# Patient Record
Sex: Female | Born: 1955 | Race: White | Hispanic: No | Marital: Married | Smoking: Former smoker
Health system: Southern US, Community
[De-identification: ages and names within clinical notes are randomized; demographics above are authoritative.]

## PROBLEM LIST (undated history)

## (undated) DIAGNOSIS — I10 Essential (primary) hypertension: Secondary | ICD-10-CM

## (undated) DIAGNOSIS — R011 Cardiac murmur, unspecified: Secondary | ICD-10-CM

## (undated) DIAGNOSIS — F419 Anxiety disorder, unspecified: Secondary | ICD-10-CM

## (undated) DIAGNOSIS — M1990 Unspecified osteoarthritis, unspecified site: Secondary | ICD-10-CM

## (undated) DIAGNOSIS — T7840XA Allergy, unspecified, initial encounter: Secondary | ICD-10-CM

## (undated) DIAGNOSIS — H269 Unspecified cataract: Secondary | ICD-10-CM

## (undated) DIAGNOSIS — I639 Cerebral infarction, unspecified: Secondary | ICD-10-CM

## (undated) DIAGNOSIS — E785 Hyperlipidemia, unspecified: Secondary | ICD-10-CM

## (undated) DIAGNOSIS — G250 Essential tremor: Secondary | ICD-10-CM

## (undated) DIAGNOSIS — K5904 Chronic idiopathic constipation: Secondary | ICD-10-CM

## (undated) HISTORY — DX: Essential tremor: G25.0

## (undated) HISTORY — DX: Anxiety disorder, unspecified: F41.9

## (undated) HISTORY — DX: Unspecified osteoarthritis, unspecified site: M19.90

## (undated) HISTORY — DX: Chronic idiopathic constipation: K59.04

## (undated) HISTORY — DX: Hyperlipidemia, unspecified: E78.5

## (undated) HISTORY — DX: Cardiac murmur, unspecified: R01.1

## (undated) HISTORY — DX: Allergy, unspecified, initial encounter: T78.40XA

## (undated) HISTORY — DX: Cerebral infarction, unspecified: I63.9

## (undated) HISTORY — PX: OTHER SURGICAL HISTORY: SHX169

## (undated) HISTORY — PX: APPENDECTOMY: SHX54

## (undated) HISTORY — DX: Essential (primary) hypertension: I10

## (undated) HISTORY — DX: Unspecified cataract: H26.9

## (undated) HISTORY — PX: UPPER GASTROINTESTINAL ENDOSCOPY: SHX188

---

## 1998-05-28 ENCOUNTER — Other Ambulatory Visit: Admission: RE | Admit: 1998-05-28 | Discharge: 1998-05-28 | Payer: Self-pay | Admitting: Obstetrics and Gynecology

## 1999-08-12 ENCOUNTER — Other Ambulatory Visit: Admission: RE | Admit: 1999-08-12 | Discharge: 1999-08-12 | Payer: Self-pay | Admitting: Obstetrics and Gynecology

## 2009-10-08 ENCOUNTER — Ambulatory Visit (HOSPITAL_COMMUNITY): Admission: EM | Admit: 2009-10-08 | Discharge: 2009-10-09 | Payer: Self-pay | Admitting: Emergency Medicine

## 2009-10-08 ENCOUNTER — Encounter (INDEPENDENT_AMBULATORY_CARE_PROVIDER_SITE_OTHER): Payer: Self-pay

## 2009-12-21 ENCOUNTER — Ambulatory Visit: Payer: Self-pay | Admitting: Internal Medicine

## 2010-03-04 ENCOUNTER — Ambulatory Visit: Payer: Self-pay | Admitting: Internal Medicine

## 2010-03-04 ENCOUNTER — Other Ambulatory Visit: Admission: RE | Admit: 2010-03-04 | Discharge: 2010-03-04 | Payer: Self-pay | Admitting: Internal Medicine

## 2010-03-04 LAB — HM PAP SMEAR

## 2010-03-09 ENCOUNTER — Encounter: Admission: RE | Admit: 2010-03-09 | Discharge: 2010-03-09 | Payer: Self-pay | Admitting: Internal Medicine

## 2010-03-09 LAB — HM MAMMOGRAPHY

## 2010-09-06 LAB — POCT I-STAT, CHEM 8
BUN: 14 mg/dL (ref 6–23)
Calcium, Ion: 1 mmol/L — ABNORMAL LOW (ref 1.12–1.32)
Creatinine, Ser: 0.6 mg/dL (ref 0.4–1.2)
Hemoglobin: 15 g/dL (ref 12.0–15.0)

## 2010-09-06 LAB — DIFFERENTIAL
Basophils Relative: 0 % (ref 0–1)
Lymphocytes Relative: 8 % — ABNORMAL LOW (ref 12–46)
Lymphs Abs: 1.5 10*3/uL (ref 0.7–4.0)
Monocytes Absolute: 0.2 10*3/uL (ref 0.1–1.0)
Monocytes Relative: 1 % — ABNORMAL LOW (ref 3–12)
Neutro Abs: 16 10*3/uL — ABNORMAL HIGH (ref 1.7–7.7)
Neutrophils Relative %: 90 % — ABNORMAL HIGH (ref 43–77)

## 2010-09-06 LAB — BASIC METABOLIC PANEL
CO2: 26 mEq/L (ref 19–32)
Chloride: 107 mEq/L (ref 96–112)
GFR calc Af Amer: >60 mL/min (ref >60)
Potassium: 3.5 mEq/L (ref 3.5–5.1)
Sodium: 141 mEq/L (ref 135–145)

## 2010-09-06 LAB — URINE MICROSCOPIC-ADD ON

## 2010-09-06 LAB — URINALYSIS, ROUTINE W REFLEX MICROSCOPIC
Hgb urine dipstick: NEGATIVE
Ketones, ur: 15 mg/dL — AB
Urobilinogen, UA: 0.2 mg/dL (ref 0.0–1.0)

## 2010-09-06 LAB — CBC
RBC: 4.55 MIL/uL (ref 3.87–5.11)
RDW: 12.9 % (ref 11.5–15.5)

## 2010-09-06 LAB — LACTIC ACID, PLASMA: Lactic Acid, Venous: 1.8 mmol/L (ref 0.5–2.2)

## 2010-09-06 LAB — LIPASE, BLOOD: Lipase: 25 U/L (ref 11–59)

## 2011-08-15 ENCOUNTER — Encounter: Payer: Self-pay | Admitting: Internal Medicine

## 2011-08-15 ENCOUNTER — Ambulatory Visit (INDEPENDENT_AMBULATORY_CARE_PROVIDER_SITE_OTHER): Payer: PRIVATE HEALTH INSURANCE | Admitting: Internal Medicine

## 2011-08-15 VITALS — BP 136/86 | Temp 98.3°F | Wt 144.0 lb

## 2011-08-15 DIAGNOSIS — K5904 Chronic idiopathic constipation: Secondary | ICD-10-CM | POA: Insufficient documentation

## 2011-08-15 DIAGNOSIS — G250 Essential tremor: Secondary | ICD-10-CM | POA: Insufficient documentation

## 2011-08-15 DIAGNOSIS — Z8669 Personal history of other diseases of the nervous system and sense organs: Secondary | ICD-10-CM | POA: Insufficient documentation

## 2011-08-15 DIAGNOSIS — N3090 Cystitis, unspecified without hematuria: Secondary | ICD-10-CM

## 2011-08-15 DIAGNOSIS — R300 Dysuria: Secondary | ICD-10-CM

## 2011-08-15 LAB — POCT URINALYSIS DIPSTICK
Blood, UA: NEGATIVE
Leukocytes, UA: NEGATIVE
Spec Grav, UA: <=1.005
pH, UA: 5

## 2011-08-15 NOTE — Patient Instructions (Signed)
Take Macrobid 100 mg twice daily for 10 days or urinary tract infection.

## 2011-08-15 NOTE — Progress Notes (Signed)
  Subjective:    Patient ID: Tracie Roach, female    DOB: December 22, 1955, 56 y.o.   MRN: [id number]  HPI Onset early last evening of UTI symptoms. Patient had dysuria and pink team Aurea Graff: Paper when she wiped. No fever or chills. No groin or flank pain. Very mild back pain and slight suprapubic pressure. Patient took 2 Macrobid tablets left over from GYN physician last evening. Feels better today. Urinalysis this morning is completely clear. Has not had UTI in several years.    Review of Systems     Objective:   Physical Exam no CVA tenderness. Urine culture not done because patient had had 2 doses of Macrobid.        Assessment & Plan:  Cystitis  Plan: Macrobid 100 mg twice daily for 10 days.

## 2012-06-19 DIAGNOSIS — I639 Cerebral infarction, unspecified: Secondary | ICD-10-CM

## 2012-06-19 HISTORY — DX: Cerebral infarction, unspecified: I63.9

## 2012-06-28 ENCOUNTER — Encounter (HOSPITAL_COMMUNITY): Payer: Self-pay | Admitting: Emergency Medicine

## 2012-06-28 ENCOUNTER — Inpatient Hospital Stay (HOSPITAL_COMMUNITY)
Admission: EM | Admit: 2012-06-28 | Discharge: 2012-06-30 | DRG: 066 | Disposition: A | Discharge status: Home / Self Care | Payer: No Typology Code available for payment source | Attending: Family Medicine | Admitting: Family Medicine

## 2012-06-28 ENCOUNTER — Encounter: Payer: Self-pay | Admitting: Internal Medicine

## 2012-06-28 ENCOUNTER — Ambulatory Visit (INDEPENDENT_AMBULATORY_CARE_PROVIDER_SITE_OTHER): Payer: PRIVATE HEALTH INSURANCE | Admitting: Internal Medicine

## 2012-06-28 ENCOUNTER — Emergency Department (HOSPITAL_COMMUNITY): Payer: No Typology Code available for payment source

## 2012-06-28 VITALS — BP 152/86 | HR 80 | Temp 98.3°F | Wt 154.0 lb

## 2012-06-28 DIAGNOSIS — I6381 Other cerebral infarction due to occlusion or stenosis of small artery: Secondary | ICD-10-CM

## 2012-06-28 DIAGNOSIS — Z79899 Other long term (current) drug therapy: Secondary | ICD-10-CM

## 2012-06-28 DIAGNOSIS — Z87891 Personal history of nicotine dependence: Secondary | ICD-10-CM

## 2012-06-28 DIAGNOSIS — E876 Hypokalemia: Secondary | ICD-10-CM | POA: Diagnosis present

## 2012-06-28 DIAGNOSIS — G8194 Hemiplegia, unspecified affecting left nondominant side: Secondary | ICD-10-CM

## 2012-06-28 DIAGNOSIS — I6350 Cerebral infarction due to unspecified occlusion or stenosis of unspecified cerebral artery: Principal | ICD-10-CM

## 2012-06-28 DIAGNOSIS — Z7982 Long term (current) use of aspirin: Secondary | ICD-10-CM

## 2012-06-28 DIAGNOSIS — Z8669 Personal history of other diseases of the nervous system and sense organs: Secondary | ICD-10-CM

## 2012-06-28 DIAGNOSIS — Z9089 Acquired absence of other organs: Secondary | ICD-10-CM

## 2012-06-28 DIAGNOSIS — G43909 Migraine, unspecified, not intractable, without status migrainosus: Secondary | ICD-10-CM | POA: Diagnosis present

## 2012-06-28 DIAGNOSIS — G8190 Hemiplegia, unspecified affecting unspecified side: Secondary | ICD-10-CM

## 2012-06-28 DIAGNOSIS — E785 Hyperlipidemia, unspecified: Secondary | ICD-10-CM

## 2012-06-28 DIAGNOSIS — I639 Cerebral infarction, unspecified: Secondary | ICD-10-CM

## 2012-06-28 LAB — PROTIME-INR: Prothrombin Time: 12.9 seconds (ref 11.6–15.2)

## 2012-06-28 LAB — GLUCOSE, CAPILLARY: Glucose-Capillary: 112 mg/dL — ABNORMAL HIGH (ref 70–99)

## 2012-06-28 LAB — CBC WITH DIFFERENTIAL/PLATELET
Basophils Relative: 0 % (ref 0–1)
HCT: 39.4 % (ref 36.0–46.0)
Hemoglobin: 12.6 g/dL (ref 12.0–15.0)
Lymphs Abs: 3 10*3/uL (ref 0.7–4.0)
Monocytes Absolute: 0.4 10*3/uL (ref 0.1–1.0)
Neutro Abs: 4.8 10*3/uL (ref 1.7–7.7)
Neutrophils Relative %: 56 % (ref 43–77)
WBC: 8.5 10*3/uL (ref 4.0–10.5)

## 2012-06-28 LAB — BASIC METABOLIC PANEL
Calcium: 9.6 mg/dL (ref 8.4–10.5)
Creatinine, Ser: 0.71 mg/dL (ref 0.50–1.10)
GFR calc Af Amer: >90 mL/min (ref >90)

## 2012-06-28 NOTE — ED Notes (Signed)
Family at bedside. 

## 2012-06-28 NOTE — ED Notes (Signed)
Pt states she has numbness and tingling on her whole left side.

## 2012-06-28 NOTE — ED Notes (Signed)
Patient transported to MRI 

## 2012-06-28 NOTE — ED Provider Notes (Signed)
History     CSN: [id number]  Arrival date & time 06/28/12  1659   First MD Initiated Contact with Patient 06/28/12 2004      Chief Complaint  Patient presents with  . Numbness    (Consider location/radiation/quality/duration/timing/severity/associated sxs/prior treatment) HPI  Pt presents to the ED from Dr. Beryle Quant office for evaluation for a possible CVA. The patient describes a  sensation of numbness and tightness in the left axilla and shoulder. She says that this started 3-4 days  ago. Since then over the past day she feels as though she has been having left lower tremor the  numbness and tingling of well with some mild weakness. The patient tells me that she is able to work and  is ambulatory. She says "I'm able to get down everything that I need to get done" but that the numbness  and tingling with weakness has her very concerned. She has a history of smoking for which she quit  recently. Patient also has a past medical history of migraines but denies currently having any pain  anywhere or headache. She's afebrile with no temperature. Her vital signs are stable and she is in no  acute distress.  Past Medical History  Diagnosis Date  . Headache   . Functional constipation   . Hot flashes   . Postmenopausal   . Essential tremor     Past Surgical History  Procedure Date  . Appendectomy     Family History  Problem Relation Age of Onset  . Heart disease Mother     History  Substance Use Topics  . Smoking status: Former Smoker -- 0.3 packs/day    Quit date: 08/27/2011  . Smokeless tobacco: Never Used  . Alcohol Use: No    OB History    Grav Para Term Preterm Abortions TAB SAB Ect Mult Living                  Review of Systems  Review of Systems  Gen: no weight loss, fevers, chills, night sweats  Eyes: no discharge or drainage, no occular pain or visual changes  Nose: no epistaxis or rhinorrhea  Mouth: no dental pain, no sore throat  Neck: no neck  pain  Lungs:No wheezing, coughing or hemoptysis CV: no chest pain, palpitations, dependent edema or orthopnea  Abd: no abdominal pain, nausea, vomiting  GU: no dysuria or gross hematuria  MSK:  No abnormalities  Neuro: + headaches, + weakness and numbness on left side of body Skin: no abnormalities Psyche: negative.   Allergies  Review of patient's allergies indicates no known allergies.  Home Medications   Current Outpatient Rx  Name  Route  Sig  Dispense  Refill  . ASPIRIN-ACETAMINOPHEN-CAFFEINE 250-250-65 MG PO TABS   Oral   Take 1 tablet by mouth every 6 (six) hours as needed. For pain         . BUTALBITAL-APAP-CAFFEINE 50-325-40 MG PO TABS   Oral   Take 1 tablet by mouth 2 (two) times daily as needed. For migraines         . MULTI-VITAMIN/MINERALS PO TABS   Oral   Take 1 tablet by mouth daily.           BP 145/80  Pulse 78  Temp 98.4 F (36.9 C) (Oral)  Resp 14  SpO2 100%  Physical Exam  Nursing note and vitals reviewed. Constitutional: She is oriented to person, place, and time. She appears well-developed and well-nourished. No distress.  HENT:  Head: Normocephalic and atraumatic.  Eyes: Pupils are equal, round, and reactive to light.  Neck: Normal range of motion. Neck supple.  Cardiovascular: Normal rate and regular rhythm.   Pulmonary/Chest: Effort normal.  Abdominal: Soft.  Neurological: She is alert and oriented to person, place, and time. No cranial nerve deficit or sensory deficit. Gait normal.       Strength is symmetrical except for very mild weakness noted in left leg.  Skin: Skin is warm and dry.    ED Course  Procedures (including critical care time)  Labs Reviewed  GLUCOSE, CAPILLARY - Abnormal; Notable for the following:    Glucose-Capillary 112 (*)     All other components within normal limits  BASIC METABOLIC PANEL - Abnormal; Notable for the following:    Potassium 3.4 (*)     All other components within normal limits  CBC  WITH DIFFERENTIAL  PROTIME-INR   Mr Shirlee Latch Wo Contrast  06/28/2012  *RADIOLOGY REPORT*  Clinical Data:  57 year old female with numbness, left side hemipareses.  Left face numbness progressing to left hand numbness times 3 days.  Comparison: None.  MRI HEAD WITHOUT CONTRAST  Technique: Multiplanar, multiecho pulse sequences of the brain and surrounding structures were obtained according to standard protocol without intravenous contrast.  Findings: Linear 6 mm focus of restricted diffusion at the lateral aspect of the right thalamus near the posterior limb right internal capsule.  Associated T2 and FLAIR hyperintensity.  No mass effect or hemorrhage.  Major intracranial vascular flow voids are preserved, MRA findings are below.  [name] Normal cerebral volume.  No ventriculomegaly.  Negative pituitary, cervicomedullary junction and visualized cervical spine. Visualized bone marrow signal is within normal limits.  Mild paranasal sinus mucosal thickening. Visualized orbit soft tissues are within normal limits.  Mastoids are clear.  Negative scalp soft tissues.  IMPRESSION: 1.  Acute lacunar infarct lateral right thalamus near the posterior limb right internal capsule.  No mass effect or hemorrhage. 2. Otherwise negative noncontrast brain MRI. 3.  MRA findings are below.  MRA HEAD WITHOUT CONTRAST  Technique: Angiographic images of the Circle of Willis were obtained using MRA technique without  intravenous contrast.  Findings: Antegrade flow in the posterior circulation codominant distal vertebral arteries.  No distal vertebral artery stenosis. Normal bilateral PICA vessels.  Patent vertebrobasilar junction. Normal basilar artery.  SCA and PCA origins are normal.  Posterior communicating arteries are diminutive or absent.  Right PCA P1 and P2 segments are within normal limits.  Bilateral PCA branches are within normal limits.  Antegrade flow in both ICA  siphons.  Mild irregularity of the distal cervical right ICA.  No ICA siphon stenosis.  Normal ophthalmic artery origins.  Normal carotid termini, MCA and ACA origins.  Anterior communicating artery diminutive or absent.  Visualized ACA branches are within normal limits.  Visualized bilateral MCA branches are within normal limits.  IMPRESSION: 1. Negative intracranial MRA. 2.  Irregularity of the visible distal cervical right ICA which may represent atherosclerosis.   Original Report Authenticated By: Erskine Speed, M.D.    Mr Brain Wo Contrast  06/28/2012  *RADIOLOGY REPORT*  Clinical Data:  57 year old female with numbness, left side hemipareses.  Left face numbness progressing to left hand numbness times 3 days.  Comparison: None.  MRI HEAD WITHOUT CONTRAST  Technique: Multiplanar, multiecho pulse sequences of the brain and surrounding structures were obtained according to standard protocol without intravenous contrast.  Findings:  Linear 6 mm focus of restricted diffusion at the lateral aspect of the right thalamus near the posterior limb right internal capsule.  Associated T2 and FLAIR hyperintensity.  No mass effect or hemorrhage.  Major intracranial vascular flow voids are preserved, MRA findings are below.  [name] Normal cerebral volume.  No ventriculomegaly.  Negative pituitary, cervicomedullary junction and visualized cervical spine. Visualized bone marrow signal is within normal limits.  Mild paranasal sinus mucosal thickening. Visualized orbit soft tissues are within normal limits.  Mastoids are clear.  Negative scalp soft tissues.  IMPRESSION: 1.  Acute lacunar infarct lateral right thalamus near the posterior limb right internal capsule.  No mass effect or hemorrhage. 2. Otherwise negative noncontrast brain MRI. 3.  MRA findings are below.  MRA HEAD WITHOUT CONTRAST  Technique: Angiographic images of the Circle of Willis were obtained using MRA  technique without  intravenous contrast.  Findings: Antegrade flow in the posterior circulation codominant distal vertebral arteries.  No distal vertebral artery stenosis. Normal bilateral PICA vessels.  Patent vertebrobasilar junction. Normal basilar artery.  SCA and PCA origins are normal.  Posterior communicating arteries are diminutive or absent.  Right PCA P1 and P2 segments are within normal limits.  Bilateral PCA branches are within normal limits.  Antegrade flow in both ICA siphons.  Mild irregularity of the distal cervical right ICA.  No ICA siphon stenosis.  Normal ophthalmic artery origins.  Normal carotid termini, MCA and ACA origins.  Anterior communicating artery diminutive or absent.  Visualized ACA branches are within normal limits.  Visualized bilateral MCA branches are within normal limits.  IMPRESSION: 1. Negative intracranial MRA. 2.  Irregularity of the visible distal cervical right ICA which may represent atherosclerosis.   Original Report Authenticated By: Erskine Speed, M.D.      1. Lacunar infarct, acute       MDM  After discussing case with Dr. Oletta Lamas, MR brain wo contrast ordered to r/o cva or other emergent intracranial pathology. Symptoms have persisted to 3-4 days.  MRI shows a small lacunar infarct of the brain. Pt admitted to [facility] and neurology to consult. I have told the family the results and that she is to be admitted.      Dorthula Matas, PA 06/28/12 2322

## 2012-06-28 NOTE — Progress Notes (Addendum)
Subjective:    Patient ID: Tracie Roach, female    DOB: 1955-09-20, 57 y.o.   MRN: [id number]  HPI Pt had onset of URI symptoms on Christmas Eve. Had cough and congestion. Pt. Took Sudafed and slowly improved. Pt has history of migraine headaches. Is seen here infrequently with no recent exam or lab work. Around [place] had bad headache and persisted for 2-3 days for which she took Excedrin Migraine. History of present illness is a bit vague but maybe on 1/6 pt noted numbness of LUE intermittent with hand numbness and arm numbness. Face began to tingle 2 days ago. Left sided facial numbness. Pt c/o numbness and tightness in left axilla and shoulder. Also has LLE numbness that started 06/25/12. Mother is in [facility]  with dehydration, bronchitis and Protime abnormality on Coumadin.  No documented fever or chills. Has hot flashes from menopause. No visual disturbance. No sore throat. Took flu vaccine at [facility] end of October.   No known drug allergies. Takes occasional Fioricet and Excedrin migraine. History of constipation. History of essential tremor.  Had appendectomy April 2011. Last mammogram on file October 2011. Had Pap smear which was normal September 2011. History of moderate hyperlipidemia with LDL cholesterol 140 02/23/2010 with total cholesterol 213 HDL cholesterol 47 triglycerides of 93. TSH was normal at that time.  History of occasional urinary tract infections.  Family history: Father died in 11-10-2007 with pulmonary fibrosis. Mother living with history of CABG and valve replacement. One sister in good health. No brothers.  Social history: Married   with 2 adult children  Does not smoke or consume alcohol. Quit smoking March 2013. Used to smoke 6 or 7 cigarettes daily.  Has been a patient in this practice since March 2000 that is seen infrequently. No history of serious illnesses or accidents.    Review of Systems  Constitutional:       Calm alert and oriented    HENT: Positive for congestion.   Eyes: Negative.   Gastrointestinal:       Constipation  Genitourinary: Negative.   Musculoskeletal: Positive for arthralgias.  Neurological: Positive for weakness, numbness and headaches.  Psychiatric/Behavioral: Negative.        Objective:   Physical Exam  Vitals reviewed. Constitutional: She is oriented to person, place, and time. She appears well-developed and well-nourished. No distress.  HENT:  Head: Normocephalic and atraumatic.  Right Ear: External ear normal.  Left Ear: External ear normal.  Mouth/Throat: Oropharynx is clear and moist. No oropharyngeal exudate.       Bilateral lenticular densities. Fundi not well seen. EOM's full PERLA  Eyes: Conjunctivae normal and EOM are normal. Pupils are equal, round, and reactive to light. Right eye exhibits no discharge. Left eye exhibits no discharge. No scleral icterus.  Neck: Neck supple. No JVD present. No thyromegaly present.  Cardiovascular: Normal rate, regular rhythm, normal heart sounds and intact distal pulses.   No murmur heard. Pulmonary/Chest: No respiratory distress. She has no wheezes. She has no rales. She exhibits no tenderness.       Breasts normal female.  Abdominal: Soft. Bowel sounds are normal. She exhibits no distension and no mass. There is no tenderness. There is no rebound and no guarding.  Musculoskeletal: Normal range of motion. She exhibits no edema.  Lymphadenopathy:    She has no cervical adenopathy.  Neurological: She is alert and oriented to person, place, and time. She has normal reflexes. No cranial nerve deficit.  Skin: Skin is warm and dry. She is not diaphoretic.  Psychiatric: She has a normal mood and affect. Her behavior is normal. Judgment and thought content normal.          Assessment & Plan:  Left sided weakness including LLE, left trunk, LUE and left face- possible CVA Symtoms started several days ago and have progressed to involve LLE History of  migraine headaches  Plan: To ED for further evaluation by Neurohospitalist and imaging studies. No labs drawn here today.  Patient diagnosed in hospital with right thalamic stroke. Was admitted for evaluation. Had LDL of 132. Normal hemoglobin A1c. Patient advised to followup with me in 2 weeks. She also has an appointment to followup with Dr. Pearlean Brownie in 2 months.

## 2012-06-28 NOTE — Patient Instructions (Addendum)
Proceed to emergency department for immediate evaluation. 

## 2012-06-28 NOTE — ED Notes (Signed)
Pt sent to ED from Dr. Beryle Quant office with c/o weakness and numbness in left side of face, trunk and left lower leg.  Pt has hx of migraines and st's headache comes and goes.  Pt alert and oriented x's 3. Speech clear.

## 2012-06-28 NOTE — ED Provider Notes (Signed)
Medical screening examination/treatment/procedure(s) were conducted as a shared visit with non-physician practitioner(s) and myself.  I personally evaluated the patient during the encounter   Pt with 3-4 days of HA.  MRI shows lacunar infarct.  Not a TPA candidate due to mild symptoms, several days prior to onset.  Will need admission for stroke eval and neurology consultation.  Airway intact.    Gavin Pound. Oletta Lamas, MD 06/28/12 2324

## 2012-06-29 ENCOUNTER — Encounter (HOSPITAL_COMMUNITY): Payer: Self-pay | Admitting: *Deleted

## 2012-06-29 DIAGNOSIS — I6789 Other cerebrovascular disease: Secondary | ICD-10-CM

## 2012-06-29 DIAGNOSIS — E785 Hyperlipidemia, unspecified: Secondary | ICD-10-CM

## 2012-06-29 LAB — LIPID PANEL
HDL: 43 mg/dL (ref >39)
LDL Cholesterol: 136 mg/dL — ABNORMAL HIGH (ref 0–99)
Total CHOL/HDL Ratio: 4.7 RATIO
Triglycerides: 112 mg/dL (ref <150)

## 2012-06-29 LAB — CBC
HCT: 39.4 % (ref 36.0–46.0)
MCHC: 33.5 g/dL (ref 30.0–36.0)
Platelets: 354 10*3/uL (ref 150–400)
RDW: 12.7 % (ref 11.5–15.5)
WBC: 9.6 10*3/uL (ref 4.0–10.5)

## 2012-06-29 LAB — CREATININE, SERUM: GFR calc non Af Amer: >90 mL/min (ref >90)

## 2012-06-29 LAB — HEMOGLOBIN A1C: Hgb A1c MFr Bld: 5.6 % (ref <5.7)

## 2012-06-29 MED ORDER — ACETAMINOPHEN 325 MG PO TABS
650.0000 mg | ORAL_TABLET | Freq: Four times a day (QID) | ORAL | Status: DC | PRN
  Administered 2012-06-29: 650 mg via ORAL
  Filled 2012-06-29: qty 2

## 2012-06-29 MED ORDER — FAMOTIDINE 20 MG PO TABS
20.0000 mg | ORAL_TABLET | Freq: Every day | ORAL | Status: DC
  Administered 2012-06-29: 20 mg via ORAL
  Filled 2012-06-29 (×2): qty 1

## 2012-06-29 MED ORDER — POTASSIUM CHLORIDE CRYS ER 20 MEQ PO TBCR
20.0000 meq | EXTENDED_RELEASE_TABLET | Freq: Two times a day (BID) | ORAL | Status: DC
  Administered 2012-06-29 – 2012-06-30 (×3): 20 meq via ORAL
  Filled 2012-06-29 (×5): qty 1

## 2012-06-29 MED ORDER — ASPIRIN EC 325 MG PO TBEC
325.0000 mg | DELAYED_RELEASE_TABLET | Freq: Every day | ORAL | Status: DC
  Administered 2012-06-29 – 2012-06-30 (×2): 325 mg via ORAL
  Filled 2012-06-29 (×2): qty 1

## 2012-06-29 MED ORDER — HEPARIN SODIUM (PORCINE) 5000 UNIT/ML IJ SOLN
5000.0000 [IU] | Freq: Three times a day (TID) | INTRAMUSCULAR | Status: DC
  Administered 2012-06-29 – 2012-06-30 (×3): 5000 [IU] via SUBCUTANEOUS
  Filled 2012-06-29 (×7): qty 1

## 2012-06-29 MED ORDER — SIMVASTATIN 20 MG PO TABS
20.0000 mg | ORAL_TABLET | Freq: Every day | ORAL | Status: DC
  Administered 2012-06-29: 20 mg via ORAL
  Filled 2012-06-29 (×2): qty 1

## 2012-06-29 MED ORDER — PANTOPRAZOLE SODIUM 40 MG PO TBEC
40.0000 mg | DELAYED_RELEASE_TABLET | Freq: Every day | ORAL | Status: DC
  Administered 2012-06-29 – 2012-06-30 (×2): 40 mg via ORAL
  Filled 2012-06-29 (×2): qty 1

## 2012-06-29 MED ORDER — MULTI-VITAMIN/MINERALS PO TABS: 1.0000 | ORAL_TABLET | Freq: Every day | ORAL | Status: DC

## 2012-06-29 MED ORDER — ADULT MULTIVITAMIN W/MINERALS CH
1.0000 | ORAL_TABLET | Freq: Every day | ORAL | Status: DC
  Administered 2012-06-29 – 2012-06-30 (×2): 1 via ORAL
  Filled 2012-06-29 (×2): qty 1

## 2012-06-29 MED ORDER — HYDROCODONE-ACETAMINOPHEN 5-325 MG PO TABS
1.0000 | ORAL_TABLET | Freq: Once | ORAL | Status: AC
  Administered 2012-06-29: 1 via ORAL
  Filled 2012-06-29: qty 1

## 2012-06-29 MED ORDER — POTASSIUM CHLORIDE CRYS ER 20 MEQ PO TBCR
20.0000 meq | EXTENDED_RELEASE_TABLET | Freq: Two times a day (BID) | ORAL | Status: DC
  Filled 2012-06-29 (×3): qty 1

## 2012-06-29 MED ORDER — POLYETHYLENE GLYCOL 3350 17 G PO PACK
17.0000 g | PACK | Freq: Every day | ORAL | Status: DC
  Administered 2012-06-29 – 2012-06-30 (×2): 17 g via ORAL
  Filled 2012-06-29 (×2): qty 1

## 2012-06-29 MED ORDER — DOCUSATE SODIUM 100 MG PO CAPS
100.0000 mg | ORAL_CAPSULE | Freq: Two times a day (BID) | ORAL | Status: DC
  Administered 2012-06-29 – 2012-06-30 (×2): 100 mg via ORAL

## 2012-06-29 NOTE — H&P (Signed)
[facility] History and Physical  Tracie Roach WJX:[id number] DOB: [birth year] DOA: 06/28/2012  Referring physician: ED PCP: Margaree Mackintosh, MD  Specialists: None  Chief Complaint: Numbness  HPI: Tracie Roach is a 57 y.o. female who presents to the ED with L sided numbness going on now for the past 3-4 days.  Initially waxing and waning but has been fixed and persistent over the past 2 days.  Because of its persistence she presented to the ED.  No weakness, vision change, or any other symptoms with this.  In the ED MRI was performed which demonstrated a small thalamic infarct, hospitalist has been asked to admit for stroke.  Review of Systems: 12 systems reviewed and negative  Past Medical History  Diagnosis Date  . Headache   . Functional constipation   . Hot flashes   . Postmenopausal   . Essential tremor    Past Surgical History  Procedure Date  . Appendectomy    Social History:  reports that she quit smoking about 10 months ago. She has never used smokeless tobacco. She reports that she does not drink alcohol or use illicit drugs. In total has only about a 6-7 pack year history of smoking.  No Known Allergies  Family History  Problem Relation Age of Onset  . Heart disease Mother      Prior to Admission medications   Medication Sig Start Date End Date Taking? Authorizing Provider  aspirin-acetaminophen-caffeine (EXCEDRIN MIGRAINE) [phone] MG per tablet Take 1 tablet by mouth every 6 (six) hours as needed. For pain   Yes Historical Provider, MD  butalbital-acetaminophen-caffeine (FIORICET, ESGIC) 50-325-40 MG per tablet Take 1 tablet by mouth 2 (two) times daily as needed. For migraines   Yes Historical Provider, MD  Multiple Vitamins-Minerals (MULTIVITAMIN WITH MINERALS) tablet Take 1 tablet by mouth daily.   Yes Historical Provider, MD   Physical Exam: Filed Vitals:   06/29/12 0115 06/29/12 0132 06/29/12 0133 06/29/12 0133  BP: 135/65  130/70   Pulse: 75  75 68   Temp:    97.5 F (36.4 C)  TempSrc:      Resp: 16     SpO2: 100% 100% 100%     General:  NAD, resting comfortably in bed Eyes: PEERLA EOMI ENT: mucous membranes moist Neck: supple w/o JVD Cardiovascular: RRR w/o MRG Respiratory: CTA B Abdomen: soft, nt, nd, bs+ Skin: no rash nor lesion Musculoskeletal: MAE, full ROM all 4 extremities Psychiatric: normal tone and affect Neurologic: AAOx3, sensation diminished in the L side (face, arm, and leg) compared to the R, otherwise exam is non-focal  Labs on Admission:  Basic Metabolic Panel:  Lab 06/28/12 8469  NA 142  K 3.4*  CL 103  CO2 27  GLUCOSE 85  BUN 9  CREATININE 0.71  CALCIUM 9.6  MG --  PHOS --   Liver Function Tests: No results found for this basename: AST:5,ALT:5,ALKPHOS:5,BILITOT:5,PROT:5,ALBUMIN:5 in the last 168 hours No results found for this basename: LIPASE:5,AMYLASE:5 in the last 168 hours No results found for this basename: AMMONIA:5 in the last 168 hours CBC:  Lab 06/28/12 2054  WBC 8.5  NEUTROABS 4.8  HGB 12.6  HCT 39.4  MCV 90.0  PLT 346   Cardiac Enzymes: No results found for this basename: CKTOTAL:5,CKMB:5,CKMBINDEX:5,TROPONINI:5 in the last 168 hours  BNP (last 3 results) No results found for this basename: PROBNP:3 in the last 8760 hours CBG:  Lab 06/28/12 1853  GLUCAP 112*    Radiological Exams on Admission:  Mr Shirlee Latch Wo Contrast  06/28/2012  *RADIOLOGY REPORT*  Clinical Data:  57 year old female with numbness, left side hemipareses.  Left face numbness progressing to left hand numbness times 3 days.  Comparison: None.  MRI HEAD WITHOUT CONTRAST  Technique: Multiplanar, multiecho pulse sequences of the brain and surrounding structures were obtained according to standard protocol without intravenous contrast.  Findings: Linear 6 mm focus of restricted diffusion at the lateral aspect of the right thalamus near the posterior limb right internal capsule.  Associated T2 and FLAIR  hyperintensity.  No mass effect or hemorrhage.  Major intracranial vascular flow voids are preserved, MRA findings are below.  [name] Normal cerebral volume.  No ventriculomegaly.  Negative pituitary, cervicomedullary junction and visualized cervical spine. Visualized bone marrow signal is within normal limits.  Mild paranasal sinus mucosal thickening. Visualized orbit soft tissues are within normal limits.  Mastoids are clear.  Negative scalp soft tissues.  IMPRESSION: 1.  Acute lacunar infarct lateral right thalamus near the posterior limb right internal capsule.  No mass effect or hemorrhage. 2. Otherwise negative noncontrast brain MRI. 3.  MRA findings are below.  MRA HEAD WITHOUT CONTRAST  Technique: Angiographic images of the Circle of Willis were obtained using MRA technique without  intravenous contrast.  Findings: Antegrade flow in the posterior circulation codominant distal vertebral arteries.  No distal vertebral artery stenosis. Normal bilateral PICA vessels.  Patent vertebrobasilar junction. Normal basilar artery.  SCA and PCA origins are normal.  Posterior communicating arteries are diminutive or absent.  Right PCA P1 and P2 segments are within normal limits.  Bilateral PCA branches are within normal limits.  Antegrade flow in both ICA siphons.  Mild irregularity of the distal cervical right ICA.  No ICA siphon stenosis.  Normal ophthalmic artery origins.  Normal carotid termini, MCA and ACA origins.  Anterior communicating artery diminutive or absent.  Visualized ACA branches are within normal limits.  Visualized bilateral MCA branches are within normal limits.  IMPRESSION: 1. Negative intracranial MRA. 2.  Irregularity of the visible distal cervical right ICA which may represent atherosclerosis.   Original Report Authenticated By: Erskine Speed, M.D.    Mr Brain Wo Contrast  06/28/2012  *RADIOLOGY REPORT*  Clinical Data:  57 year old female  with numbness, left side hemipareses.  Left face numbness progressing to left hand numbness times 3 days.  Comparison: None.  MRI HEAD WITHOUT CONTRAST  Technique: Multiplanar, multiecho pulse sequences of the brain and surrounding structures were obtained according to standard protocol without intravenous contrast.  Findings: Linear 6 mm focus of restricted diffusion at the lateral aspect of the right thalamus near the posterior limb right internal capsule.  Associated T2 and FLAIR hyperintensity.  No mass effect or hemorrhage.  Major intracranial vascular flow voids are preserved, MRA findings are below.  [name] Normal cerebral volume.  No ventriculomegaly.  Negative pituitary, cervicomedullary junction and visualized cervical spine. Visualized bone marrow signal is within normal limits.  Mild paranasal sinus mucosal thickening. Visualized orbit soft tissues are within normal limits.  Mastoids are clear.  Negative scalp soft tissues.  IMPRESSION: 1.  Acute lacunar infarct lateral right thalamus near the posterior limb right internal capsule.  No mass effect or hemorrhage. 2. Otherwise negative noncontrast brain MRI. 3.  MRA findings are below.  MRA HEAD WITHOUT CONTRAST  Technique: Angiographic images of the Circle of Willis were obtained using MRA  technique without  intravenous contrast.  Findings: Antegrade flow in the posterior circulation codominant distal vertebral arteries.  No distal vertebral artery stenosis. Normal bilateral PICA vessels.  Patent vertebrobasilar junction. Normal basilar artery.  SCA and PCA origins are normal.  Posterior communicating arteries are diminutive or absent.  Right PCA P1 and P2 segments are within normal limits.  Bilateral PCA branches are within normal limits.  Antegrade flow in both ICA siphons.  Mild irregularity of the distal cervical right ICA.  No ICA siphon stenosis.  Normal ophthalmic artery origins.  Normal  carotid termini, MCA and ACA origins.  Anterior communicating artery diminutive or absent.  Visualized ACA branches are within normal limits.  Visualized bilateral MCA branches are within normal limits.  IMPRESSION: 1. Negative intracranial MRA. 2.  Irregularity of the visible distal cervical right ICA which may represent atherosclerosis.   Original Report Authenticated By: Erskine Speed, M.D.     EKG: Independently reviewed.  Assessment/Plan Principal Problem:  *Stroke   1. Stroke - very small thalamic lacunar infarct causing L sided numbness and no other symptoms, MRI/MRA performed, ordered carotid dopplers, 2d echo, neurology has seen patient, she is now on ASA, on tele monitor during stay.  PT/OT/SLP ordered as per routine but it is unlikely that the patient will require any sort of long term rehab or therapy given the lack of severity of symptoms with this stroke.  Lipid profile pending for AM, start statin if high. 2. Hypokalemia - K 3.4 will go ahead and give 20 meq PO KCl  Dr. Amada Jupiter has seen the patient in consultation for neurology, his recs are in chart.  Code Status: Full code (must indicate code status--if unknown or must be presumed, indicate so) Family Communication: Spoke with husband at bedside (indicate person spoken with, if applicable, with phone number if by telephone) Disposition Plan: Admit to inpatient (indicate anticipated LOS)  Time spent: 70 min  GARDNER, JARED M. [facility] Pager [phone]  If 7PM-7AM, please contact night-coverage [email or web address] Password [facility] 06/29/2012, 2:39 AM

## 2012-06-29 NOTE — Progress Notes (Signed)
VASCULAR LAB PRELIMINARY  PRELIMINARY  PRELIMINARY  PRELIMINARY  Carotid Dopplers completed.    Preliminary report:  There is no ICA stenosis.  Vertebral artery flow is antegrade.  Tracie Roach, RVT 06/29/2012, 11:57 AM

## 2012-06-29 NOTE — Progress Notes (Signed)
  Echocardiogram 2D Echocardiogram has been performed.  Tracie Roach FRANCES 06/29/2012, 12:01 PM

## 2012-06-29 NOTE — Consult Note (Signed)
Reason for Consult:Numbness Referring Physician: Laban Emperor  CC: Numbness  History is obtained from:PAtient, husband  HPI: Tracie Roach is a 57 y.o. female with a hsitory of numbness going on for 3-4 days. She states taht it initially was waxing and waning, but over the past two days, it has been fixed. She denies weakness, vision change, or other symptoms. She got an MRI showing small thalamic infarct.   LKW: several days ago tpa given: no, out of window.    ROS: A 14 point ROS was performed and is negative except as noted in the HPI.  Past Medical History  Diagnosis Date  . Headache   . Functional constipation   . Hot flashes   . Postmenopausal   . Essential tremor     Family History: No history of CVA  Social History: Tob: quit last march  Exam: Current vital signs: BP 130/70  Pulse 68  Temp 97.5 F (36.4 C) (Oral)  Resp 16  SpO2 100% Vital signs in last 24 hours: Temp:  [97.5 F (36.4 C)-98.4 F (36.9 C)] 97.5 F (36.4 C) (01/11 0133) Pulse Rate:  [68-85] 68  (01/11 0133) Resp:  [10-18] 16  (01/11 0115) BP: (123-152)/(59-86) 130/70 mmHg (01/11 0133) SpO2:  [96 %-100 %] 100 % (01/11 0133) Weight:  [69.854 kg (154 lb)] 69.854 kg (154 lb) (01/10 1537)  General: in bed, NAD CV: RRR Mental Status: Patient is awake, alert, oriented to person, place, month, year, and situation. Immediate and remote memory are intact. Patient is able to give a clear and coherent history. Able to spell world backwards without difficulty Able to give # of quarters in $2.75 Cranial Nerves: II: Visual Fields are full. Pupils are equal, round, and reactive to light.  Discs are difficult to visualize. III,IV, VI: EOMI without ptosis or diploplia.  V: Facial sensation is symmetric to temperature VII: Facial movement is symmetric.  VIII: hearing is intact to voice X: Uvula elevates symmetrically XI: Shoulder shrug is symmetric. XII: tongue is midline without atrophy or  fasciculations.  Motor: Tone is normal. Bulk is normal. 5/5 strength was present in all four extremities.  Sensory: Sensation is diminished in the left face arma dn leg compared to right.  Deep Tendon Reflexes: 2+ and symmetric in the biceps and patellae.  Cerebellar: FNF  intact bilaterally Gait: Did not test due to multiple monitors in ER setting.   I have reviewed labs in epic and the results pertinent to this consultation are: BMP - mild hypo K CBC - unremarkable  I have reviewed the images obtained:MRI brain - small thalamic infarct  Impression: 57 yo F with small thalamic infarct, likely small vessle etiology.   Recommendations: 1. HgbA1c, fasting lipid panel 2. PT consult, OT consult 3. Echocardiogram 4. Carotid dopplers 5. Prophylactic therapy-Antiplatelet med: Aspirin - dose 325mg  6. Risk factor modification 7. Telemetry monitoring 8. Frequent neuro checks   Ritta Slot, MD [facility] [phone]  If 7pm- 7am, please page neurology on call at [phone].

## 2012-06-29 NOTE — Progress Notes (Signed)
[facility] PROGRESS NOTE  Tracie Roach ZOX:[id number] DOB: Sep 05, 1955 DOA: 06/28/2012 PCP: Margaree Mackintosh, MD  Assessment/Plan: 1. Small thalamic infarct: Likely small vessel etiology. Aspirin 325 mg daily per neurology. Statin. EKG sinus rhythm. 2. Hyperlipidemia: Statin.  Code Status: Full code Family Communication: Discussed with family at bedside Disposition Plan: Home when worker complete, likely 1/12.  Brendia Sacks, MD  [facility] Team 4  Pager [phone] If 7PM-7AM, please contact night-coverage at [email or web address], password [facility] 06/29/2012, 11:42 AM  LOS: 1 day   Brief narrative: 57 y.o. female with a hsitory of numbness going on for 3-4 days. She states that it initially was waxing and waning, but over the past two days, it has been fixed. She got an MRI showing small thalamic infarct.  LKW: several days ago  tpa given: no, out of window.   Consultants:  Neurology  Procedures:  Bilateral carotid Dopplers: No ICA stenosis. Vertebral artery flow antegrade.  2-D echocardiogram:  HPI/Subjective: Afebrile, vital signs stable. Numbness persists left face, left arm, left leg.  Objective: Filed Vitals:   06/29/12 0133 06/29/12 0133 06/29/12 0601 06/29/12 0952  BP: 130/70  129/57 131/58  Pulse: 68  72 61  Temp:  97.5 F (36.4 C) 97.4 F (36.3 C) 98.2 F (36.8 C)  TempSrc:   Oral Oral  Resp:   18 17  SpO2: 100%  98% 100%   No intake or output data in the 24 hours ending 06/29/12 1142 There were no vitals filed for this visit.  Exam:  General:  Appears calm and comfortable Eyes: PERRL, normal lids, irises ENT: grossly normal hearing, lips  Cardiovascular: RRR, no m/r/g. No LE edema. Telemetry: SR, no arrhythmias  Respiratory: CTA bilaterally, no w/r/r. Normal respiratory effort. Musculoskeletal: grossly normal tone BUE/BLE Psychiatric: grossly normal mood and affect, speech fluent and appropriate Neurologic: As above  Data Reviewed: Basic  Metabolic Panel:  Lab 06/29/12 8295 06/28/12 2054  NA -- 142  K -- 3.4*  CL -- 103  CO2 -- 27  GLUCOSE -- 85  BUN -- 9  CREATININE 0.69 0.71  CALCIUM -- 9.6  MG -- --  PHOS -- --   CBC:  Lab 06/29/12 0242 06/28/12 2054  WBC 9.6 8.5  NEUTROABS -- 4.8  HGB 13.2 12.6  HCT 39.4 39.4  MCV 90.0 90.0  PLT 354 346   CBG:  Lab 06/28/12 1853  GLUCAP 112*     Studies: Mr Lucretia Kern Contrast  06/28/2012  *RADIOLOGY REPORT*  Clinical Data:  57 year old female with numbness, left side hemipareses.  Left face numbness progressing to left hand numbness times 3 days.  Comparison: None.  MRI HEAD WITHOUT CONTRAST  Technique: Multiplanar, multiecho pulse sequences of the brain and surrounding structures were obtained according to standard protocol without intravenous contrast.  Findings: Linear 6 mm focus of restricted diffusion at the lateral aspect of the right thalamus near the posterior limb right internal capsule.  Associated T2 and FLAIR hyperintensity.  No mass effect or hemorrhage.  Major intracranial vascular flow voids are preserved, MRA findings are below.  [name] Normal cerebral volume.  No ventriculomegaly.  Negative pituitary, cervicomedullary junction and visualized cervical spine. Visualized bone marrow signal is within normal limits.  Mild paranasal sinus mucosal thickening. Visualized orbit soft tissues are within normal limits.  Mastoids are clear.  Negative scalp soft tissues.  IMPRESSION: 1.  Acute lacunar infarct lateral right thalamus near the posterior limb  right internal capsule.  No mass effect or hemorrhage. 2. Otherwise negative noncontrast brain MRI. 3.  MRA findings are below.  MRA HEAD WITHOUT CONTRAST  Technique: Angiographic images of the Circle of Willis were obtained using MRA technique without  intravenous contrast.  Findings: Antegrade flow in the posterior circulation codominant distal vertebral arteries.   No distal vertebral artery stenosis. Normal bilateral PICA vessels.  Patent vertebrobasilar junction. Normal basilar artery.  SCA and PCA origins are normal.  Posterior communicating arteries are diminutive or absent.  Right PCA P1 and P2 segments are within normal limits.  Bilateral PCA branches are within normal limits.  Antegrade flow in both ICA siphons.  Mild irregularity of the distal cervical right ICA.  No ICA siphon stenosis.  Normal ophthalmic artery origins.  Normal carotid termini, MCA and ACA origins.  Anterior communicating artery diminutive or absent.  Visualized ACA branches are within normal limits.  Visualized bilateral MCA branches are within normal limits.  IMPRESSION: 1. Negative intracranial MRA. 2.  Irregularity of the visible distal cervical right ICA which may represent atherosclerosis.   Original Report Authenticated By: Erskine Speed, M.D.    Mr Brain Wo Contrast  06/28/2012  *RADIOLOGY REPORT*  Clinical Data:  57 year old female with numbness, left side hemipareses.  Left face numbness progressing to left hand numbness times 3 days.  Comparison: None.  MRI HEAD WITHOUT CONTRAST  Technique: Multiplanar, multiecho pulse sequences of the brain and surrounding structures were obtained according to standard protocol without intravenous contrast.  Findings: Linear 6 mm focus of restricted diffusion at the lateral aspect of the right thalamus near the posterior limb right internal capsule.  Associated T2 and FLAIR hyperintensity.  No mass effect or hemorrhage.  Major intracranial vascular flow voids are preserved, MRA findings are below.  [name] Normal cerebral volume.  No ventriculomegaly.  Negative pituitary, cervicomedullary junction and visualized cervical spine. Visualized bone marrow signal is within normal limits.  Mild paranasal sinus mucosal thickening. Visualized orbit soft tissues are within normal limits.  Mastoids are clear.   Negative scalp soft tissues.  IMPRESSION: 1.  Acute lacunar infarct lateral right thalamus near the posterior limb right internal capsule.  No mass effect or hemorrhage. 2. Otherwise negative noncontrast brain MRI. 3.  MRA findings are below.  MRA HEAD WITHOUT CONTRAST  Technique: Angiographic images of the Circle of Willis were obtained using MRA technique without  intravenous contrast.  Findings: Antegrade flow in the posterior circulation codominant distal vertebral arteries.  No distal vertebral artery stenosis. Normal bilateral PICA vessels.  Patent vertebrobasilar junction. Normal basilar artery.  SCA and PCA origins are normal.  Posterior communicating arteries are diminutive or absent.  Right PCA P1 and P2 segments are within normal limits.  Bilateral PCA branches are within normal limits.  Antegrade flow in both ICA siphons.  Mild irregularity of the distal cervical right ICA.  No ICA siphon stenosis.  Normal ophthalmic artery origins.  Normal carotid termini, MCA and ACA origins.  Anterior communicating artery diminutive or absent.  Visualized ACA branches are within normal limits.  Visualized bilateral MCA branches are within normal limits.  IMPRESSION: 1. Negative intracranial MRA. 2.  Irregularity of the visible distal cervical right ICA which may represent atherosclerosis.   Original Report Authenticated By: Erskine Speed, M.D.     Scheduled Meds:   . aspirin EC  325 mg Oral Daily  . famotidine  20 mg Oral QHS  .  heparin  5,000 Units Subcutaneous Q8H  . multivitamin with minerals  1 tablet Oral Daily  . pantoprazole  40 mg Oral Daily  . potassium chloride  20 mEq Oral BID  . simvastatin  20 mg Oral q1800   Continuous Infusions:   Principal Problem:  *Stroke     Brendia Sacks, MD  [facility] Team 4  Pager [phone] If 7PM-7AM, please contact night-coverage at [email or web address], password [facility] 06/29/2012, 11:42 AM  LOS: 1 day   Time spent: 20 minutes

## 2012-06-29 NOTE — ED Notes (Signed)
Pt resting on bed with husband at bedside.  No wants or needs at this time

## 2012-06-29 NOTE — Progress Notes (Addendum)
Stroke Team Progress Note  HISTORY  Tracie Roach is a 57 y.o. female with a hsitory of numbness going on for 3-4 days PTA. She stated that it initially was waxing and waning, but over the previous two days, it had been fixed. She denied weakness, vision change, or other symptoms. She got an MRI showing a small thalamic infarct.  LKW: several days ago  tpa given: no, out of window.    SUBJECTIVE Her husband is at the bedside.  Overall she feels her condition is about the same. Getting 2 D echo now.  OBJECTIVE Most recent Vital Signs: Filed Vitals:   06/29/12 0132 06/29/12 0133 06/29/12 0133 06/29/12 0601  BP:  130/70  129/57  Pulse: 75 68  72  Temp:   97.5 F (36.4 C) 97.4 F (36.3 C)  TempSrc:    Oral  Resp:    18  SpO2: 100% 100%  98%   CBG (last 3)   Basename 06/28/12 1853  GLUCAP 112*    IV Fluid Intake:     MEDICATIONS    . aspirin EC  325 mg Oral Daily  . famotidine  20 mg Oral QHS  . heparin  5,000 Units Subcutaneous Q8H  . multivitamin with minerals  1 tablet Oral Daily  . pantoprazole  40 mg Oral Daily  . potassium chloride  20 mEq Oral BID   PRN:    Diet:  Cardiac thin liquids Activity:  As tolerated DVT Prophylaxis:  [place] Heparin  CLINICALLY SIGNIFICANT STUDIES Basic Metabolic Panel:  Lab 06/29/12 1610 06/28/12 2054  NA -- 142  K -- 3.4*  CL -- 103  CO2 -- 27  GLUCOSE -- 85  BUN -- 9  CREATININE 0.69 0.71  CALCIUM -- 9.6  MG -- --  PHOS -- --   Liver Function Tests: No results found for this basename: AST:2,ALT:2,ALKPHOS:2,BILITOT:2,PROT:2,ALBUMIN:2 in the last 168 hours CBC:  Lab 06/29/12 0242 06/28/12 2054  WBC 9.6 8.5  NEUTROABS -- 4.8  HGB 13.2 12.6  HCT 39.4 39.4  MCV 90.0 90.0  PLT 354 346   Coagulation:  Lab 06/28/12 2054  LABPROT 12.9  INR 0.98   Cardiac Enzymes: No results found for this basename: CKTOTAL:3,CKMB:3,CKMBINDEX:3,TROPONINI:3 in the last 168 hours Urinalysis: No results found for this basename:  COLORURINE:2,APPERANCEUR:2,LABSPEC:2,PHURINE:2,GLUCOSEU:2,HGBUR:2,BILIRUBINUR:2,KETONESUR:2,PROTEINUR:2,UROBILINOGEN:2,NITRITE:2,LEUKOCYTESUR:2 in the last 168 hours Lipid Panel    Component Value Date/Time   CHOL 201* 06/29/2012 0535   TRIG 112 06/29/2012 0535   HDL 43 06/29/2012 0535   CHOLHDL 4.7 06/29/2012 0535   VLDL 22 06/29/2012 0535   LDLCALC 136* 06/29/2012 0535   HgbA1C  No results found for this basename: HGBA1C    Urine Drug Screen:   No results found for this basename: labopia, cocainscrnur, labbenz, amphetmu, thcu, labbarb    Alcohol Level: No results found for this basename: ETH:2 in the last 168 hours  Mr [facility] Wo Contrast  06/28/12  IMPRESSION: 1.  Acute lacunar infarct lateral right thalamus near the posterior limb right internal capsule.  No mass effect or hemorrhage. 2. Otherwise negative noncontrast brain MRI.  MRA HEAD WITHOUT CONTRAST   IMPRESSION: 1. Negative intracranial MRA. 2.  Irregularity of the visible distal cervical right ICA which may represent atherosclerosis.     2D Echocardiogram  - Pending   Carotid Doppler  - Pending   EKG SR rate 81 BPM  Therapy Recommendations pending  Physical Exam   Mental Status:  Patient is awake, alert, oriented to person, place, month,  year, and situation.  I Cranial Nerves:  II: Visual Fields are full. Pupils are equal, round, and reactive to light. Discs are difficult to visualize.  III,IV, VI: EOMI without ptosis or diploplia.  V: Facial sensation is symmetric to temperature  VII: Facial movement is symmetric.  VIII: hearing is intact to voice  X: Uvula elevates symmetrically  XI: Shoulder shrug is symmetric.  XII: tongue is midline without atrophy or fasciculations.  Motor:  Tone is normal. Bulk is normal. 5/5 strength was present in all four extremities.  Sensory:  Sensation is diminished in the left face arma dn leg compared to right.  Deep Tendon Reflexes:  2+ and symmetric in the biceps and  patellae.  Cerebellar:  FNF intact bilaterally  Gait:  Did not test due to multiple monitors in ER setting.    Exam: General: in bed, NAD  CV: RRR  Mental Status:  Patient is awake, alert, oriented to person, place, month, year, and situation.  Immediate and remote memory are intact.  Patient is able to give a clear and coherent history.  Able to spell world backwards without difficulty   Cranial Nerves:  II: Visual Fields are full. Pupils are equal, round, and reactive to light. Discs are difficult to visualize.  III,IV, VI: EOMI without ptosis or diploplia.  V: Facial sensation is symmetric to temperature  VII: Facial movement is symmetric.  VIII: hearing is intact to voice  X: Uvula elevates symmetrically  XI: Shoulder shrug is symmetric.  XII: tongue is midline without atrophy or fasciculations.  Motor:  Tone is normal. Bulk is normal. 5/5 strength was present in all four extremities.  Sensory:  Sensation is diminished in the left face arma dn leg compared to right.  Deep Tendon Reflexes:  2+ and symmetric in the biceps and patellae.  Cerebellar:  FNF intact bilaterally  ASSESSMENT [name] is a 57 y.o. female presenting with left sided numbness. No TPA - Late presentation. Imaging confirms an acute lacunar infarct lateral right thalamus near the posterior limb right internal capsule. 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 Work up underway. On Excedrin prn prior to admission. Now on aspirin 325 mg orally every day for secondary stroke prevention. Patient with resultant diminished sensation on the left side.  Hyperlipidemia  Previous tobacco history.  Hospital day # 1  TREATMENT/PLAN  Continue aspirin 325 mg orally every day for secondary stroke prevention.  Hyperlipidemia - start statin.  Await therapists evaluation.  Risk factor modification.  Await HgBA1C  Hassel Neth [facility] Pager [phone] 06/29/2012,  10:02 AM  I have personally obtained a history, examined the patient, evaluated imaging results, and formulated the assessment and plan of care. I agree with the above.

## 2012-06-30 DIAGNOSIS — Z8669 Personal history of other diseases of the nervous system and sense organs: Secondary | ICD-10-CM

## 2012-06-30 MED ORDER — AMITRIPTYLINE HCL 10 MG PO TABS
10.0000 mg | ORAL_TABLET | Freq: Every day | ORAL | Status: DC
  Filled 2012-06-30: qty 1

## 2012-06-30 MED ORDER — B COMPLEX-C PO TABS: 1.0000 | ORAL_TABLET | Freq: Every day | ORAL | Status: AC

## 2012-06-30 MED ORDER — B COMPLEX-C PO TABS
1.0000 | ORAL_TABLET | Freq: Every day | ORAL | Status: DC
  Administered 2012-06-30: 1 via ORAL
  Filled 2012-06-30: qty 1

## 2012-06-30 MED ORDER — MAGNESIUM GLUCONATE 500 MG PO TABS
500.0000 mg | ORAL_TABLET | Freq: Every day | ORAL | Status: DC
  Administered 2012-06-30: 500 mg via ORAL
  Filled 2012-06-30: qty 1

## 2012-06-30 MED ORDER — MAGNESIUM GLUCONATE 500 MG PO TABS: 500.0000 mg | ORAL_TABLET | Freq: Every day | ORAL | Status: AC

## 2012-06-30 MED ORDER — SIMVASTATIN 20 MG PO TABS: 20.0000 mg | ORAL_TABLET | Freq: Every day | ORAL | Status: DC

## 2012-06-30 MED ORDER — ASPIRIN 325 MG PO TBEC: 325.0000 mg | DELAYED_RELEASE_TABLET | Freq: Every day | ORAL | Status: AC

## 2012-06-30 MED ORDER — AMITRIPTYLINE HCL 10 MG PO TABS: 10.0000 mg | ORAL_TABLET | Freq: Every day | ORAL | Status: DC

## 2012-06-30 NOTE — Progress Notes (Signed)
Stroke Team Progress Note  HISTORY  Tracie Roach is a 57 y.o. female with a hsitory of numbness going on for 3-4 days PTA. She stated that it initially was waxing and waning, but over the previous two days, it had been fixed. She denied weakness, vision change, or other symptoms. She got an MRI showing a small thalamic infarct.  LKW: several days ago  tpa given: no, out of window.    SUBJECTIVE Her husband is at the bedside.  Overall she feels her condition is about the same. She would like to go home soon. Wants to know how soon she can resume sexual relations with husband. Will discuss. Therapists have not been in yet. Will check.  OBJECTIVE Most recent Vital Signs: Filed Vitals:   06/29/12 1830 06/29/12 2100 06/29/12 2201 06/30/12 0631  BP: 128/62  114/57 140/71  Pulse: 81  66 76  Temp: 97.9 F (36.6 C)  97.8 F (36.6 C) 97.9 F (36.6 C)  TempSrc: Oral  Oral Oral  Resp: 18  16 18   Height:  5\' 5"  (1.651 m)    Weight:  69.854 kg (154 lb)    SpO2: 100%  96%    CBG (last 3)   Basename 06/28/12 1853  GLUCAP 112*    IV Fluid Intake:     MEDICATIONS     . aspirin EC  325 mg Oral Daily  . docusate sodium  100 mg Oral BID  . famotidine  20 mg Oral QHS  . heparin  5,000 Units Subcutaneous Q8H  . multivitamin with minerals  1 tablet Oral Daily  . pantoprazole  40 mg Oral Daily  . polyethylene glycol  17 g Oral Daily  . potassium chloride  20 mEq Oral BID  . simvastatin  20 mg Oral q1800   PRN:  acetaminophen  Diet:  Cardiac thin liquids Activity:  As tolerated DVT Prophylaxis:  [place] Heparin  CLINICALLY SIGNIFICANT STUDIES Basic Metabolic Panel:   Lab 06/29/12 0242 06/28/12 2054  NA -- 142  K -- 3.4*  CL -- 103  CO2 -- 27  GLUCOSE -- 85  BUN -- 9  CREATININE 0.69 0.71  CALCIUM -- 9.6  MG -- --  PHOS -- --   Liver Function Tests: No results found for this basename: AST:2,ALT:2,ALKPHOS:2,BILITOT:2,PROT:2,ALBUMIN:2 in the last 168 hours CBC:   Lab 06/29/12  0242 06/28/12 2054  WBC 9.6 8.5  NEUTROABS -- 4.8  HGB 13.2 12.6  HCT 39.4 39.4  MCV 90.0 90.0  PLT 354 346   Coagulation:   Lab 06/28/12 2054  LABPROT 12.9  INR 0.98   Cardiac Enzymes: No results found for this basename: CKTOTAL:3,CKMB:3,CKMBINDEX:3,TROPONINI:3 in the last 168 hours Urinalysis: No results found for this basename: COLORURINE:2,APPERANCEUR:2,LABSPEC:2,PHURINE:2,GLUCOSEU:2,HGBUR:2,BILIRUBINUR:2,KETONESUR:2,PROTEINUR:2,UROBILINOGEN:2,NITRITE:2,LEUKOCYTESUR:2 in the last 168 hours Lipid Panel    Component Value Date/Time   CHOL 201* 06/29/2012 0535   TRIG 112 06/29/2012 0535   HDL 43 06/29/2012 0535   CHOLHDL 4.7 06/29/2012 0535   VLDL 22 06/29/2012 0535   LDLCALC 136* 06/29/2012 0535   HgbA1C  Lab Results  Component Value Date   HGBA1C 5.6 06/29/2012    Urine Drug Screen:   No results found for this basename: labopia,  cocainscrnur,  labbenz,  amphetmu,  thcu,  labbarb    Alcohol Level: No results found for this basename: ETH:2 in the last 168 hours  Mr [facility] Wo Contrast  06/28/12  IMPRESSION: 1.  Acute lacunar infarct lateral right thalamus near the posterior limb right  internal capsule.  No mass effect or hemorrhage. 2. Otherwise negative noncontrast brain MRI.  MRA HEAD WITHOUT CONTRAST   IMPRESSION: 1. Negative intracranial MRA. 2.  Irregularity of the visible distal cervical right ICA which may represent atherosclerosis.     2D Echocardiogram  - EF 55-60%. No obvious source of emboli noted.    Carotid Doppler  - Preliminary report: There is no ICA stenosis. Vertebral artery flow is antegrade.  EKG SR rate 81 BPM  Therapy Recommendations pending  Physical Exam   General - Pleasant 57 yo female in NAD Heart - Regular rate and rhythm - no murmer Lungs - Clear to auscultation Extremities - Distal pulses intact - no edema Skin - Warm and dry  Mental Status:  Patient is awake, alert, oriented to person, place, month, year, and situation.    Cranial Nerves:  II: Visual Fields are full. Pupils are equal, round, and reactive to light. Discs are difficult to visualize.  III,IV, VI: EOMI without ptosis or diploplia.  V: Facial sensation is symmetric to temperature  VII: Facial movement is symmetric.  VIII: hearing is intact to voice  X: Uvula elevates symmetrically  XI: Shoulder shrug is symmetric.  XII: tongue is midline without atrophy or fasciculations.  Motor:  Tone is normal. Bulk is normal. 5/5 strength was present in all four extremities.  Sensory:  Sensation is diminished in the left face arm and leg compared to right.  Deep Tendon Reflexes:  2+ and symmetric in the biceps and patellae.  Cerebellar:  FNF intact bilaterally but somewhat slow on left.   ASSESSMENT Tracie Roach is a 57 y.o. female presenting with left sided numbness. No TPA - Late presentation. Imaging confirms an acute lacunar infarct lateral right thalamus near the posterior limb right internal capsule. 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 Work up underway. On Excedrin prn prior to admission. Now on aspirin 325 mg orally every day for secondary stroke prevention. Patient with resultant diminished sensation on the left side.  Hyperlipidemia - now on statin  Previous tobacco history   Hospital day # 2  TREATMENT/PLAN  Continue aspirin 325 mg orally every day for secondary stroke prevention.  Hyperlipidemia - start statin.  Await therapists evaluation.  Risk factor modification.  HgBA1C - 5.6  BP well controlled without meds.  Discuss pts questions regarding resuming sexual relations with husband.- OK to resume as tolerated per Dr Loretha Brasil.  Migraine headaches - per Dr Loretha Brasil - start low dose Elavil at HS. Willl need outpt F/U with headache specialist if problem persists.  Probable discharge later today if cleared by therapists.   Delton See PA-C [facility] Pager [phone] 06/30/2012,  8:45 AM  I have personally obtained a history, examined the patient, evaluated imaging results, and formulated the assessment and plan of care. I agree with the above.  Pt seen ambulating on her own today. Decreased sensation L hemibody still present.  Pt has hx of Migraines had discussion with her and her husband regarding migraine management. Migraines 3x/week relieved with excedrin/ friocet. Recommended her starting abortive therapy with amytryptaline 10mg  QHS. Daily 500mg  MG and B complex over the counter.  F/u with neurology and HA specialist.

## 2012-06-30 NOTE — Progress Notes (Signed)
Occupational Therapy Evaluation Patient Details Name: Tracie Roach MRN: [id number] DOB: 1955-09-26 Today's Date: 06/30/2012 Time: 7846-9629 OT Time Calculation (min): 42 min  OT Assessment / Plan / Recommendation Clinical Impression  57 yo s/p r thalamic CVA with resulting sensory deficits on L side. Completed all education regarding compensatory/safety regarding sensory loss. Pt also given HEP for coordination and increasing attention to L side due to sensory deficit. Pt/family given handouts on stroke s/s and HEP. Pt/family verbalized understanding.  Pt will have adequate support for home D/C. No further OT needed.     OT Assessment  Patient does not need any further OT services    Follow Up Recommendations  No OT follow up    Barriers to Discharge  none    Equipment Recommendations  None recommended by OT    Recommendations for Other Services  none  Frequency    eval only   Precautions / Restrictions Precautions Precautions: None Restrictions Weight Bearing Restrictions: No   Pertinent Vitals/Pain No pain. Vitals WNL    ADL  Eating/Feeding: Independent Where Assessed - Eating/Feeding: Edge of bed Grooming: Modified independent Where Assessed - Grooming: Unsupported standing Upper Body Bathing: Modified independent Where Assessed - Upper Body Bathing: Unsupported sitting Lower Body Bathing: Modified independent Where Assessed - Lower Body Bathing: Unsupported sit to stand Upper Body Dressing: Independent Where Assessed - Upper Body Dressing: Unsupported sitting Lower Body Dressing: Modified independent Where Assessed - Lower Body Dressing: Unsupported sit to stand Toilet Transfer: Modified independent Toilet Transfer Method: Sit to Barista: Comfort height toilet Toileting - Clothing Manipulation and Hygiene: Independent Where Assessed - Engineer, mining and Hygiene: Standing Tub/Shower Transfer:  Supervision/safety Transfers/Ambulation Related to ADLs: mod i ADL Comments: Rec to family to S when in shower and in kitchen due to sensory deficits    OT Diagnosis:    OT Problem List:   OT Treatment Interventions:     OT Goals Acute Rehab OT Goals OT Goal Formulation:  (eval only)  Visit Information  Last OT Received On: 06/30/12 Assistance Needed: +1    Subjective Data      Prior Functioning     Home Living Lives With: Spouse Available Help at Discharge: Family;Available PRN/intermittently Type of Home: House Home Access: Stairs to enter [employer] of Steps: 2 Entrance Stairs-Rails: None Home Layout: Two level;Able to live on main level with bedroom/bathroom Alternate Level Stairs-Number of Steps: 12 Alternate Level Stairs-Rails: Right Bathroom Shower/Tub: Walk-in shower;Door [employer] Toilet: Handicapped height Bathroom Accessibility: Yes How Accessible: Accessible via walker Home Adaptive Equipment: Built-in shower seat;Crutches Prior Function Level of Independence: Independent Able to Take Stairs?: Yes Driving: Yes Vocation: Works at home Communication Communication: No difficulties Dominant Hand: Right         Vision/Perception Vision - Assessment Eye Alignment: Within Chemical engineer Perception: Within Functional Limits Praxis Praxis: Intact   Cognition  Overall Cognitive Status: Appears within functional limits for tasks assessed/performed Arousal/Alertness: Awake/alert Orientation Level: Appears intact for tasks assessed Behavior During Session: [facility] for tasks performed    Extremity/Trunk Assessment Right Upper Extremity Assessment RUE ROM/Strength/Tone: WFL for tasks assessed RUE Sensation: WFL - Light Touch;WFL - Proprioception RUE Coordination: WFL - gross/fine motor Left Upper Extremity Assessment LUE ROM/Strength/Tone: Deficits LUE ROM/Strength/Tone Deficits: WFL LUE Sensation: WFL -  Proprioception;Deficits LUE Sensation Deficits: light touch. ? temp LUE Coordination: Deficits LUE Coordination Deficits: due to sensory deficits Right Lower Extremity Assessment RLE ROM/Strength/Tone: [facility] for tasks assessed RLE Sensation: [facility] -  Light Touch RLE Coordination: WFL - gross/fine motor Left Lower Extremity Assessment LLE ROM/Strength/Tone: Within functional levels LLE Sensation: WFL - Proprioception;Deficits LLE Sensation Deficits: decreased light touch LLE Coordination: Deficits LLE Coordination Deficits: due to sensory deficits Trunk Assessment Trunk Assessment: Other exceptions (sensory deificits)     Mobility Bed Mobility Bed Mobility: Supine to Sit;Sit to Supine Supine to Sit: 7: Independent Sit to Supine: 7: Independent Transfers Transfers: Sit to Stand;Stand to Sit Sit to Stand: 7: Independent Stand to Sit: 7: Independent Details for Transfer Assistance: mod I for quick turns, dynamic due to sensory deficits     Shoulder Instructions     Exercise  HEP - coordination. Stereognosis activities. Sensory awareness activities   Balance Balance Balance Assessed:  (WFl for ADL)   End of Session OT - End of Session Activity Tolerance: Patient tolerated treatment well Patient left: in bed;with call bell/phone within reach;with family/visitor present Nurse Communication: Mobility status;Other (comment) (ready for D/C)  GO     Tracie Roach,Tracie Roach 06/30/2012, 1:06 PM [facility], OTR/L  [phone] 06/30/2012

## 2012-06-30 NOTE — Evaluation (Signed)
Physical Therapy Evaluation Patient Details Name: Tracie Roach MRN: [id number] DOB: 1955-07-15 Today's Date: 06/30/2012 Time: 6213-0865 PT Time Calculation (min): 19 min  PT Assessment / Plan / Recommendation Clinical Impression  Pt admitted with left sided weakness, found to have thalamic infarct. Pt currently with symptoms of L sided numbness unaffecting functional mobility. No further PT needs, will not follow    PT Assessment  Patent does not need any further PT services    Follow Up Recommendations  No PT follow up    Does the patient have the potential to tolerate intense rehabilitation      Barriers to Discharge        Equipment Recommendations  None recommended by PT    Recommendations for Other Services     Frequency      Precautions / Restrictions Precautions Precautions: None Restrictions Weight Bearing Restrictions: No   Pertinent Vitals/Pain No complaints of pain      Mobility  Bed Mobility Bed Mobility: Supine to Sit;Sit to Supine Supine to Sit: 7: Independent Sit to Supine: 7: Independent Transfers Transfers: Sit to Stand;Stand to Sit Sit to Stand: 7: Independent Stand to Sit: 7: Independent Ambulation/Gait Ambulation/Gait Assistance: 7: Independent Ambulation Distance (Feet): 300 Feet Assistive device: None Gait Pattern: Within Functional Limits Gait velocity: normal gait speed Stairs: Yes Stairs Assistance: 7: Independent Stair Management Technique: One rail Left;Step to pattern Number of Stairs: 12  Modified Rankin (Stroke Patients Only) Pre-Morbid Rankin Score: No symptoms Modified Rankin: No significant disability    Shoulder Instructions     Exercises     PT Diagnosis:    PT Problem List:   PT Treatment Interventions:     PT Goals    Visit Information  Last PT Received On: 06/30/12 Assistance Needed: +1    Subjective Data  Patient Stated Goal: to go home   Prior Functioning  Home Living Lives With:  Spouse Available Help at Discharge: Family;Available PRN/intermittently Type of Home: House Home Access: Stairs to enter [employer] of Steps: 2 Entrance Stairs-Rails: None Home Layout: Two level;Able to live on main level with bedroom/bathroom Alternate Level Stairs-Number of Steps: 12 Alternate Level Stairs-Rails: Right Bathroom Shower/Tub: Walk-in shower;Door Bathroom Toilet: Handicapped height Bathroom Accessibility: Yes How Accessible: Accessible via walker Home Adaptive Equipment: Built-in shower seat;Crutches Prior Function Level of Independence: Independent Able to Take Stairs?: Yes Driving: Yes Vocation: Works at home Communication Communication: No difficulties Dominant Hand: Right    Cognition  Overall Cognitive Status: Appears within functional limits for tasks assessed/performed Arousal/Alertness: Awake/alert Orientation Level: Appears intact for tasks assessed Behavior During Session: [facility] for tasks performed    Extremity/Trunk Assessment Right Lower Extremity Assessment RLE ROM/Strength/Tone: Within functional levels RLE Sensation: WFL - Light Touch Left Lower Extremity Assessment LLE ROM/Strength/Tone: Within functional levels LLE Sensation: WFL - Light Touch   Balance    End of Session PT - End of Session Activity Tolerance: Patient tolerated treatment well Patient left: in bed;with call bell/phone within reach;with family/visitor present Nurse Communication: Mobility status  GP     Milana Kidney 06/30/2012, 10:56 AM  06/30/2012 Milana Kidney DPT PAGER: [phone] OFFICE: [phone]

## 2012-06-30 NOTE — Discharge Summary (Signed)
Physician Discharge Summary  Tracie Roach ZOX:[id number] DOB: 09-01-1955 DOA: 06/28/2012  PCP: Tracie Mackintosh, MD  Admit date: 06/28/2012 Discharge date: 06/30/2012  Recommendations for Outpatient Follow-up:  1. Risk factor reduction, secondary prevention of stroke  2. Migraine headaches as needed  3. Note echocardiogram results as below 4. No need for physical, occupational and speech therapy   Follow-up Information    Follow up with Tracie P, MD. In 1 month. (Call after discharge for appt in 1 to 2 months)    Contact information:   [address], [address] [facility] [place] [address] [phone]       Follow up with Tracie Mackintosh, MD. In 2 weeks.   Contact information:   [address] [place] [address] [phone]         Discharge Diagnoses:  1. Small thalamic infarct 2. Hyperlipidemia 3. Migraine headaches  Discharge Condition: Improved Disposition: Home  Diet recommendation: Heart healthy  Filed Weights   06/29/12 2100  Weight: 69.854 kg (154 lb)    History of present illness:  57 y.o. female with a history of numbness going on for 3-4 days. She states that it initially was waxing and waning, but over the past two days prior to admission, it has been fixed. She got an MRI showing small  tpa given: no, out of window.   Hospital Course:  [name] was admitted for further evaluation of stroke. She was seen in consultation with neurology. Full dose aspirin was recommended, statin was added. Neurologic condition remained unchanged and remainder of stroke workup was unremarkable. Elavil was recommended for migraine headache prophylaxis. Echocardiogram revealed borderline systolic function, asymptomatic. No further inpatient evaluation suggested at this point.  1. Small thalamic infarct: Likely small vessel etiology. Aspirin 325 mg daily per neurology. Blood pressure well controlled without medication. Statin. EKG sinus  rhythm.  2. Hyperlipidemia: Statin.  3. Migraine headaches: Low-dose Elavil at night. Consider outpatient followup.  Consultants:  Neurology   Physical therapy: No needs.   Occupational therapy: No needs   Speech therapy: Deferred, no formal need for evaluation.  Procedures:  Bilateral carotid Dopplers: No ICA stenosis. Vertebral artery flow antegrade.   2-D echocardiogram: Normal LV size with low normal to mildly decreased systolic function, EF 50-55%. Normal RV size and systolic function. No significant valvular abnormalities  Discharge Instructions  Discharge Orders    Future Orders Please Complete By Expires   Diet - low sodium heart healthy      Discharge instructions      Comments:   You were started on Zocor and aspirin to help reduce the risk of future stroke. Elavil has been recommended for migraine headache prevention. Call your physician or seek immediate medical attention for weakness, worsening of numbness or change in condition.   Activity as tolerated - No restrictions          Medication List     As of 06/30/2012 12:59 PM    STOP taking these medications         aspirin-acetaminophen-caffeine 250-250-65 MG per tablet   Commonly known as: EXCEDRIN MIGRAINE      TAKE these medications         amitriptyline 10 MG tablet   Commonly known as: ELAVIL   Take 1 tablet (10 mg total) by mouth at bedtime.      aspirin 325 MG EC tablet   Take 1 tablet (325 mg total) by mouth daily.      B-complex with  vitamin C tablet   Take 1 tablet by mouth daily.      butalbital-acetaminophen-caffeine 50-325-40 MG per tablet   Commonly known as: FIORICET, ESGIC   Take 1 tablet by mouth 2 (two) times daily as needed. For migraines      magnesium gluconate 500 MG tablet   Commonly known as: MAGONATE   Take 1 tablet (500 mg total) by mouth daily.      multivitamin with minerals tablet   Take 1 tablet by mouth daily.      simvastatin 20 MG tablet   Commonly known  as: ZOCOR   Take 1 tablet (20 mg total) by mouth daily at 6 PM.        The results of significant diagnostics from this hospitalization (including imaging, microbiology, ancillary and laboratory) are listed below for reference.    Significant Diagnostic Studies: Mr Shirlee Latch ZO Contrast  06/28/2012  *RADIOLOGY REPORT*  Clinical Data:  57 year old female with numbness, left side hemipareses.  Left face numbness progressing to left hand numbness times 3 days.  Comparison: None.  MRI HEAD WITHOUT CONTRAST  Technique: Multiplanar, multiecho pulse sequences of the brain and surrounding structures were obtained according to standard protocol without intravenous contrast.  Findings: Linear 6 mm focus of restricted diffusion at the lateral aspect of the right thalamus near the posterior limb right internal capsule.  Associated T2 and FLAIR hyperintensity.  No mass effect or hemorrhage.  Major intracranial vascular flow voids are preserved, MRA findings are below.  [name] Normal cerebral volume.  No ventriculomegaly.  Negative pituitary, cervicomedullary junction and visualized cervical spine. Visualized bone marrow signal is within normal limits.  Mild paranasal sinus mucosal thickening. Visualized orbit soft tissues are within normal limits.  Mastoids are clear.  Negative scalp soft tissues.  IMPRESSION: 1.  Acute lacunar infarct lateral right thalamus near the posterior limb right internal capsule.  No mass effect or hemorrhage. 2. Otherwise negative noncontrast brain MRI. 3.  MRA findings are below.  MRA HEAD WITHOUT CONTRAST  Technique: Angiographic images of the Circle of Willis were obtained using MRA technique without  intravenous contrast.  Findings: Antegrade flow in the posterior circulation codominant distal vertebral arteries.  No distal vertebral artery stenosis. Normal bilateral PICA vessels.  Patent vertebrobasilar junction. Normal basilar  artery.  SCA and PCA origins are normal.  Posterior communicating arteries are diminutive or absent.  Right PCA P1 and P2 segments are within normal limits.  Bilateral PCA branches are within normal limits.  Antegrade flow in both ICA siphons.  Mild irregularity of the distal cervical right ICA.  No ICA siphon stenosis.  Normal ophthalmic artery origins.  Normal carotid termini, MCA and ACA origins.  Anterior communicating artery diminutive or absent.  Visualized ACA branches are within normal limits.  Visualized bilateral MCA branches are within normal limits.  IMPRESSION: 1. Negative intracranial MRA. 2.  Irregularity of the visible distal cervical right ICA which may represent atherosclerosis.   Original Report Authenticated By: Erskine Speed, M.D.    Labs: Basic Metabolic Panel:  Lab 06/29/12 1096 06/28/12 2054  NA -- 142  K -- 3.4*  CL -- 103  CO2 -- 27  GLUCOSE -- 85  BUN -- 9  CREATININE 0.69 0.71  CALCIUM -- 9.6  MG -- --  PHOS -- --   CBC:  Lab 06/29/12 0242 06/28/12 2054  WBC 9.6 8.5  NEUTROABS -- 4.8  HGB 13.2 12.6  HCT 39.4 39.4  MCV 90.0 90.0  PLT 354 346   CBG:  Lab 06/28/12 1853  GLUCAP 112*    Principal Problem:  *Stroke Active Problems:  Hyperlipidemia   Time coordinating discharge: 25 minutes  Signed:  Brendia Sacks, MD [facility] 06/30/2012, 12:59 PM

## 2012-06-30 NOTE — Progress Notes (Signed)
[facility] PROGRESS NOTE  Tracie Roach ZOX:[id number] DOB: 05/14/1956 DOA: 06/28/2012 PCP: Margaree Mackintosh, MD  Assessment/Plan: 1. Small thalamic infarct: Likely small vessel etiology. Aspirin 325 mg daily per neurology. Blood pressure well controlled without medication. Statin. EKG sinus rhythm. 2. Hyperlipidemia: Statin. 3. Migraine headaches: Low-dose Elavil at night. Consider outpatient followup.  Code Status: Full code Family Communication: Discussed with husband and daughter at bedside Disposition Plan: Home   Brendia Sacks, MD  [facility] Team 4  Pager [phone] If 7PM-7AM, please contact night-coverage at [email or web address], password [facility] 06/30/2012, 12:37 PM  LOS: 2 days   Brief narrative: 57 y.o. female with a hsitory of numbness going on for 3-4 days. She states that it initially was waxing and waning, but over the past two days, it has been fixed. She got an MRI showing small thalamic infarct.  LKW: several days ago  tpa given: no, out of window.   Consultants:  Neurology  Physical therapy: No needs.  Occupational therapy: No needs  Speech therapy: Deferred, no formal need for evaluation.  Procedures:  Bilateral carotid Dopplers: No ICA stenosis. Vertebral artery flow antegrade.  2-D echocardiogram: Normal LV size with low normal to mildly decreased systolic function, EF 50-55%. Normal RV size and systolic function. No significant valvular abnormalities  HPI/Subjective: Overall doing well. Numbness persists. No new problems voiced.  Objective: Filed Vitals:   06/29/12 2100 06/29/12 2201 06/30/12 0631 06/30/12 1000  BP:  114/57 140/71 142/67  Pulse:  66 76 64  Temp:  97.8 F (36.6 C) 97.9 F (36.6 C) 97.8 F (36.6 C)  TempSrc:  Oral Oral Oral  Resp:  16 18 18   Height: 5\' 5"  (1.651 m)     Weight: 69.854 kg (154 lb)     SpO2:  96%  98%   No intake or output data in the 24 hours ending 06/30/12 1237 Filed Weights   06/29/12 2100    Weight: 69.854 kg (154 lb)    Exam:  General:  Appears calm and comfortable Cardiovascular: RRR, no m/r/g. No LE edema. Telemetry: SR, no arrhythmias  Respiratory: CTA bilaterally, no w/r/r. Normal respiratory effort. Psychiatric: grossly normal mood and affect, speech fluent and appropriate Neurologic: Appears unchanged.  Data Reviewed: Basic Metabolic Panel:  Lab 06/29/12 8295 06/28/12 2054  NA -- 142  K -- 3.4*  CL -- 103  CO2 -- 27  GLUCOSE -- 85  BUN -- 9  CREATININE 0.69 0.71  CALCIUM -- 9.6  MG -- --  PHOS -- --   CBC:  Lab 06/29/12 0242 06/28/12 2054  WBC 9.6 8.5  NEUTROABS -- 4.8  HGB 13.2 12.6  HCT 39.4 39.4  MCV 90.0 90.0  PLT 354 346   CBG:  Lab 06/28/12 1853  GLUCAP 112*     Studies: Mr Lucretia Kern Contrast  06/28/2012  *RADIOLOGY REPORT*  Clinical Data:  57 year old female with numbness, left side hemipareses.  Left face numbness progressing to left hand numbness times 3 days.  Comparison: None.  MRI HEAD WITHOUT CONTRAST  Technique: Multiplanar, multiecho pulse sequences of the brain and surrounding structures were obtained according to standard protocol without intravenous contrast.  Findings: Linear 6 mm focus of restricted diffusion at the lateral aspect of the right thalamus near the posterior limb right internal capsule.  Associated T2 and FLAIR hyperintensity.  No mass effect or hemorrhage.  Major intracranial vascular flow voids are preserved, MRA findings are below.  Wallace Cullens and white matter signal elsewhere is within  normal limits. Normal cerebral volume.  No ventriculomegaly.  Negative pituitary, cervicomedullary junction and visualized cervical spine. Visualized bone marrow signal is within normal limits.  Mild paranasal sinus mucosal thickening. Visualized orbit soft tissues are within normal limits.  Mastoids are clear.  Negative scalp soft tissues.  IMPRESSION: 1.  Acute lacunar infarct lateral right thalamus near the posterior limb right  internal capsule.  No mass effect or hemorrhage. 2. Otherwise negative noncontrast brain MRI. 3.  MRA findings are below.  MRA HEAD WITHOUT CONTRAST  Technique: Angiographic images of the Circle of Willis were obtained using MRA technique without  intravenous contrast.  Findings: Antegrade flow in the posterior circulation codominant distal vertebral arteries.  No distal vertebral artery stenosis. Normal bilateral PICA vessels.  Patent vertebrobasilar junction. Normal basilar artery.  SCA and PCA origins are normal.  Posterior communicating arteries are diminutive or absent.  Right PCA P1 and P2 segments are within normal limits.  Bilateral PCA branches are within normal limits.  Antegrade flow in both ICA siphons.  Mild irregularity of the distal cervical right ICA.  No ICA siphon stenosis.  Normal ophthalmic artery origins.  Normal carotid termini, MCA and ACA origins.  Anterior communicating artery diminutive or absent.  Visualized ACA branches are within normal limits.  Visualized bilateral MCA branches are within normal limits.  IMPRESSION: 1. Negative intracranial MRA. 2.  Irregularity of the visible distal cervical right ICA which may represent atherosclerosis.   Original Report Authenticated By: Erskine Speed, M.D.    Mr Brain Wo Contrast  06/28/2012  *RADIOLOGY REPORT*  Clinical Data:  57 year old female with numbness, left side hemipareses.  Left face numbness progressing to left hand numbness times 3 days.  Comparison: None.  MRI HEAD WITHOUT CONTRAST  Technique: Multiplanar, multiecho pulse sequences of the brain and surrounding structures were obtained according to standard protocol without intravenous contrast.  Findings: Linear 6 mm focus of restricted diffusion at the lateral aspect of the right thalamus near the posterior limb right internal capsule.  Associated T2 and FLAIR hyperintensity.  No mass effect or hemorrhage.  Major intracranial vascular flow voids are preserved, MRA findings are below.   [name] Normal cerebral volume.  No ventriculomegaly.  Negative pituitary, cervicomedullary junction and visualized cervical spine. Visualized bone marrow signal is within normal limits.  Mild paranasal sinus mucosal thickening. Visualized orbit soft tissues are within normal limits.  Mastoids are clear.  Negative scalp soft tissues.  IMPRESSION: 1.  Acute lacunar infarct lateral right thalamus near the posterior limb right internal capsule.  No mass effect or hemorrhage. 2. Otherwise negative noncontrast brain MRI. 3.  MRA findings are below.  MRA HEAD WITHOUT CONTRAST  Technique: Angiographic images of the Circle of Willis were obtained using MRA technique without  intravenous contrast.  Findings: Antegrade flow in the posterior circulation codominant distal vertebral arteries.  No distal vertebral artery stenosis. Normal bilateral PICA vessels.  Patent vertebrobasilar junction. Normal basilar artery.  SCA and PCA origins are normal.  Posterior communicating arteries are diminutive or absent.  Right PCA P1 and P2 segments are within normal limits.  Bilateral PCA branches are within normal limits.  Antegrade flow in both ICA siphons.  Mild irregularity of the distal cervical right ICA.  No ICA siphon stenosis.  Normal ophthalmic artery origins.  Normal carotid termini, MCA and ACA origins.  Anterior communicating artery diminutive or absent.  Visualized ACA branches are within normal limits.  Visualized  bilateral MCA branches are within normal limits.  IMPRESSION: 1. Negative intracranial MRA. 2.  Irregularity of the visible distal cervical right ICA which may represent atherosclerosis.   Original Report Authenticated By: Erskine Speed, M.D.     Scheduled Meds:    . amitriptyline  10 mg Oral QHS  . aspirin EC  325 mg Oral Daily  . B-complex with vitamin C  1 tablet Oral Daily  . docusate sodium  100 mg Oral BID  . famotidine  20 mg Oral QHS  .  heparin  5,000 Units Subcutaneous Q8H  . magnesium gluconate  500 mg Oral Daily  . multivitamin with minerals  1 tablet Oral Daily  . pantoprazole  40 mg Oral Daily  . polyethylene glycol  17 g Oral Daily  . potassium chloride  20 mEq Oral BID  . simvastatin  20 mg Oral q1800   Continuous Infusions:   Principal Problem:  *Stroke Active Problems:  Hyperlipidemia     Brendia Sacks, MD  [facility] Team 4  Pager [phone] If 7PM-7AM, please contact night-coverage at [email or web address], password [facility] 06/30/2012, 12:37 PM  LOS: 2 days

## 2012-07-01 NOTE — Care Management Note (Signed)
    Page 1 of 1   07/01/2012     2:51:31 PM   CARE MANAGEMENT NOTE 07/01/2012  Patient:  Tracie Roach, Tracie Roach   Account Number:  [id number]  Date Initiated:  07/01/2012  Documentation initiated by:  Jacquelynn Cree  Subjective/Objective Assessment:   Admitted with CVA     Action/Plan:   PT/OT evals-no follow up needs   Anticipated DC Date:     Anticipated DC Plan:        DC Planning Services  CM consult      Choice offered to / List presented to:             Status of service:  Completed, signed off Medicare Important Message given?   (If response is "NO", the following Medicare IM given date fields will be blank) Date Medicare IM given:   Date Additional Medicare IM given:    Discharge Disposition:  HOME/SELF CARE  Per UR Regulation:  Reviewed for med. necessity/level of care/duration of stay  If discussed at Long Length of Stay Meetings, dates discussed:    Comments:

## 2012-07-16 ENCOUNTER — Other Ambulatory Visit: Payer: PRIVATE HEALTH INSURANCE | Admitting: Internal Medicine

## 2012-07-16 DIAGNOSIS — E785 Hyperlipidemia, unspecified: Secondary | ICD-10-CM

## 2012-07-16 DIAGNOSIS — Z79899 Other long term (current) drug therapy: Secondary | ICD-10-CM

## 2012-07-16 LAB — LIPID PANEL
HDL: 42 mg/dL (ref >39)
LDL Cholesterol: 95 mg/dL (ref 0–99)
Total CHOL/HDL Ratio: 3.7 Ratio

## 2012-07-16 LAB — HEPATIC FUNCTION PANEL
Albumin: 4.4 g/dL (ref 3.5–5.2)
Alkaline Phosphatase: 118 U/L — ABNORMAL HIGH (ref 39–117)
Bilirubin, Direct: 0.1 mg/dL (ref 0.0–0.3)
Total Bilirubin: 0.4 mg/dL (ref 0.3–1.2)

## 2012-07-18 ENCOUNTER — Ambulatory Visit (INDEPENDENT_AMBULATORY_CARE_PROVIDER_SITE_OTHER): Payer: PRIVATE HEALTH INSURANCE | Admitting: Internal Medicine

## 2012-07-18 ENCOUNTER — Encounter: Payer: Self-pay | Admitting: Internal Medicine

## 2012-07-18 VITALS — BP 138/90 | HR 76 | Temp 97.7°F | Wt 152.0 lb

## 2012-07-18 DIAGNOSIS — I6381 Other cerebral infarction due to occlusion or stenosis of small artery: Secondary | ICD-10-CM

## 2012-07-18 DIAGNOSIS — F411 Generalized anxiety disorder: Secondary | ICD-10-CM

## 2012-07-18 DIAGNOSIS — E785 Hyperlipidemia, unspecified: Secondary | ICD-10-CM

## 2012-07-18 DIAGNOSIS — R030 Elevated blood-pressure reading, without diagnosis of hypertension: Secondary | ICD-10-CM

## 2012-07-18 DIAGNOSIS — I6350 Cerebral infarction due to unspecified occlusion or stenosis of unspecified cerebral artery: Secondary | ICD-10-CM

## 2012-07-18 DIAGNOSIS — Z8669 Personal history of other diseases of the nervous system and sense organs: Secondary | ICD-10-CM

## 2012-07-18 DIAGNOSIS — F419 Anxiety disorder, unspecified: Secondary | ICD-10-CM

## 2012-07-20 NOTE — Progress Notes (Signed)
  Subjective:    Patient ID: Tracie Roach, female    DOB: [birth year], 57 y.o.   MRN: [id number]  HPI Hospital followup for this 57 year old white female who presented to my office January 10th 2014 with left-sided numbness and slight muscle weakness of the left upper and left lower extremities. She had left-sided facial numbness as well. Was diagnosed with a right thalamic lacunar infarction on MRI. She was treated with aspirin 325 mg daily. Was started on statin medication. Is to followup with Dr. Pearlean Brownie 2 months post discharge. Says she thinks appointment is in April to see him. She is accompanied today by her daughter who is a Engineer, civil (consulting). Patient brings in multiple blood pressure readings from home which have been  good. High systolic pressure was 143 and lowest systolic pressure 122. Highest diastolic pressure 84 and lowest diastolic pressure 69. She is under a lot of stress with her mother who has been very ill and recently hospitalized. Mother upsets her at times. She has a history of migraine headaches. Neuro hospitalist placed her on amitriptyline 10 mg at bedtime. This seems to have helped frequency of headaches. She's also on magnesium as well as Zocor 20 mg daily. She has been taking vitamin B complex. She has Fioricet to take for persistent migraine headaches. Needs refill on this.  Still has persistent left facial numbness, left arm numbness, left leg numbness. Left trunk is numbness well. Feels some tightness in left upper shoulder and trunk. No shortness of breath. No chest pain.    Review of Systems     Objective:   Physical Exam skin is warm and dry. Chest clear to auscultation. Cardiac exam regular rate and rhythm normal S1 and S2. Extremities without edema. Neuro: Moves all 4 extremities without any problems. Decreased sensation to pinprick left face, left arm and left leg. Ambulates without assistance. Alert and oriented x3. Gait appears to be normal.        Assessment & Plan:   Right thalamic lacunar infarction  Hyperlipidemia-new prescription for Zocor 20 mg daily. Will need followup lipid panel and liver functions in a few weeks.  Migraine headaches-treated with Elavil with improvement. The prescription for Fioricet to have on hand as rescue medication.  Borderline hypertension-recent home blood pressure readings have been acceptable. Patient will continue to monitor blood pressure at home. Will recheck in 4 weeks. Long discussion with patient about handling of issues with her mother. She is anxious at times and daughter confirms that.  Anxiety-prescribe Xanax 0.25 mg one half to one by mouth twice daily as needed for anxiety associated with dealing with her mother  Return in 4 weeks. Time spent with patient and daughter 30 minutes.

## 2012-07-20 NOTE — Patient Instructions (Addendum)
Continue monitoring blood pressure at home. Return in 4 weeks. Keep appointment to see neurologist in a couple of months. Continue aspirin daily, Elavil daily, Zocor.

## 2012-08-20 ENCOUNTER — Encounter: Payer: Self-pay | Admitting: Internal Medicine

## 2012-08-20 ENCOUNTER — Ambulatory Visit (INDEPENDENT_AMBULATORY_CARE_PROVIDER_SITE_OTHER): Payer: PRIVATE HEALTH INSURANCE | Admitting: Internal Medicine

## 2012-08-20 VITALS — BP 126/80 | Temp 98.5°F | Wt 153.0 lb

## 2012-08-20 DIAGNOSIS — I6381 Other cerebral infarction due to occlusion or stenosis of small artery: Secondary | ICD-10-CM

## 2012-08-20 DIAGNOSIS — F411 Generalized anxiety disorder: Secondary | ICD-10-CM

## 2012-08-20 DIAGNOSIS — E785 Hyperlipidemia, unspecified: Secondary | ICD-10-CM

## 2012-08-20 DIAGNOSIS — N390 Urinary tract infection, site not specified: Secondary | ICD-10-CM

## 2012-08-20 DIAGNOSIS — Z8669 Personal history of other diseases of the nervous system and sense organs: Secondary | ICD-10-CM

## 2012-08-20 DIAGNOSIS — I6350 Cerebral infarction due to unspecified occlusion or stenosis of unspecified cerebral artery: Secondary | ICD-10-CM

## 2012-08-20 NOTE — Patient Instructions (Addendum)
Increase Elavil to 20 mg at bedtime. Return in 2 months.

## 2012-08-20 NOTE — Progress Notes (Signed)
  Subjective:    Patient ID: GEORGA STYS, female    DOB: Aug 06, 1955, 57 y.o.   MRN: [id number]  HPI 57 year old female with history of right lacunar thalamic infarction in today for followup. She also has long-standing history of migraine headaches and when hospitalized with infarction was placed on Elavil 10 mg at bedtime. Continues to have 2 or 3 headaches weekly. Has had several "bad" headaches since last office visit. Takes Fioricet when she has a bad headache. Trying to get control of stress in her life. Mother is still an issue. Sometimes takes Xanax for headache and that seems to help. Has followup appointment with neurologist in April. She's now on statin medication for hyperlipidemia. We'll plan to lipid panel liver functions in a couple of months. Blood pressure is excellent. Patient still experiencing numbness and tightness of left upper extremity particularly in left shoulder and upper arm and left hand. Also feeling numbness and tightness in toes and left knee. No weakness on left side. Feels his anxiety makes symptoms worse. Also recently had a urinary tract infection. She had some leftover Macrobid and took that. She had onset of sudden hematuria and dysuria.    Review of Systems     Objective:   Physical Exam Neck is supple without JVD, thyromegaly , or carotid bruits. Chest clear to auscultation. Cardiac exam regular rate and rhythm normal S1 and S2. Muscle strength in the left upper extremity is normal. Muscle strength left lower extremity is normal. Deep tendon reflexes on the left  are normal. Affect is slightly anxious.        Assessment & Plan:  Anxiety  History of migraine headaches  History of urinary tract infection  History of right thalamic lacunar infarction  Hyperlipidemia  Plan: Patient is to increase Elavil to 20 mg at bedtime. Continue to monitor frequency of headaches. Appointment with neurologist in April for followup of infarction. Return here in 2  months for lipid panel, liver functions and office visit. New prescription for Elavil 10 mg proceed #60) 2 by mouth each bedtime with refills.  Prescription for Macrobid 100 mg (#14) one by mouth twice a day for 7 days to have on hand for acute urinary tract infection

## 2012-09-25 ENCOUNTER — Encounter: Payer: Self-pay | Admitting: Neurology

## 2012-09-25 ENCOUNTER — Ambulatory Visit (INDEPENDENT_AMBULATORY_CARE_PROVIDER_SITE_OTHER): Payer: PRIVATE HEALTH INSURANCE | Admitting: Neurology

## 2012-09-25 ENCOUNTER — Other Ambulatory Visit: Payer: Self-pay | Admitting: *Deleted

## 2012-09-25 VITALS — BP 132/74 | HR 71 | Temp 98.0°F | Ht 67.0 in | Wt 152.0 lb

## 2012-09-25 DIAGNOSIS — I6350 Cerebral infarction due to unspecified occlusion or stenosis of unspecified cerebral artery: Secondary | ICD-10-CM

## 2012-09-25 DIAGNOSIS — R209 Unspecified disturbances of skin sensation: Secondary | ICD-10-CM

## 2012-09-25 NOTE — Patient Instructions (Signed)
i advised her to continue aspirin for stroke prevention and maintain strict control of lipids with LDL cholesterol goal below 100 mg percent. She was advised to increase amitriptyline to 20 mg for migraine prophylaxis and participate in stress relaxation activities and to limit her caffeine intake. Continue Fioricet for symptomatic relief of headaches. She was advised to return for followup in 3 months with nurse practitioner .

## 2012-09-25 NOTE — Progress Notes (Signed)
[facility] [address] [place]. [place] [address] [phone]       OFFICE FOLLOW-UP NOTE  Ms. LASHONDRA VAQUERANO Date of Birth:  Sep 11, 1955 Medical Record Number:  [id number]   HPI: Ms. 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 Vascular risk factor of hyperlipidemia only. She also has history of chronic migraine headaches.  She is seen  today for first office followup visit for hospital admission form stroke on 06/28/12. She presented with left hand and feet paresthesias. She was not a candidate for TPA due to the symptoms going on for 3-4 days and waxing and waning. CT scan of head was unremarkable but MRI scan showed a small right thalamic infarct. MRA of the brain and carotid Dopplers did not show large vessel stenosis. Transthoracic echo showed normal ejection fraction. She was started on aspirin for stroke prevention and a statin for her hyperlipidemia. She was restarted on amitriptyline for her chronic migraines. She states her migraines are somewhat improved but still occur about 4 times a month. Her primary physician and recently increased her amitriptyline to 20 mg at night. She continues to take Fioricet as needed which provides good relief. She does admit to significant stress due to the health of her mother who is now living with her and she is the caregiver. She does admit to drink a lot of caffeine but plans to cut back. She still has some paresthesias in the fingertips and feet this is annoying but not bothersome. She does not want specific medications for this at the present time. She is tolerating aspirin well but does have minor bruising. She denies significant myalgias on her statin. ROS:   14 system review of systems is positive for easy bruising, hearing loss on the left, constipation, joint pain, headache, numbness, weakness and anxiety. PMH:  Past Medical History  Diagnosis Date  . Headache   . Functional  constipation   . Hot flashes   . Postmenopausal   . Essential tremor   . High cholesterol     Social History:  History   Social History  . Marital Status: Married    Spouse Name: DANNIE WOOLEN    Number of Children: 2  . Years of Education: college   Occupational History  . Not on file.   Social History Main Topics  . Smoking status: Former Smoker -- 0.30 packs/day    Quit date: 08/27/2011  . Smokeless tobacco: Never Used  . Alcohol Use: No  . Drug Use: No  . Sexually Active: Not on file   Other Topics Concern  . Not on file   Social History Narrative  . No narrative on file    Medications:   Current Outpatient Prescriptions on File Prior to Visit  Medication Sig Dispense Refill  . ALPRAZolam (XANAX) 0.25 MG tablet Take 0.25 mg by mouth at bedtime as needed for sleep.      Marland Kitchen amitriptyline (ELAVIL) 10 MG tablet Take 1 tablet (10 mg total) by mouth at bedtime.  30 tablet  0  . aspirin EC 325 MG EC tablet Take 1 tablet (325 mg total) by mouth daily.      . B Complex-C (B-COMPLEX WITH VITAMIN C) tablet Take 1 tablet by mouth daily.      . butalbital-acetaminophen-caffeine (FIORICET, ESGIC) 50-325-40 MG per tablet Take 1 tablet by mouth 2 (two) times daily as needed. For migraines      . magnesium gluconate (  MAGONATE) 500 MG tablet Take 1 tablet (500 mg total) by mouth daily.      . Multiple Vitamins-Minerals (EYE VITAMINS) CAPS Take by mouth.      . Multiple Vitamins-Minerals (MULTIVITAMIN WITH MINERALS) tablet Take 1 tablet by mouth daily.      . simvastatin (ZOCOR) 20 MG tablet Take 1 tablet (20 mg total) by mouth daily at 6 PM.  30 tablet  0   No current facility-administered medications on file prior to visit.    Allergies:  No Known Allergies  Physical Exam General: well developed, well nourished young Caucasian lady, seated, in no evident distress Head: head normocephalic and atraumatic. Orohparynx benign Neck: supple with no carotid or supraclavicular  bruits Cardiovascular: regular rate and rhythm, no murmurs Musculoskeletal: no deformity Skin:  no rash/petichiae Vascular:  Normal pulses all extremities  Neurologic Exam Mental Status: Awake and fully alert. Oriented to place and time. Recent and remote memory intact. Attention span, concentration and fund of knowledge appropriate. Mood and affect appropriate.  Cranial Nerves: Fundoscopic exam reveals sharp disc margins. Pupils equal, briskly reactive to light. Extraocular movements full without nystagmus. Visual fields full to confrontation. Hearing intact. Facial sensation intact. Face, tongue, palate moves normally and symmetrically.  Motor: Normal bulk and tone. Normal strength in all tested extremity muscles. Sensory.: intact to tough and pinprick and vibratory. Subjective paresthesias in the left hand and toes but no objective sensory loss. Coordination: Rapid alternating movements normal in all extremities. Finger-to-nose and heel-to-shin performed accurately bilaterally. Gait and Station: Arises from chair without difficulty. Stance is normal. Gait demonstrates normal stride length and balance . Able to heel, toe and tandem walk without difficulty.  Reflexes: 1+ and symmetric. Toes downgoing.     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 She also has long-standing history of migraines.  PLAN: Continue has been physically stroke prevention with strict control of lipids his LDL cholesterol goal below 100 mg percent. Increase amitriptyline to 20 mg for migraine prophylaxis and use of Fioricet for symptomatic relief. I have advised patient to increase participation in activities for stress relaxation as well as cut down her intake of caffeine for migraine prevention. Return for followup in 3 months with nurse practitioner

## 2012-09-30 ENCOUNTER — Other Ambulatory Visit: Payer: Self-pay

## 2012-09-30 ENCOUNTER — Other Ambulatory Visit: Payer: Self-pay | Admitting: Internal Medicine

## 2012-09-30 MED ORDER — ALPRAZOLAM 0.25 MG PO TABS: 0.2500 mg | ORAL_TABLET | Freq: Every evening | ORAL | Status: DC | PRN

## 2012-09-30 NOTE — Telephone Encounter (Signed)
Please refill for 3 months  

## 2012-10-24 ENCOUNTER — Other Ambulatory Visit: Payer: PRIVATE HEALTH INSURANCE | Admitting: Internal Medicine

## 2012-10-24 DIAGNOSIS — E785 Hyperlipidemia, unspecified: Secondary | ICD-10-CM

## 2012-10-24 DIAGNOSIS — Z79899 Other long term (current) drug therapy: Secondary | ICD-10-CM

## 2012-10-24 LAB — HEPATIC FUNCTION PANEL
Albumin: 4.2 g/dL (ref 3.5–5.2)
Alkaline Phosphatase: 118 U/L — ABNORMAL HIGH (ref 39–117)
Indirect Bilirubin: 0.3 mg/dL (ref 0.0–0.9)
Total Bilirubin: 0.4 mg/dL (ref 0.3–1.2)

## 2012-10-24 LAB — LIPID PANEL
LDL Cholesterol: 93 mg/dL (ref 0–99)
VLDL: 14 mg/dL (ref 0–40)

## 2012-10-25 ENCOUNTER — Encounter: Payer: Self-pay | Admitting: Internal Medicine

## 2012-10-25 ENCOUNTER — Ambulatory Visit (INDEPENDENT_AMBULATORY_CARE_PROVIDER_SITE_OTHER): Payer: PRIVATE HEALTH INSURANCE | Admitting: Internal Medicine

## 2012-10-25 VITALS — BP 122/80 | HR 80 | Wt 152.0 lb

## 2012-10-25 DIAGNOSIS — E785 Hyperlipidemia, unspecified: Secondary | ICD-10-CM

## 2012-10-25 DIAGNOSIS — I639 Cerebral infarction, unspecified: Secondary | ICD-10-CM

## 2012-10-25 DIAGNOSIS — I6350 Cerebral infarction due to unspecified occlusion or stenosis of unspecified cerebral artery: Secondary | ICD-10-CM

## 2012-10-25 DIAGNOSIS — F411 Generalized anxiety disorder: Secondary | ICD-10-CM

## 2012-10-25 DIAGNOSIS — I6381 Other cerebral infarction due to occlusion or stenosis of small artery: Secondary | ICD-10-CM

## 2012-10-25 DIAGNOSIS — Z8669 Personal history of other diseases of the nervous system and sense organs: Secondary | ICD-10-CM

## 2012-10-25 NOTE — Progress Notes (Signed)
  Subjective:    Patient ID: Tracie Roach, female    DOB: 1955/11/17, 57 y.o.   MRN: [id number]  HPI  57 year old White female with migraine headaches,  Anxiety, s/p CVA (Thalamic Infarct).  Taking 20 mg Elavil generic hs for migraine prevention and doing much better. Only 2 migraines past month which is an improvement. Still c/o tightening in left upper extremity with exertion and anxiety. Basically she presented with left upper extremity numbness when thalamic infarct was discovered. It had been going on several days when she presented to the office. Frustrated with gardening. Left arm hurts with hoeing more than one row. BP today is excellent. Not on BP meds at present. Says BP is sometimes in the 140's but mostly 120's and 130's.  Sees Neurology Nurse practitioner August 18th. Saw Dr. Pearlean Brownie in April. See his note.He wants lipids tightly controlled. History of stress taking care of elderly mother.    Review of Systems     Objective:   Physical Exam Neck supple without thyromegaly. Chest clear to auscultation. Cardiac exam: regular rate and rhythm normal S1 and S2 .Neuro: Strength in left upper extremity is good. Lipid panel is within normal limits on lipid-lowering medication. Liver panel is normal. Skin is warm and dry. Affect is slightly anxious.        Assessment & Plan:  Hyperlipidemia-stable on statin medication  Migraine headaches-improved on Elavil  Status post thalamic infarct with residual left upper extremity paresthesias  Borderline hypertension  Anxiety-treated with Xanax  Plan: Continue same medications and return in 6 months.

## 2012-11-17 ENCOUNTER — Encounter: Payer: Self-pay | Admitting: Internal Medicine

## 2012-11-17 NOTE — Patient Instructions (Addendum)
Continue lipid-lowering medication. Continue Elavil to prevent migraine headaches. Continue anxiety medication. Return in 6 months.

## 2012-12-03 ENCOUNTER — Telehealth: Payer: Self-pay | Admitting: Internal Medicine

## 2012-12-03 NOTE — Telephone Encounter (Signed)
BP readings scanned into EMR under media.

## 2013-01-11 ENCOUNTER — Other Ambulatory Visit: Payer: Self-pay | Admitting: Internal Medicine

## 2013-01-25 ENCOUNTER — Other Ambulatory Visit: Payer: Self-pay | Admitting: Internal Medicine

## 2013-02-03 ENCOUNTER — Ambulatory Visit (INDEPENDENT_AMBULATORY_CARE_PROVIDER_SITE_OTHER): Payer: PRIVATE HEALTH INSURANCE | Admitting: Nurse Practitioner

## 2013-02-03 ENCOUNTER — Encounter: Payer: Self-pay | Admitting: Nurse Practitioner

## 2013-02-03 VITALS — BP 136/70 | HR 76 | Ht 66.5 in | Wt 157.0 lb

## 2013-02-03 DIAGNOSIS — R209 Unspecified disturbances of skin sensation: Secondary | ICD-10-CM

## 2013-02-03 DIAGNOSIS — I6350 Cerebral infarction due to unspecified occlusion or stenosis of unspecified cerebral artery: Secondary | ICD-10-CM

## 2013-02-03 NOTE — Patient Instructions (Addendum)
Continue aspirin for secondary stroke prevention Strict control of lipids with LDL below 100(reviewed recent labs LDL 93) Continue amitriptyline for migraine prophylaxis  Followup in 6 months

## 2013-02-03 NOTE — Progress Notes (Signed)
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 Vascular risk factor of hyperlipidemia only. She also has history of chronic migraine headaches.  She was last seen by Dr. Pearlean Brownie 09/25/12 for first office followup visit for hospital admission form stroke on 06/28/12. She presented with left hand and feet paresthesias. She was not a candidate for TPA due to the symptoms going on for 3-4 days and waxing and waning. CT scan of head was unremarkable but MRI scan showed a small right thalamic infarct. MRA of the brain and carotid Dopplers did not show large vessel stenosis. Transthoracic echo showed normal ejection fraction. She was started on aspirin for stroke prevention and a statin for her hyperlipidemia. She was restarted on amitriptyline for her chronic migraines. She states her migraines are somewhat improved but still occur about 4 times a month. Her primary physician had recently increased her amitriptyline to 20 mg at night. She continues to take Fioricet as needed which provides good relief. She does admit to significant stress due to the health of her mother who is now living with her and she is the caregiver. She has cut back on caffeine. She still has some paresthesias in the fingertips and left arm. This is annoying but not bothersome. She does not want specific medications for this at the present time. She is tolerating aspirin well but does have minor bruising. She denies significant myalgias on her statin.   ROS:  Negative except for hot flashes , occasional headache, and paresthesias in left arm and hand   Medications Current Outpatient Prescriptions on File Prior to Visit  Medication Sig Dispense Refill  . ALPRAZolam (XANAX) 0.25 MG tablet Take 1 tablet (0.25 mg total) by mouth at bedtime as needed for sleep.  30 tablet  2  . amitriptyline (ELAVIL) 10 MG tablet TAKE 2 TABLETS AT BEDTIME FOR  MIGRAINE PROPHYLAXIS  60 tablet  11  . aspirin EC 325 MG EC tablet Take 1 tablet (325 mg total) by mouth daily.      . B Complex-C (B-COMPLEX WITH VITAMIN C) tablet Take 1 tablet by mouth daily.      . butalbital-acetaminophen-caffeine (FIORICET, ESGIC) 50-325-40 MG per tablet Take 1 tablet by mouth 2 (two) times daily as needed. For migraines      . magnesium gluconate (MAGONATE) 500 MG tablet Take 1 tablet (500 mg total) by mouth daily.      . Multiple Vitamins-Minerals (EYE VITAMINS) CAPS Take by mouth.      . Multiple Vitamins-Minerals (MULTIVITAMIN WITH MINERALS) tablet Take 1 tablet by mouth daily.      . simvastatin (ZOCOR) 20 MG tablet TAKE 1 TABLET BY MOUTH EVERY DAY  30 tablet  5   No current facility-administered medications on file prior to visit.    Allergies No Known Allergies  Physical Exam General: well developed, well nourished, seated, in no evident distress Head: head normocephalic and atraumatic. Oropharynx benign Neck: supple with no carotid  bruits Cardiovascular: regular rate and rhythm, no murmurs  Neurologic Exam Mental Status: Awake and fully alert. Oriented to place and time. Follows all commands. Speech and language normal.   Cranial Nerves: Fundoscopic exam reveals sharp disc margins. Pupils equal, briskly reactive to light. Extraocular movements full without nystagmus. Visual fields full to confrontation. Hearing intact and symmetric to finger snap. Facial sensation intact. Face, tongue, palate move normally and symmetrically. Neck flexion  and extension normal.  Motor: Normal bulk and tone. Normal strength in all tested extremity muscles.No focal weakness Sensory.: intact to touch and pinprick and vibratory. Subjective paresthesias in the left hand and arm Coordination: Rapid alternating movements normal in all extremities. Finger-to-nose and heel-to-shin performed accurately bilaterally. Gait and Station: Arises from chair without difficulty. Stance is normal.  Gait demonstrates normal stride length and balance . Able to heel, toe and tandem walk without difficulty.  Reflexes: 1+ and symmetric. Toes downgoing.     ASSESSMENT: Small right thalamic infarct in January 2014 secondary to small vessel disease with vascular risk factors of hyperlipidemia.     PLAN: Continue aspirin for secondary stroke prevention Strict control of lipids with LDL below 100(reviewed recent labs LDL 93) Continue amitriptyline for migraine prophylaxis  Followup in 6 months Tracie Roach, GNP-BC APRN

## 2013-02-07 ENCOUNTER — Ambulatory Visit (INDEPENDENT_AMBULATORY_CARE_PROVIDER_SITE_OTHER): Payer: PRIVATE HEALTH INSURANCE | Admitting: Internal Medicine

## 2013-02-07 ENCOUNTER — Encounter: Payer: Self-pay | Admitting: Internal Medicine

## 2013-02-07 VITALS — BP 136/84 | Temp 98.8°F | Wt 153.5 lb

## 2013-02-07 DIAGNOSIS — R590 Localized enlarged lymph nodes: Secondary | ICD-10-CM

## 2013-02-07 DIAGNOSIS — M26629 Arthralgia of temporomandibular joint, unspecified side: Secondary | ICD-10-CM

## 2013-02-07 DIAGNOSIS — L219 Seborrheic dermatitis, unspecified: Secondary | ICD-10-CM

## 2013-02-07 DIAGNOSIS — R599 Enlarged lymph nodes, unspecified: Secondary | ICD-10-CM

## 2013-02-07 DIAGNOSIS — M2669 Other specified disorders of temporomandibular joint: Secondary | ICD-10-CM

## 2013-02-07 DIAGNOSIS — H109 Unspecified conjunctivitis: Secondary | ICD-10-CM

## 2013-02-07 MED ORDER — KETOCONAZOLE 2 % EX SHAM: MEDICATED_SHAMPOO | CUTANEOUS | Status: DC

## 2013-02-07 MED ORDER — OFLOXACIN 0.3 % OP SOLN: 1.0000 [drp] | Freq: Four times a day (QID) | OPHTHALMIC | Status: DC

## 2013-02-07 MED ORDER — AZITHROMYCIN 250 MG PO TABS: ORAL_TABLET | ORAL | Status: DC

## 2013-02-07 NOTE — Progress Notes (Signed)
  Subjective:    Patient ID: Tracie Roach, female    DOB: 23-Sep-1955, 57 y.o.   MRN: [id number]  HPI  Patient complaining of prominent lymph nodes left neck and behind left ear. Has had some discomfort anterior scalp. Thinks it came from being out in the sun mowing the yard. She has a history of thalamic stroke, migraine headaches, and hyperlipidemia. No sore throat. Feels a bit nasally congested. No fever or shaking chills. Some headache several days as week. Is on Elavil for headaches. History of anxiety for which she takes Xanax. Patient is worried about developing shingles.    Review of Systems     Objective:   Physical Exam she has an  approximate 1 cm left posterior cervical lymph node. This is slightly tender to touch. Has smaller lymph node behind left ear. Has erythema be an anterior scalp consistent with seborrhea. Has some erythema above left eyelid. No scale noted there. She is alert and oriented. TMs are clear. Neck is supple. Chest is clear. No lesions consistent with Herpes zoster at this point in time. Tender left TM joint. Left conjunctiva inflamed.        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 Use pain medication sparingly for headache (Fioricet). Continue Elavil for headache control. Nizoral shampoo to use twice weekly on scalp. Ofloxacin ophthalmic drops 2 drops left eye 4 times a day for 5 days.

## 2013-02-07 NOTE — Patient Instructions (Addendum)
Use ocuflox eye drops as directed. Take Zithromax Z pak as directed. Use Nizoral shampoo twice weekly. Return if not better in one week or sooner if worse. Apply ice to TM joint.

## 2013-02-13 ENCOUNTER — Ambulatory Visit (INDEPENDENT_AMBULATORY_CARE_PROVIDER_SITE_OTHER): Payer: PRIVATE HEALTH INSURANCE | Admitting: Internal Medicine

## 2013-02-13 ENCOUNTER — Encounter: Payer: Self-pay | Admitting: Internal Medicine

## 2013-02-13 VITALS — BP 116/76 | Temp 97.6°F | Wt 153.0 lb

## 2013-02-13 DIAGNOSIS — B029 Zoster without complications: Secondary | ICD-10-CM

## 2013-02-13 LAB — CBC WITH DIFFERENTIAL/PLATELET
Basophils Absolute: 0 10*3/uL (ref 0.0–0.1)
Lymphocytes Relative: 35 % (ref 12–46)
Lymphs Abs: 2.3 10*3/uL (ref 0.7–4.0)
MCV: 90.4 fL (ref 78.0–100.0)
Neutro Abs: 3.8 10*3/uL (ref 1.7–7.7)
Platelets: 290 10*3/uL (ref 150–400)
RBC: 4.46 MIL/uL (ref 3.87–5.11)
RDW: 13 % (ref 11.5–15.5)
WBC: 6.8 10*3/uL (ref 4.0–10.5)

## 2013-02-13 MED ORDER — VALACYCLOVIR HCL 1 G PO TABS: 1000.0000 mg | ORAL_TABLET | Freq: Three times a day (TID) | ORAL | Status: DC

## 2013-02-13 MED ORDER — HYDROCODONE-ACETAMINOPHEN 5-325 MG PO TABS: 1.0000 | ORAL_TABLET | Freq: Four times a day (QID) | ORAL | Status: DC | PRN

## 2013-02-13 NOTE — Progress Notes (Addendum)
  Subjective:    Patient ID: Tracie Roach, female    DOB: [birth year], 57 y.o.   MRN: [id number]  HPI Patient returns today complaining of persistent lymphadenopathy left cervical area. Also says she has swelling under her left eye. Has area of of left eye it looks like an abrasion which is healing. Last visit, I saw no vesicles to suggest herpes zoster. However this is now appearing like it might be herpes zoster. Says her left eye sometimes feels a bit irritated. There is no drainage from the left eye no significant redness. She also had some lesions extending into her left scalp last visit which I thought was seborrhea. They certainly were not vesicular. It would nail. She's had a bout of herpes zoster. Has some slight headache. This appears to be relieved with hydrocodone a Pap which she had left over from another prescription. At last visit was placed on antibiotics.    Review of Systems     Objective:   Physical Exam has healing small abraded area above left eye. Tender left posterior cervical area without significant lymphadenopathy. Extraocular movements are full. PERRLA. Conjunctivae are not injected. Also has some erythema near abraded area.        Assessment & Plan:  Probable herpes zoster  Anxiety  History of stroke  Plan: Drew CBC with differential and herpes zoster titer (DNA PCR) start on Valtrex 1 g 3 times daily for 7 days. Norco 5/325 #30 one by mouth each bedtime when necessary headache. Appointment with Dr. Harvie Bridge  in [place] for eye exam. Time spent with patient 25 minutes.  Add: Appt with Dr. Harvie Bridge at 1:40 pm today

## 2013-02-13 NOTE — Patient Instructions (Addendum)
Take Valtrex 1 g 3 times daily. Take Norco 5/320 5 at night for headache. See eye physician in [place] as soon as possible.

## 2013-02-15 LAB — VARICELLA-ZOSTER BY PCR: VZV DNA, QL PCR: NOT DETECTED

## 2013-02-24 ENCOUNTER — Other Ambulatory Visit: Payer: Self-pay | Admitting: Internal Medicine

## 2013-02-24 NOTE — Telephone Encounter (Signed)
Please refill for 3 months  

## 2013-04-24 ENCOUNTER — Other Ambulatory Visit: Payer: Self-pay | Admitting: Internal Medicine

## 2013-04-24 NOTE — Telephone Encounter (Signed)
Refill once 

## 2013-06-30 ENCOUNTER — Other Ambulatory Visit: Payer: PRIVATE HEALTH INSURANCE | Admitting: Internal Medicine

## 2013-06-30 DIAGNOSIS — E785 Hyperlipidemia, unspecified: Secondary | ICD-10-CM

## 2013-06-30 DIAGNOSIS — Z79899 Other long term (current) drug therapy: Secondary | ICD-10-CM

## 2013-06-30 DIAGNOSIS — Z13228 Encounter for screening for other metabolic disorders: Secondary | ICD-10-CM

## 2013-06-30 DIAGNOSIS — Z1329 Encounter for screening for other suspected endocrine disorder: Secondary | ICD-10-CM

## 2013-06-30 DIAGNOSIS — Z130 Encounter for screening for diseases of the blood and blood-forming organs and certain disorders involving the immune mechanism: Secondary | ICD-10-CM

## 2013-06-30 LAB — LIPID PANEL
CHOLESTEROL: 160 mg/dL (ref 0–200)
HDL: 46 mg/dL (ref >39)
LDL CALC: 93 mg/dL (ref 0–99)
TRIGLYCERIDES: 104 mg/dL (ref <150)
Total CHOL/HDL Ratio: 3.5 Ratio
VLDL: 21 mg/dL (ref 0–40)

## 2013-06-30 LAB — CBC WITH DIFFERENTIAL/PLATELET
BASOS PCT: 1 % (ref 0–1)
Basophils Absolute: 0 10*3/uL (ref 0.0–0.1)
EOS ABS: 0.3 10*3/uL (ref 0.0–0.7)
Eosinophils Relative: 4 % (ref 0–5)
HEMATOCRIT: 41.4 % (ref 36.0–46.0)
HEMOGLOBIN: 14 g/dL (ref 12.0–15.0)
Lymphocytes Relative: 35 % (ref 12–46)
Lymphs Abs: 2.4 10*3/uL (ref 0.7–4.0)
MCH: 30.4 pg (ref 26.0–34.0)
MCHC: 33.8 g/dL (ref 30.0–36.0)
MCV: 89.8 fL (ref 78.0–100.0)
MONO ABS: 0.5 10*3/uL (ref 0.1–1.0)
MONOS PCT: 8 % (ref 3–12)
Neutro Abs: 3.6 10*3/uL (ref 1.7–7.7)
Neutrophils Relative %: 52 % (ref 43–77)
Platelets: 350 10*3/uL (ref 150–400)
RBC: 4.61 MIL/uL (ref 3.87–5.11)
RDW: 13 % (ref 11.5–15.5)
WBC: 6.8 10*3/uL (ref 4.0–10.5)

## 2013-06-30 LAB — COMPREHENSIVE METABOLIC PANEL
ALBUMIN: 4.2 g/dL (ref 3.5–5.2)
ALK PHOS: 104 U/L (ref 39–117)
ALT: 14 U/L (ref 0–35)
AST: 18 U/L (ref 0–37)
BILIRUBIN TOTAL: 0.3 mg/dL (ref 0.3–1.2)
BUN: 10 mg/dL (ref 6–23)
CO2: 33 meq/L — AB (ref 19–32)
Calcium: 9.2 mg/dL (ref 8.4–10.5)
Chloride: 102 mEq/L (ref 96–112)
Creat: 0.77 mg/dL (ref 0.50–1.10)
GLUCOSE: 99 mg/dL (ref 70–99)
Potassium: 4.3 mEq/L (ref 3.5–5.3)
Sodium: 140 mEq/L (ref 135–145)
Total Protein: 6.6 g/dL (ref 6.0–8.3)

## 2013-06-30 LAB — TSH: TSH: 2.981 u[IU]/mL (ref 0.350–4.500)

## 2013-07-01 LAB — VITAMIN D 25 HYDROXY (VIT D DEFICIENCY, FRACTURES): Vit D, 25-Hydroxy: 49 ng/mL (ref 30–89)

## 2013-07-04 ENCOUNTER — Ambulatory Visit (INDEPENDENT_AMBULATORY_CARE_PROVIDER_SITE_OTHER): Payer: PRIVATE HEALTH INSURANCE | Admitting: Internal Medicine

## 2013-07-04 ENCOUNTER — Other Ambulatory Visit (HOSPITAL_COMMUNITY)
Admission: RE | Admit: 2013-07-04 | Discharge: 2013-07-04 | Disposition: A | Discharge status: Home / Self Care | Payer: No Typology Code available for payment source | Source: Ambulatory Visit | Attending: Internal Medicine | Admitting: Internal Medicine

## 2013-07-04 ENCOUNTER — Encounter: Payer: Self-pay | Admitting: Internal Medicine

## 2013-07-04 VITALS — BP 130/90 | HR 88 | Temp 99.4°F | Ht 65.0 in | Wt 160.0 lb

## 2013-07-04 DIAGNOSIS — Z0000 Encounter for general adult medical examination without abnormal findings: Secondary | ICD-10-CM

## 2013-07-04 DIAGNOSIS — Z01419 Encounter for gynecological examination (general) (routine) without abnormal findings: Secondary | ICD-10-CM | POA: Insufficient documentation

## 2013-07-04 DIAGNOSIS — R0989 Other specified symptoms and signs involving the circulatory and respiratory systems: Secondary | ICD-10-CM

## 2013-07-04 DIAGNOSIS — R030 Elevated blood-pressure reading, without diagnosis of hypertension: Secondary | ICD-10-CM

## 2013-07-04 DIAGNOSIS — Z8673 Personal history of transient ischemic attack (TIA), and cerebral infarction without residual deficits: Secondary | ICD-10-CM

## 2013-07-04 DIAGNOSIS — Z87440 Personal history of urinary (tract) infections: Secondary | ICD-10-CM

## 2013-07-04 LAB — POCT URINALYSIS DIPSTICK
BILIRUBIN UA: NEGATIVE
Glucose, UA: NEGATIVE
KETONES UA: NEGATIVE
LEUKOCYTES UA: NEGATIVE
Nitrite, UA: NEGATIVE
Protein, UA: NEGATIVE
RBC UA: NEGATIVE
Spec Grav, UA: <=1.005
Urobilinogen, UA: NEGATIVE
pH, UA: 7.5

## 2013-07-04 MED ORDER — NITROFURANTOIN MONOHYD MACRO 100 MG PO CAPS: 100.0000 mg | ORAL_CAPSULE | Freq: Two times a day (BID) | ORAL | Status: DC

## 2013-07-04 MED ORDER — BUTALBITAL-APAP-CAFFEINE 50-325-40 MG PO TABS: ORAL_TABLET | ORAL | Status: DC

## 2013-07-04 MED ORDER — HYDROCODONE-ACETAMINOPHEN 5-325 MG PO TABS: 1.0000 | ORAL_TABLET | Freq: Four times a day (QID) | ORAL | Status: DC | PRN

## 2013-07-04 MED ORDER — ALPRAZOLAM 0.25 MG PO TABS: ORAL_TABLET | ORAL | Status: DC

## 2013-07-04 NOTE — Patient Instructions (Addendum)
Keep up with blood pressure readings regularly. Call if persistently elevated. Otherwise return in 6 months. Continue other medications as previously prescribed. Continue to see neurologist.

## 2013-07-04 NOTE — Progress Notes (Signed)
   Subjective:    Patient ID: Tracie Roach, female    DOB: 30-Dec-1955, 58 y.o.   MRN: [id number]  HPI 58 year old Female for health maintenance and evaluation of medical issues. History of weight extra January 2014 with left upper extremity numbness and tightness which has persisted. She is followed by neurologist. She also has a history of frequent migraine headaches. History of essential tremor. History of constipation.  No known drug allergies.  Past medical history: Occasional urinary tract infections. Appendectomy April 2011. History of hyperlipidemia and is now on statin medication.  Family history: Father died in 2009 with pulmonary fibrosis. Mother living with history of CABG and valve replacement. One sister in good health. No brothers.  Social history: Married with 2 adult children. Does not smoke or consume alcohol. Quit smoking in March 2013. Used to smoke 6 or 7 cigarettes daily.    Review of Systems  Constitutional: Positive for fatigue.  Cardiovascular:       Hypertension  Endocrine:       Hyperlipidemia  Genitourinary:       Occasional urinary infections  Neurological:       Persistent numbness left upper extremity with tightness  History of migraine headaches  Psychiatric/Behavioral:       Anxiety       Objective:   Physical Exam  Vitals reviewed. Constitutional: She is oriented to person, place, and time. She appears well-developed and well-nourished. No distress.  HENT:  Head: Normocephalic and atraumatic.  Right Ear: External ear normal.  Left Ear: External ear normal.  Mouth/Throat: Oropharynx is clear and moist. No oropharyngeal exudate.  Eyes: Conjunctivae and EOM are normal. Pupils are equal, round, and reactive to light. Right eye exhibits no discharge. Left eye exhibits no discharge. No scleral icterus.  Bilateral lenticular densities consistent with cataracts  Neck: Neck supple. No JVD present. No thyromegaly present.  Cardiovascular: Normal  rate, normal heart sounds and intact distal pulses.   No murmur heard. Pulmonary/Chest: Effort normal and breath sounds normal. No respiratory distress. She has no wheezes. She has no rales.  Breasts normal female  Abdominal: Soft. Bowel sounds are normal. She exhibits no distension and no mass. There is no tenderness. There is no rebound and no guarding.  Genitourinary:  Pap taken. Bimanual normal.  Musculoskeletal: Normal range of motion. She exhibits no edema.  Lymphadenopathy:    She has no cervical adenopathy.  Neurological: She is alert and oriented to person, place, and time. She has normal reflexes. Coordination normal.  Skin: Skin is warm and dry. No rash noted. She is not diaphoretic.  Psychiatric: She has a normal mood and affect. Her behavior is normal. Judgment and thought content normal.          Assessment & Plan:  History of right thalamic infarct with persistent left upper extremity numbness and sensation of tightness  History of migraine headaches  Elevated blood pressure-concern she is developing hypertension  Hyperlipidemia  Anxiety  History of constipation  Plan: Continue statin medication. Is taking amitriptyline at night for migraine headaches. Takes Fioricet sparingly for migraine headaches. Takes Xanax for anxiety. Monitor blood pressure and return in 4-6 months or sooner if blood pressure remains labile.

## 2013-07-07 ENCOUNTER — Telehealth: Payer: Self-pay | Admitting: Internal Medicine

## 2013-07-07 NOTE — Telephone Encounter (Signed)
Faxed documentation to Dr. Shelba FlakeLandau's office @ [phone].  Spoke with Dr. Lenord FellersBaxley to advise that Dr. Stark JockSypher's office doesn't accept patient's commercial insurance - so patient seeing Landau instead of Sypher.

## 2013-07-21 ENCOUNTER — Other Ambulatory Visit: Payer: Self-pay | Admitting: Internal Medicine

## 2013-08-11 ENCOUNTER — Encounter: Payer: Self-pay | Admitting: Nurse Practitioner

## 2013-08-11 ENCOUNTER — Ambulatory Visit (INDEPENDENT_AMBULATORY_CARE_PROVIDER_SITE_OTHER): Payer: 59 | Admitting: Nurse Practitioner

## 2013-08-11 VITALS — BP 132/76 | HR 70 | Ht 66.0 in | Wt 157.0 lb

## 2013-08-11 DIAGNOSIS — R209 Unspecified disturbances of skin sensation: Secondary | ICD-10-CM

## 2013-08-11 DIAGNOSIS — I6350 Cerebral infarction due to unspecified occlusion or stenosis of unspecified cerebral artery: Secondary | ICD-10-CM

## 2013-08-11 NOTE — Progress Notes (Signed)
[facility]  PATIENT: Tracie Roach DOB: Aug 25, 1955   REASON FOR VISIT: Followup thalamic  Stroke and parathesias   HISTORY OF PRESENT ILLNESS: Tracie Roach, 58 year old female returns for followup. 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 She has not had further stroke or  TIA symptoms. She is currently on aspirin 325 daily. She continues to have paresthesias of the left hand and arm she has had these since her stroke. They're not disabling just annoying. They occur more frequently when she is fatigued. Her recent labs with good lipid profile. She had a bout of shingles since last seen. 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 Vascular risk factor of hyperlipidemia only. She also has history of chronic migraine headaches.  She was last seen by Dr. Pearlean BrownieSethi 09/25/12 for first office followup visit for hospital admission form stroke on 06/28/12. She presented with left hand and feet paresthesias. She was not a candidate for TPA due to the symptoms going on for 3-4 days and waxing and waning. CT scan of head was unremarkable but MRI scan showed a small right thalamic infarct. MRA of the brain and carotid Dopplers did not show large vessel stenosis. Transthoracic echo showed normal ejection fraction. She was started on aspirin for stroke prevention and a statin for her hyperlipidemia. She was restarted on amitriptyline for her chronic migraines. She states her migraines are somewhat improved but still occur about 4 times a month. Her primary physician had recently increased her amitriptyline to 20 mg at night. She continues to take Fioricet as needed which provides good relief. She does admit to significant stress due to the health of her mother who is now living with her and she is the caregiver. She has cut back on caffeine. She still has some paresthesias in the fingertips and left arm. This is annoying but not  bothersome. She does not want specific medications for this at the present time. She is tolerating aspirin well but does have minor bruising. She denies significant myalgias on her statin.  REVIEW OF SYSTEMS: Full 14 system review of systems performed and notable only for those listed, all others are neg:  Constitutional: N/A  Cardiovascular: N/A  Ear/Nose/Throat: N/A  Skin: N/A  Eyes: N/A  Respiratory: N/A  Gastroitestinal: N/A  Hematology/Lymphatic: N/A  Endocrine: N/A Musculoskeletal:joint pain Allergy/Immunology: N/A  Neurological: numbness Psychiatric: N/A   ALLERGIES: No Known Allergies  HOME MEDICATIONS: Outpatient Prescriptions Prior to Visit  Medication Sig Dispense Refill  . ALPRAZolam (XANAX) 0.25 MG tablet TAKE 1 TABLET AT BEDTIME AS NEEDED  30 tablet  5  . amitriptyline (ELAVIL) 10 MG tablet TAKE 2 TABLETS AT BEDTIME FOR MIGRAINE PROPHYLAXIS  60 tablet  11  . aspirin EC 325 MG EC tablet Take 1 tablet (325 mg total) by mouth daily.      . B Complex-C (B-COMPLEX WITH VITAMIN C) tablet Take 1 tablet by mouth daily.      . butalbital-acetaminophen-caffeine (FIORICET, ESGIC) 50-325-40 MG per tablet TAKE 1 TO 2 CAPSULES BY MOUTH EVERY 12 HOURS AS NEEDED FOR HEADACHES  60 tablet  2  . CALCIUM PO Take by mouth.      Marland Kitchen. HYDROcodone-acetaminophen (NORCO) 5-325 MG per tablet Take 1 tablet by mouth every 6 (six) hours as needed.  90 tablet  0  . ketoconazole (NIZORAL) 2 % shampoo Apply topically 2 (two) times a week.  120 mL  0  .  magnesium gluconate (MAGONATE) 500 MG tablet Take 1 tablet (500 mg total) by mouth daily.      . Multiple Vitamins-Minerals (EYE VITAMINS) CAPS Take by mouth.      . Multiple Vitamins-Minerals (MULTIVITAMIN WITH MINERALS) tablet Take 1 tablet by mouth daily.      . polyethylene glycol (MIRALAX / GLYCOLAX) packet Take 17 g by mouth every other day.      . simvastatin (ZOCOR) 20 MG tablet TAKE 1 TABLET BY MOUTH EVERY DAY  30 tablet  5  . nitrofurantoin,  macrocrystal-monohydrate, (MACROBID) 100 MG capsule Take 1 capsule (100 mg total) by mouth 2 (two) times daily.  14 capsule  0  . valACYclovir (VALTREX) 1000 MG tablet Take 1 tablet (1,000 mg total) by mouth 3 (three) times daily.  21 tablet  0  . simvastatin (ZOCOR) 20 MG tablet TAKE 1 TABLET BY MOUTH EVERY DAY  30 tablet  5   No facility-administered medications prior to visit.    PAST MEDICAL HISTORY: Past Medical History  Diagnosis Date  . Headache(784.0)   . Functional constipation   . Hot flashes   . Postmenopausal   . Essential tremor   . High cholesterol   . Stroke     PAST SURGICAL HISTORY: Past Surgical History  Procedure Laterality Date  . Appendectomy      FAMILY HISTORY: Family History  Problem Relation Age of Onset  . Heart disease Mother     SOCIAL HISTORY: History   Social History  . Marital Status: Married    Spouse Name: DRINDA BELGARD    Number of Children: 2  . Years of Education: college   Occupational History  . Not on file.   Social History Main Topics  . Smoking status: Former Smoker -- 0.30 packs/day    Quit date: 08/27/2011  . Smokeless tobacco: Never Used  . Alcohol Use: No  . Drug Use: No  . Sexual Activity: Not on file   Other Topics Concern  . Not on file   Social History Narrative   Patient lives with husband [name].    Patient has 2 children.    Patient is a homemaker.    Patient has 2 years of community college.      PHYSICAL EXAM  Filed Vitals:   08/11/13 1337  BP: 137/74  Pulse: 70  Height: 5\' 6"  (1.676 m)  Weight: 157 lb (71.215 kg)   Body mass index is 25.35 kg/(m^2).  Generalized: Well developed, in no acute distress  Head: normocephalic and atraumatic,. Oropharynx benign  Neck: Supple, no carotid bruits  Cardiac: Regular rate rhythm, no murmur  Musculoskeletal: No deformity   Neurological examination   Mentation: Alert oriented to time, place, history taking. Follows all commands speech and language  fluent  Cranial nerve II-XII: Pupils were equal round reactive to light extraocular movements were full, visual field were full on confrontational test. Facial sensation and strength were normal. hearing was intact to finger rubbing bilaterally. Uvula tongue midline. head turning and shoulder shrug were normal and symmetric.Tongue protrusion into cheek strength was normal. Motor: normal bulk and tone, full strength in the BUE, BLE, fine finger movements normal, no pronator drift. No focal weakness Sensory: normal and symmetric to light touch, pinprick, and  Vibration.  Subjective paresthesias in the left hand and arm .   Coordination: finger-nose-finger, heel-to-shin bilaterally, no dysmetria Reflexes: 1+ upper and lower symmetric  Gait and Station: Rising up from seated position without assistance, normal stance,  moderate  stride, good arm swing, smooth turning, able to perform tiptoe, and heel walking without difficulty. Tandem gait is steady  DIAGNOSTIC DATA (LABS, IMAGING, TESTING) - I reviewed patient records, labs, notes, testing and imaging myself where available.  Lab Results  Component Value Date   WBC 6.8 06/30/2013   HGB 14.0 06/30/2013   HCT 41.4 06/30/2013   MCV 89.8 06/30/2013   PLT 350 06/30/2013      Component Value Date/Time   NA 140 06/30/2013 0946   K 4.3 06/30/2013 0946   CL 102 06/30/2013 0946   CO2 33* 06/30/2013 0946   GLUCOSE 99 06/30/2013 0946   BUN 10 06/30/2013 0946   CREATININE 0.77 06/30/2013 0946   CREATININE 0.69 06/29/2012 0242   CALCIUM 9.2 06/30/2013 0946   PROT 6.6 06/30/2013 0946   ALBUMIN 4.2 06/30/2013 0946   AST 18 06/30/2013 0946   ALT 14 06/30/2013 0946   ALKPHOS 104 06/30/2013 0946   BILITOT 0.3 06/30/2013 0946   GFRNONAA >90 06/29/2012 0242   GFRAA >90 06/29/2012 0242   Lab Results  Component Value Date   CHOL 160 06/30/2013   HDL 46 06/30/2013   LDLCALC 93 06/30/2013   TRIG 104 06/30/2013   CHOLHDL 3.5 06/30/2013    Lab Results  Component Value Date     TSH 2.981 06/30/2013      ASSESSMENT AND PLAN  58 y.o. year old female  has a past medical history of Headache(784.0);High cholesterol; and right thalmic infarct in Jan 2014 secondary to vessel disease with vascular risk factors of hyperlipidemia here to follow up.   Continue ASA for secondary stroke prevention Control of lipids with LDL below 100 Continue Amitriptyline for migraine prophylaxis Check carotid doppler at next visit Follow up in 6 months  Nilda Riggs, [facility], [facility], APRN  [facility] [address] [place], [place] [address] [phone]

## 2013-08-11 NOTE — Patient Instructions (Signed)
Continue ASA for secondary stroke prevention Control of lipids with LDL below 100 Continue Amitriptyline for migraine prophylaxis Follow up in 6 months

## 2013-09-28 ENCOUNTER — Other Ambulatory Visit: Payer: Self-pay | Admitting: Internal Medicine

## 2013-09-28 NOTE — Telephone Encounter (Signed)
Refill x 6 months 

## 2013-10-09 ENCOUNTER — Encounter: Payer: Self-pay | Admitting: Internal Medicine

## 2013-10-09 ENCOUNTER — Ambulatory Visit (INDEPENDENT_AMBULATORY_CARE_PROVIDER_SITE_OTHER): Payer: 59 | Admitting: Internal Medicine

## 2013-10-09 VITALS — BP 128/84 | HR 84 | Temp 98.2°F | Wt 158.0 lb

## 2013-10-09 DIAGNOSIS — E785 Hyperlipidemia, unspecified: Secondary | ICD-10-CM

## 2013-10-09 DIAGNOSIS — Z8669 Personal history of other diseases of the nervous system and sense organs: Secondary | ICD-10-CM

## 2013-10-09 DIAGNOSIS — Z8673 Personal history of transient ischemic attack (TIA), and cerebral infarction without residual deficits: Secondary | ICD-10-CM

## 2013-10-09 DIAGNOSIS — I10 Essential (primary) hypertension: Secondary | ICD-10-CM | POA: Insufficient documentation

## 2013-10-09 DIAGNOSIS — Z0000 Encounter for general adult medical examination without abnormal findings: Secondary | ICD-10-CM

## 2013-10-09 DIAGNOSIS — F411 Generalized anxiety disorder: Secondary | ICD-10-CM

## 2013-10-09 MED ORDER — LOSARTAN POTASSIUM 50 MG PO TABS: 50.0000 mg | ORAL_TABLET | Freq: Every day | ORAL | Status: DC

## 2013-10-09 NOTE — Progress Notes (Signed)
   Subjective:    Patient ID: Tracie Roach, female    DOB: 1956/06/08, 58 y.o.   MRN: [id number]  HPI In today for followup of fluctuating blood pressure values. She called recently saying that blood pressure was elevated at times in late March and early April. She was obtaining systolic readings as high as 175 once but more in the range of 140-150 systolically and 80 to 91 diastolically. Says she's had more headaches recently which she attributed to allergies and sinus issues. She saw Darrol Angelarolyn Martin, nurse practitioner in February for followup of CVA.Marland Kitchen. Has repeat appointment in August and says carotid Dopplers are to be done at that time. She is on lipid-lowering medication. She takes aspirin daily. She takes amitriptyline to control migraine headaches. Still has some stress and anxiety with mother and other family issues. Try to help some get house built.    Review of Systems     Objective:   Physical Exam Neck is supple without JVD thyromegaly or carotid bruits. Chest clear to auscultation. Cardiac exam regular rate and rhythm normal S1 and S2. Extremities without edema. Skin warm and dry. Spoke about stress and how to control it        Assessment & Plan:  Labile blood pressure  History of CVA  Migraine headaches  Anxiety  Plan: Start losartan 50 mg daily and return in 6 weeks for office visit blood pressure check and basic metabolic panel.  25 minutes spent with patient

## 2013-10-09 NOTE — Patient Instructions (Signed)
Start losartan 50 mg daily and return in 6 weeks for office visit, blood pressure check and basic metabolic panel. Continue to monitor blood pressure at home.

## 2013-10-30 ENCOUNTER — Ambulatory Visit
Admission: RE | Admit: 2013-10-30 | Discharge: 2013-10-30 | Disposition: A | Discharge status: Home / Self Care | Payer: 59 | Source: Ambulatory Visit | Attending: Internal Medicine | Admitting: Internal Medicine

## 2013-10-30 DIAGNOSIS — Z0000 Encounter for general adult medical examination without abnormal findings: Secondary | ICD-10-CM

## 2013-11-20 ENCOUNTER — Ambulatory Visit (INDEPENDENT_AMBULATORY_CARE_PROVIDER_SITE_OTHER): Payer: 59 | Admitting: Internal Medicine

## 2013-11-20 ENCOUNTER — Encounter: Payer: Self-pay | Admitting: Internal Medicine

## 2013-11-20 VITALS — BP 128/76 | HR 80 | Temp 98.5°F | Wt 158.0 lb

## 2013-11-20 DIAGNOSIS — I639 Cerebral infarction, unspecified: Secondary | ICD-10-CM

## 2013-11-20 DIAGNOSIS — I6381 Other cerebral infarction due to occlusion or stenosis of small artery: Secondary | ICD-10-CM

## 2013-11-20 DIAGNOSIS — G458 Other transient cerebral ischemic attacks and related syndromes: Secondary | ICD-10-CM

## 2013-11-20 DIAGNOSIS — I6350 Cerebral infarction due to unspecified occlusion or stenosis of unspecified cerebral artery: Secondary | ICD-10-CM

## 2013-11-20 DIAGNOSIS — I10 Essential (primary) hypertension: Secondary | ICD-10-CM

## 2013-11-20 LAB — BASIC METABOLIC PANEL
BUN: 14 mg/dL (ref 6–23)
CO2: 29 mEq/L (ref 19–32)
Calcium: 9.7 mg/dL (ref 8.4–10.5)
Chloride: 103 mEq/L (ref 96–112)
Creat: 0.77 mg/dL (ref 0.50–1.10)
Glucose, Bld: 88 mg/dL (ref 70–99)
Potassium: 5.1 mEq/L (ref 3.5–5.3)
SODIUM: 140 meq/L (ref 135–145)

## 2013-11-20 MED ORDER — HYDROCODONE-ACETAMINOPHEN 5-325 MG PO TABS: 1.0000 | ORAL_TABLET | Freq: Four times a day (QID) | ORAL | Status: DC | PRN

## 2013-11-20 NOTE — Patient Instructions (Addendum)
Norco 5/325 ( #90)  refilled today. BP acceptable on Losartan 50 mg daily. Return Jan for CPE. Keep appt for neurology in August.

## 2013-11-20 NOTE — Progress Notes (Signed)
   Subjective:    Patient ID: Tracie Roach, female    DOB: [birth year], 58 y.o.   MRN: [id number]  HPI patient with history of thalamic infarct with some left arm residual weakness and at times tightness with overexertion in today for followup of hypertension. She's now on losartan 50 mg daily. She brings in multiple low pressure readings at various to spare day between left and right arms. Left arm tends to run a bit low or 121-135 systolically. Right arm tends to run a bit higher 139-150 systolically. Diastolics are under good control in both arms. 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 In any case her blood pressure in the left arm is very except for the day. She feels well for the most part. Continues to have migraine headaches. Sometimes takes Norco 5/325 for left arm pain when she overdoes housecleaning or gardening. She has appointment in August to see neurologist. He is to have carotid Dopplers done at that time. Basic metabolic panel drawn today a losartan. She is scheduled to have six-month fasting lipid panel liver functions in July and we will proceed with that without office visit. Her physical examination is due January 2016 and we will schedule that today.    Review of Systems     Objective:   Physical Exam  Skin warm and dry. Neck supple without JVD thyromegaly or carotid bruits. Chest clear. Cardiac exam regular rate and rhythm normal S1 and S2. Extremities without edema.      Assessment & Plan:  History of thalamic infarct  Hypertension  Hyperlipidemia  Anxiety  Migraine headaches  Plan: See above plan regarding just getting lipid panel liver functions in July and scheduling complete physical examination January 2016. I have refilled Norco 5/325 #90 tablets today by written prescription. Keep appointment with neurologist in August.

## 2013-11-28 ENCOUNTER — Other Ambulatory Visit: Payer: Self-pay

## 2013-11-28 ENCOUNTER — Telehealth: Payer: Self-pay | Admitting: Internal Medicine

## 2013-11-28 MED ORDER — CYCLOBENZAPRINE HCL 10 MG PO TABS: 10.0000 mg | ORAL_TABLET | Freq: Every day | ORAL | Status: DC

## 2013-11-28 NOTE — Telephone Encounter (Signed)
Flexeril 10 mg #30  One half tablet hs

## 2013-11-28 NOTE — Telephone Encounter (Signed)
Patient informed. 

## 2013-12-25 ENCOUNTER — Other Ambulatory Visit: Payer: Self-pay | Admitting: Internal Medicine

## 2013-12-29 ENCOUNTER — Other Ambulatory Visit: Payer: Self-pay | Admitting: Internal Medicine

## 2014-01-06 ENCOUNTER — Other Ambulatory Visit: Payer: 59 | Admitting: Internal Medicine

## 2014-01-06 DIAGNOSIS — Z79899 Other long term (current) drug therapy: Secondary | ICD-10-CM

## 2014-01-06 DIAGNOSIS — E785 Hyperlipidemia, unspecified: Secondary | ICD-10-CM

## 2014-01-06 DIAGNOSIS — R7301 Impaired fasting glucose: Secondary | ICD-10-CM

## 2014-01-06 LAB — LIPID PANEL
CHOL/HDL RATIO: 3.1 ratio
Cholesterol: 156 mg/dL (ref 0–200)
HDL: 50 mg/dL (ref >39)
LDL CALC: 91 mg/dL (ref 0–99)
TRIGLYCERIDES: 76 mg/dL (ref <150)
VLDL: 15 mg/dL (ref 0–40)

## 2014-01-06 LAB — HEPATIC FUNCTION PANEL
ALBUMIN: 4 g/dL (ref 3.5–5.2)
ALK PHOS: 107 U/L (ref 39–117)
ALT: 15 U/L (ref 0–35)
AST: 20 U/L (ref 0–37)
BILIRUBIN TOTAL: 0.4 mg/dL (ref 0.2–1.2)
Bilirubin, Direct: 0.1 mg/dL (ref 0.0–0.3)
Indirect Bilirubin: 0.3 mg/dL (ref 0.2–1.2)
TOTAL PROTEIN: 6.8 g/dL (ref 6.0–8.3)

## 2014-01-06 LAB — HEMOGLOBIN A1C
HEMOGLOBIN A1C: 5.6 % (ref <5.7)
Mean Plasma Glucose: 114 mg/dL (ref <117)

## 2014-01-07 NOTE — Progress Notes (Signed)
Patient informed. Copy mailed to her as well.

## 2014-01-09 ENCOUNTER — Ambulatory Visit: Payer: PRIVATE HEALTH INSURANCE | Admitting: Internal Medicine

## 2014-01-26 ENCOUNTER — Other Ambulatory Visit: Payer: Self-pay | Admitting: Internal Medicine

## 2014-01-27 ENCOUNTER — Other Ambulatory Visit: Payer: Self-pay | Admitting: Internal Medicine

## 2014-01-27 NOTE — Telephone Encounter (Signed)
Make sure this was refilled yesterday for 6 months

## 2014-02-11 ENCOUNTER — Encounter: Payer: Self-pay | Admitting: Nurse Practitioner

## 2014-02-11 ENCOUNTER — Ambulatory Visit (INDEPENDENT_AMBULATORY_CARE_PROVIDER_SITE_OTHER): Payer: 59 | Admitting: Nurse Practitioner

## 2014-02-11 VITALS — BP 117/66 | HR 85 | Ht 66.0 in | Wt 161.4 lb

## 2014-02-11 DIAGNOSIS — Z8669 Personal history of other diseases of the nervous system and sense organs: Secondary | ICD-10-CM

## 2014-02-11 DIAGNOSIS — R209 Unspecified disturbances of skin sensation: Secondary | ICD-10-CM

## 2014-02-11 DIAGNOSIS — I6350 Cerebral infarction due to unspecified occlusion or stenosis of unspecified cerebral artery: Secondary | ICD-10-CM

## 2014-02-11 NOTE — Progress Notes (Signed)
[facility]  PATIENT: JABREA KALLSTROM DOB: 1955-08-19   REASON FOR VISIT: Followup for history of right thalamic infarct   HISTORY OF PRESENT ILLNESS:Ms. Honda, 58 year old female returns for followup. 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 She has not had further stroke or TIA symptoms. She is currently on aspirin 325 daily. She continues to have paresthesias of the left hand and arm she has had these since her stroke. They're not disabling just annoying. They occur more frequently when she is fatigued. Her recent labs with good lipid profile. She needs repeat Doppler study. Her headaches are in good control .She returns for reevaluation   HISTORY: small right thalamic infarct in January 2014. Vascular risk factor of hyperlipidemia only. She also has history of chronic migraine headaches.  She was last seen by Dr. Pearlean Brownie 09/25/12 for first office followup visit for hospital admission form stroke on 06/28/12. She presented with left hand and feet paresthesias. She was not a candidate for TPA due to the symptoms going on for 3-4 days and waxing and waning. CT scan of head was unremarkable but MRI scan showed a small right thalamic infarct. MRA of the brain and carotid Dopplers did not show large vessel stenosis. Transthoracic echo showed normal ejection fraction. She was started on aspirin for stroke prevention and a statin for her hyperlipidemia. She was restarted on amitriptyline for her chronic migraines. She states her migraines are somewhat improved but still occur about 4 times a month. Her primary physician had recently increased her amitriptyline to 20 mg at night. She continues to take Fioricet as needed which provides good relief. She does admit to significant stress due to the health of her mother who is now living with her and she is the caregiver. She has cut back on caffeine. She still has some paresthesias in the fingertips and left arm. This is  annoying but not bothersome. She does not want specific medications for this at the present time. She is tolerating aspirin well but does have minor bruising. She denies significant myalgias on her statin.   REVIEW OF SYSTEMS: Full 14 system review of systems performed and notable only for those listed, all others are neg:  Constitutional: N/A  Cardiovascular: N/A  Ear/Nose/Throat: N/A  Skin: N/A  Eyes: N/A  Respiratory: N/A  Gastroitestinal: N/A  Hematology/Lymphatic: N/A  Endocrine: N/A Musculoskeletal:N/A  Allergy/Immunology: N/A  Neurological: N/A Psychiatric: N/A Sleep : NA   ALLERGIES: No Known Allergies  HOME MEDICATIONS: Outpatient Prescriptions Prior to Visit  Medication Sig Dispense Refill  . ALPRAZolam (XANAX) 0.25 MG tablet TAKE 1 TABLET BY MOUTH AT BEDTIME AS NEEDED  30 tablet  5  . amitriptyline (ELAVIL) 10 MG tablet TAKE 2 TABLETS AT BEDTIME FOR MIGRAINE PROPHYLAXIS  60 tablet  11  . aspirin EC 325 MG EC tablet Take 1 tablet (325 mg total) by mouth daily.      . B Complex-C (B-COMPLEX WITH VITAMIN C) tablet Take 1 tablet by mouth daily.      . butalbital-acetaminophen-caffeine (FIORICET, ESGIC) 50-325-40 MG per tablet TAKE 1 OR 2 TABLETS BY MOUTH EVERY 12 HOURS AS NEEDED FOR HEADACHES  60 tablet  5  . CALCIUM PO Take by mouth.      . cyclobenzaprine (FLEXERIL) 10 MG tablet TAKE 1/2-1 TABLET AT BEDTIME AS NEEDED  30 tablet  5  . HYDROcodone-acetaminophen (NORCO) 5-325 MG per tablet Take 1 tablet by mouth every 6 (six) hours as needed.  90 tablet  0  . ketoconazole (NIZORAL) 2 % shampoo Apply topically 2 (two) times a week.  120 mL  0  . losartan (COZAAR) 50 MG tablet TAKE 1 TABLET (50 MG TOTAL) BY MOUTH DAILY.  30 tablet  3  . magnesium gluconate (MAGONATE) 500 MG tablet Take 1 tablet (500 mg total) by mouth daily.      . Multiple Vitamins-Minerals (EYE VITAMINS) CAPS Take by mouth.      . Multiple Vitamins-Minerals (MULTIVITAMIN WITH MINERALS) tablet Take 1 tablet  by mouth daily.      . nitrofurantoin, macrocrystal-monohydrate, (MACROBID) 100 MG capsule Take 100 mg by mouth as needed.      . polyethylene glycol (MIRALAX / GLYCOLAX) packet Take 17 g by mouth every other day.      . simvastatin (ZOCOR) 20 MG tablet TAKE 1 TABLET BY MOUTH EVERY DAY  30 tablet  5   No facility-administered medications prior to visit.    PAST MEDICAL HISTORY: Past Medical History  Diagnosis Date  . Headache(784.0)   . Functional constipation   . Hot flashes   . Postmenopausal   . Essential tremor   . High cholesterol   . Stroke     PAST SURGICAL HISTORY: Past Surgical History  Procedure Laterality Date  . Appendectomy      FAMILY HISTORY: Family History  Problem Relation Age of Onset  . Heart disease Mother     SOCIAL HISTORY: History   Social History  . Marital Status: Married    Spouse Name: LUVERTA KORTE    Number of Children: 2  . Years of Education: college   Occupational History  . Not on file.   Social History Main Topics  . Smoking status: Former Smoker -- 0.30 packs/day    Quit date: 08/27/2011  . Smokeless tobacco: Never Used  . Alcohol Use: No  . Drug Use: No  . Sexual Activity: Not on file   Other Topics Concern  . Not on file   Social History Narrative   Patient lives with husband [name].    Patient has 2 children.    Patient is a homemaker.    Patient has 2 years of community college.      PHYSICAL EXAM  Filed Vitals:   02/11/14 1401  BP: 117/66  Pulse: 85  Height:  (1.676 m)  Weight: 161 lb 6.4 oz (73.211 kg)   Body mass index is 26.06 kg/(m^2). Generalized: Well developed, in no acute distress  Head: normocephalic and atraumatic,. Oropharynx benign  Neck: Supple, no carotid bruits  Cardiac: Regular rate rhythm, no murmur  Musculoskeletal: No deformity  Neurological examination  Mentation: Alert oriented to time, place, history taking. Follows all commands speech and language fluent  Cranial nerve  II-XII: Pupils were equal round reactive to light extraocular movements were full, visual field were full on confrontational test. Facial sensation and strength were normal. hearing was intact to finger rubbing bilaterally. Uvula tongue midline. head turning and shoulder shrug were normal and symmetric.Tongue protrusion into cheek strength was normal.  Motor: normal bulk and tone, full strength in the BUE, BLE, fine finger movements normal, no pronator drift. No focal weakness  Sensory: normal and symmetric to light touch, pinprick, and Vibration. Subjective paresthesias in the left hand and arm .  Coordination: finger-nose-finger, heel-to-shin bilaterally, no dysmetria  Reflexes: 1+ upper and lower symmetric  Gait and Station: Rising up from seated position without assistance, normal stance, moderate stride, good arm swing, smooth  turning, able to perform tiptoe, and heel walking without difficulty. Tandem gait is steady  DIAGNOSTIC DATA (LABS, IMAGING, TESTING) - I reviewed patient records, labs, notes, testing and imaging myself where available.  Lab Results  Component Value Date   WBC 6.8 06/30/2013   HGB 14.0 06/30/2013   HCT 41.4 06/30/2013   MCV 89.8 06/30/2013   PLT 350 06/30/2013      Component Value Date/Time   NA 140 11/20/2013 1102   K 5.1 11/20/2013 1102   CL 103 11/20/2013 1102   CO2 29 11/20/2013 1102   GLUCOSE 88 11/20/2013 1102   BUN 14 11/20/2013 1102   CREATININE 0.77 11/20/2013 1102   CREATININE 0.69 06/29/2012 0242   CALCIUM 9.7 11/20/2013 1102   PROT 6.8 01/06/2014 0918   ALBUMIN 4.0 01/06/2014 0918   AST 20 01/06/2014 0918   ALT 15 01/06/2014 0918   ALKPHOS 107 01/06/2014 0918   BILITOT 0.4 01/06/2014 0918   GFRNONAA >90 06/29/2012 0242   GFRAA >90 06/29/2012 0242   Lab Results  Component Value Date   CHOL 156 01/06/2014   HDL 50 01/06/2014   LDLCALC 91 01/06/2014   TRIG 76 01/06/2014   CHOLHDL 3.1 01/06/2014   Lab Results  Component Value Date   HGBA1C 5.6 01/06/2014    Lab  Results  Component Value Date   TSH 2.981 06/30/2013      ASSESSMENT AND PLAN  58 y.o. year old female  has a past medical history of Headache(784.0);  Essential tremor; High cholesterol; and  right thalmic infarct in January 2014 secondary to small vessel disease with vascular risk factors of hyperlipidemia.She is a patient of Dr. Pearlean Brownie who is out of the office  Continue ASA for secondary stroke prevention Reviewed lipid profile from July all within normal limits Will set up for carotid Doppler Continue amitriptyline for migraine prophylaxis Followup in 6 months Nilda Riggs, [facility], [facility], APRN  [facility] [address] [place], [place] [address] [phone]

## 2014-02-11 NOTE — Patient Instructions (Signed)
Continue ASA for secondary stroke prevention Reviewed lipid profile from July all within normal limits Will set up for carotid Doppler Continue amitriptyline for migraine prophylaxis Followup in 6 months

## 2014-02-12 NOTE — Progress Notes (Signed)
I have read the note, and I agree with the clinical assessment and plan.  WILLIS,CHARLES KEITH   

## 2014-02-19 ENCOUNTER — Ambulatory Visit (INDEPENDENT_AMBULATORY_CARE_PROVIDER_SITE_OTHER): Payer: 59

## 2014-02-19 DIAGNOSIS — I6350 Cerebral infarction due to unspecified occlusion or stenosis of unspecified cerebral artery: Secondary | ICD-10-CM

## 2014-03-06 ENCOUNTER — Telehealth: Payer: Self-pay | Admitting: Nurse Practitioner

## 2014-03-06 NOTE — Telephone Encounter (Signed)
Carotid doppler results called to patient. 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 She verbalizes understanding.

## 2014-03-09 ENCOUNTER — Telehealth: Payer: Self-pay | Admitting: Internal Medicine

## 2014-03-09 NOTE — Telephone Encounter (Signed)
We do not recommend shingles vaccine until age 57 and pneumonia vaccine until age 40 regardless of what TV says

## 2014-03-09 NOTE — Telephone Encounter (Signed)
Informed patient shingles and pneumonia vaccine is not recommended at this time.

## 2014-03-09 NOTE — Telephone Encounter (Signed)
Pt would like to get prescription for Shingles and pneumonia injections, can they be taken together? Or which one should be taken first if not together?  Best number to call pt is [phone].  Pharmacy of choice is [facility] [address]

## 2014-04-14 ENCOUNTER — Encounter: Payer: Self-pay | Admitting: Internal Medicine

## 2014-05-12 ENCOUNTER — Encounter: Payer: Self-pay | Admitting: Neurology

## 2014-05-23 ENCOUNTER — Other Ambulatory Visit: Payer: Self-pay | Admitting: Internal Medicine

## 2014-05-26 ENCOUNTER — Other Ambulatory Visit: Payer: Self-pay | Admitting: Internal Medicine

## 2014-07-07 ENCOUNTER — Other Ambulatory Visit: Payer: 59 | Admitting: Internal Medicine

## 2014-07-09 ENCOUNTER — Encounter: Payer: 59 | Admitting: Internal Medicine

## 2014-07-18 ENCOUNTER — Other Ambulatory Visit: Payer: Self-pay | Admitting: Internal Medicine

## 2014-08-10 ENCOUNTER — Other Ambulatory Visit: Payer: 59 | Admitting: Internal Medicine

## 2014-08-10 DIAGNOSIS — Z1329 Encounter for screening for other suspected endocrine disorder: Secondary | ICD-10-CM

## 2014-08-10 DIAGNOSIS — Z130 Encounter for screening for diseases of the blood and blood-forming organs and certain disorders involving the immune mechanism: Secondary | ICD-10-CM

## 2014-08-10 DIAGNOSIS — Z0000 Encounter for general adult medical examination without abnormal findings: Secondary | ICD-10-CM

## 2014-08-10 DIAGNOSIS — Z13220 Encounter for screening for lipoid disorders: Secondary | ICD-10-CM

## 2014-08-10 DIAGNOSIS — Z1321 Encounter for screening for nutritional disorder: Secondary | ICD-10-CM

## 2014-08-10 LAB — COMPREHENSIVE METABOLIC PANEL
ALT: 16 U/L (ref 0–35)
AST: 18 U/L (ref 0–37)
Albumin: 4.1 g/dL (ref 3.5–5.2)
Alkaline Phosphatase: 117 U/L (ref 39–117)
BUN: 17 mg/dL (ref 6–23)
CO2: 31 mEq/L (ref 19–32)
Calcium: 10 mg/dL (ref 8.4–10.5)
Chloride: 103 mEq/L (ref 96–112)
Creat: 0.78 mg/dL (ref 0.50–1.10)
GLUCOSE: 100 mg/dL — AB (ref 70–99)
Potassium: 4.4 mEq/L (ref 3.5–5.3)
SODIUM: 142 meq/L (ref 135–145)
Total Bilirubin: 0.3 mg/dL (ref 0.2–1.2)
Total Protein: 6.9 g/dL (ref 6.0–8.3)

## 2014-08-10 LAB — LIPID PANEL
CHOL/HDL RATIO: 3 ratio
Cholesterol: 156 mg/dL (ref 0–200)
HDL: 52 mg/dL (ref >=46)
LDL Cholesterol: 88 mg/dL (ref 0–99)
Triglycerides: 81 mg/dL (ref <150)
VLDL: 16 mg/dL (ref 0–40)

## 2014-08-10 LAB — CBC WITH DIFFERENTIAL/PLATELET
BASOS ABS: 0.1 10*3/uL (ref 0.0–0.1)
Basophils Relative: 1 % (ref 0–1)
Eosinophils Absolute: 0.3 10*3/uL (ref 0.0–0.7)
Eosinophils Relative: 5 % (ref 0–5)
HCT: 41.8 % (ref 36.0–46.0)
HEMOGLOBIN: 13.8 g/dL (ref 12.0–15.0)
Lymphocytes Relative: 36 % (ref 12–46)
Lymphs Abs: 2.2 10*3/uL (ref 0.7–4.0)
MCH: 30.2 pg (ref 26.0–34.0)
MCHC: 33 g/dL (ref 30.0–36.0)
MCV: 91.5 fL (ref 78.0–100.0)
MPV: 10.5 fL (ref 8.6–12.4)
Monocytes Absolute: 0.4 10*3/uL (ref 0.1–1.0)
Monocytes Relative: 6 % (ref 3–12)
Neutro Abs: 3.2 10*3/uL (ref 1.7–7.7)
Neutrophils Relative %: 52 % (ref 43–77)
Platelets: 341 10*3/uL (ref 150–400)
RBC: 4.57 MIL/uL (ref 3.87–5.11)
RDW: 12.8 % (ref 11.5–15.5)
WBC: 6.2 10*3/uL (ref 4.0–10.5)

## 2014-08-10 LAB — TSH: TSH: 1.857 u[IU]/mL (ref 0.350–4.500)

## 2014-08-11 ENCOUNTER — Encounter: Payer: Self-pay | Admitting: Internal Medicine

## 2014-08-11 ENCOUNTER — Ambulatory Visit (INDEPENDENT_AMBULATORY_CARE_PROVIDER_SITE_OTHER): Payer: No Typology Code available for payment source | Admitting: Internal Medicine

## 2014-08-11 VITALS — BP 116/72 | HR 102 | Temp 98.2°F | Wt 165.0 lb

## 2014-08-11 DIAGNOSIS — I6322 Cerebral infarction due to unspecified occlusion or stenosis of basilar arteries: Secondary | ICD-10-CM

## 2014-08-11 DIAGNOSIS — I63211 Cerebral infarction due to unspecified occlusion or stenosis of right vertebral arteries: Secondary | ICD-10-CM

## 2014-08-11 DIAGNOSIS — I10 Essential (primary) hypertension: Secondary | ICD-10-CM

## 2014-08-11 DIAGNOSIS — Z0000 Encounter for general adult medical examination without abnormal findings: Secondary | ICD-10-CM | POA: Diagnosis not present

## 2014-08-11 DIAGNOSIS — Z8669 Personal history of other diseases of the nervous system and sense organs: Secondary | ICD-10-CM | POA: Diagnosis not present

## 2014-08-11 DIAGNOSIS — Z8659 Personal history of other mental and behavioral disorders: Secondary | ICD-10-CM | POA: Diagnosis not present

## 2014-08-11 DIAGNOSIS — E785 Hyperlipidemia, unspecified: Secondary | ICD-10-CM | POA: Diagnosis not present

## 2014-08-11 DIAGNOSIS — I6381 Other cerebral infarction due to occlusion or stenosis of small artery: Secondary | ICD-10-CM

## 2014-08-11 LAB — POCT URINALYSIS DIPSTICK
Bilirubin, UA: NEGATIVE
Blood, UA: NEGATIVE
Glucose, UA: NEGATIVE
KETONES UA: NEGATIVE
LEUKOCYTES UA: NEGATIVE
Nitrite, UA: NEGATIVE
PROTEIN UA: NEGATIVE
SPEC GRAV UA: 1.01
UROBILINOGEN UA: NEGATIVE
pH, UA: 6.5

## 2014-08-11 LAB — VITAMIN D 25 HYDROXY (VIT D DEFICIENCY, FRACTURES): VIT D 25 HYDROXY: 53 ng/mL (ref 30–100)

## 2014-08-11 MED ORDER — BUTALBITAL-APAP-CAFFEINE 50-325-40 MG PO TABS: ORAL_TABLET | ORAL | Status: DC

## 2014-08-11 MED ORDER — AMITRIPTYLINE HCL 10 MG PO TABS: ORAL_TABLET | ORAL | Status: DC

## 2014-08-11 MED ORDER — ALPRAZOLAM 0.25 MG PO TABS: 0.2500 mg | ORAL_TABLET | Freq: Every evening | ORAL | Status: DC | PRN

## 2014-08-11 MED ORDER — HYDROCODONE-ACETAMINOPHEN 5-325 MG PO TABS: 1.0000 | ORAL_TABLET | Freq: Four times a day (QID) | ORAL | Status: DC | PRN

## 2014-08-11 NOTE — Progress Notes (Signed)
   Subjective:    Patient ID: Tracie MagnusDonna R Mcminn, female    DOB: 1955/11/23, 59 y.o.   MRN: [id number]  HPI  59 year old White Female for health maintenance and evaluation of medical issues. History of acute lacunar infarct right thalamus in 2014 with left upper extremity numbness and tightness which has persisted. She's followed by a neurologist. History of migraine headaches, essential tremor, constipation, hypertension, hyperlipidemia.  No known drug allergies  Past medical history: Occasional urinary tract infections. Appendectomy April 2011.  Family history: Father died in 2009 with pulmonary fibrosis. Mother with history of CABG and valve replacement. One sister in good health. No brothers.  Social history: Married with 2 adult children. Does not smoke or consume alcohol. Quit smoking in March 2013 and used to smoke 6 or 7 cigarettes daily.    Review of Systems  Constitutional: Positive for fatigue.  HENT: Negative.   Respiratory: Negative.   Cardiovascular: Negative.   Gastrointestinal: Negative.   Genitourinary: Negative.   Neurological:       Paresthesias still persist with left arm History of migraine headaches  Hematological: Negative.   Psychiatric/Behavioral:       History of anxiety       Objective:   Physical Exam  Constitutional: She is oriented to person, place, and time. She appears well-developed and well-nourished. No distress.  HENT:  Head: Normocephalic and atraumatic.  Right Ear: External ear normal.  Left Ear: External ear normal.  Mouth/Throat: Oropharynx is clear and moist. No oropharyngeal exudate.  Eyes: Conjunctivae and EOM are normal. Pupils are equal, round, and reactive to light. Right eye exhibits no discharge. Left eye exhibits no discharge. No scleral icterus.  Neck: Neck supple. No JVD present. No thyromegaly present.  No bruits  Cardiovascular: Normal rate, regular rhythm, normal heart sounds and intact distal pulses.   No murmur  heard. Pulmonary/Chest: Effort normal and breath sounds normal. No respiratory distress. She has no wheezes. She has no rales. She exhibits no tenderness.  Breasts normal female  Abdominal: Soft. Bowel sounds are normal. She exhibits no distension and no mass. There is no tenderness. There is no rebound and no guarding.  Genitourinary:  Bimanual normal. Pap taken last year  Musculoskeletal: She exhibits no edema.  Neurological: She is alert and oriented to person, place, and time. She has normal reflexes. No cranial nerve deficit. Coordination normal.  Skin: Skin is warm and dry. No rash noted. She is not diaphoretic.  Psychiatric: She has a normal mood and affect. Her behavior is normal. Judgment and thought content normal.  Vitals reviewed.         Assessment & Plan:  History of acute right thalamic lacunar infarct 2014  Hypertension-stable  Hyperlipidemia-treated with statin medication  History of migraine headaches-on Elavil for prophylaxis  History of anxiety-Xanax as been prescribed  Plan: Return in 6 months and continue same medications. She is likely now to have permanent paresthesias in the left upper extremity since stroke was in 2014. Migraine headaches are manageable with Elavil prophylaxis and pain medication for acute onset. She has Xanax for anxiety. She has Flexeril for musculoskeletal pain as well as narcotic pain medication for migraine headaches. Needs to have annual mammogram. Her insurance requires referrals to specialists.

## 2014-08-11 NOTE — Patient Instructions (Addendum)
Return in 6 months for lipid liver panels and OV. Try to exercise.

## 2014-08-17 ENCOUNTER — Ambulatory Visit: Payer: 59 | Admitting: Nurse Practitioner

## 2014-08-18 ENCOUNTER — Encounter: Payer: Self-pay | Admitting: Nurse Practitioner

## 2014-08-18 ENCOUNTER — Ambulatory Visit (INDEPENDENT_AMBULATORY_CARE_PROVIDER_SITE_OTHER): Payer: No Typology Code available for payment source | Admitting: Nurse Practitioner

## 2014-08-18 VITALS — BP 122/70 | HR 95 | Ht 66.0 in | Wt 166.0 lb

## 2014-08-18 DIAGNOSIS — R209 Unspecified disturbances of skin sensation: Secondary | ICD-10-CM | POA: Diagnosis not present

## 2014-08-18 DIAGNOSIS — I6381 Other cerebral infarction due to occlusion or stenosis of small artery: Secondary | ICD-10-CM

## 2014-08-18 DIAGNOSIS — I639 Cerebral infarction, unspecified: Secondary | ICD-10-CM

## 2014-08-18 DIAGNOSIS — Z8669 Personal history of other diseases of the nervous system and sense organs: Secondary | ICD-10-CM | POA: Diagnosis not present

## 2014-08-18 NOTE — Progress Notes (Signed)
[facility]  PATIENT: Tracie Roach DOB: August 30, 1955   REASON FOR VISIT: Follow-up for history of stroke, headache, paresthesias  HISTORY FROM: Patient    HISTORY OF PRESENT ILLNESS:Tracie Roach, 59 year old female returns for followup. She was last seen in this office 02/11/2014. 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 She has not had further stroke or TIA symptoms. She is currently on aspirin 325 daily. She continues to have paresthesias of the left hand and arm she has had these since her stroke. They're not disabling just annoying. They occur more frequently when she is fatigued. Her recent labs with good lipid profile. Last carotid Doppler in August 2015 with 50-69% stenosis. She will need a repeat in 6 months Her headaches are in good control .She gets no regular exercise She returns for reevaluation   HISTORY: small right thalamic infarct in January 2014. Vascular risk factor of hyperlipidemia only. She also has history of chronic migraine headaches.  She was last seen by Dr. Pearlean Brownie 09/25/12 for first office followup visit for hospital admission form stroke on 06/28/12. She presented with left hand and feet paresthesias. She was not a candidate for TPA due to the symptoms going on for 3-4 days and waxing and waning. CT scan of head was unremarkable but MRI scan showed a small right thalamic infarct. MRA of the brain and carotid Dopplers did not show large vessel stenosis. Transthoracic echo showed normal ejection fraction. She was started on aspirin for stroke prevention and a statin for her hyperlipidemia. She was restarted on amitriptyline for her chronic migraines. She states her migraines are somewhat improved but still occur about 4 times a month. Her primary physician had recently increased her amitriptyline to 20 mg at night. She continues to take Fioricet as needed which provides good relief. She does admit to significant stress due to the  health of her mother who is now living with her and she is the caregiver. She has cut back on caffeine. She still has some paresthesias in the fingertips and left arm. This is annoying but not bothersome. She does not want specific medications for this at the present time. She is tolerating aspirin well but does have minor bruising. She denies significant myalgias on her statin.    REVIEW OF SYSTEMS: Full 14 system review of systems performed and notable only for those listed, all others are neg:  Constitutional: neg  Cardiovascular: neg Ear/Nose/Throat: neg  Skin: neg Eyes: neg Respiratory: neg Gastroitestinal: neg  Hematology/Lymphatic: neg  Endocrine: neg Musculoskeletal: Joint pain , aching muscles Allergy/Immunology: neg Neurological: Numbness paresthesias residual from stroke  Psychiatric: neg Sleep : neg   ALLERGIES: No Known Allergies  HOME MEDICATIONS: Outpatient Prescriptions Prior to Visit  Medication Sig Dispense Refill  . ALPRAZolam (XANAX) 0.25 MG tablet Take 1 tablet (0.25 mg total) by mouth at bedtime as needed. 34 tablet 5  . amitriptyline (ELAVIL) 10 MG tablet TAKE 2 TABLETS AT BEDTIME FOR MIGRAINE PROPHYLAXIS 68 tablet 11  . aspirin EC 325 MG EC tablet Take 1 tablet (325 mg total) by mouth daily.    . B Complex-C (B-COMPLEX WITH VITAMIN C) tablet Take 1 tablet by mouth daily.    . butalbital-acetaminophen-caffeine (FIORICET, ESGIC) 50-325-40 MG per tablet TAKE 1 OR 2 TABLETS BY MOUTH EVERY 12 HOURS AS NEEDED FOR HEADACHES 60 tablet 5  . CALCIUM PO Take by mouth.    . cyclobenzaprine (FLEXERIL) 10 MG tablet TAKE 1/2-1 TABLET AT BEDTIME  AS NEEDED 30 tablet 5  . HYDROcodone-acetaminophen (NORCO) 5-325 MG per tablet Take 1 tablet by mouth every 6 (six) hours as needed. 90 tablet 0  . ketoconazole (NIZORAL) 2 % shampoo Apply topically 2 (two) times a week. 120 mL 0  . losartan (COZAAR) 50 MG tablet TAKE 1 TABLET (50 MG TOTAL) BY MOUTH DAILY. 30 tablet 11  . Multiple  Vitamins-Minerals (EYE VITAMINS) CAPS Take by mouth.    . Multiple Vitamins-Minerals (MULTIVITAMIN WITH MINERALS) tablet Take 1 tablet by mouth daily.    . nitrofurantoin, macrocrystal-monohydrate, (MACROBID) 100 MG capsule Take 100 mg by mouth as needed.    . polyethylene glycol (MIRALAX / GLYCOLAX) packet Take 17 g by mouth every other day.    . simvastatin (ZOCOR) 20 MG tablet TAKE 1 TABLET BY MOUTH EVERY DAY 30 tablet 11  . magnesium gluconate (MAGONATE) 500 MG tablet Take 1 tablet (500 mg total) by mouth daily. (Patient not taking: Reported on 08/18/2014)     No facility-administered medications prior to visit.    PAST MEDICAL HISTORY: Past Medical History  Diagnosis Date  . Headache(784.0)   . Functional constipation   . Hot flashes   . Postmenopausal   . Essential tremor   . High cholesterol   . Stroke     PAST SURGICAL HISTORY: Past Surgical History  Procedure Laterality Date  . Appendectomy      FAMILY HISTORY: Family History  Problem Relation Age of Onset  . Heart disease Mother     SOCIAL HISTORY: History   Social History  . Marital Status: Married    Spouse Name: Tracie Roach  . Number of Children: 2  . Years of Education: college   Occupational History  . Not on file.   Social History Main Topics  . Smoking status: Former Smoker -- 0.30 packs/day    Quit date: 08/27/2011  . Smokeless tobacco: Never Used  . Alcohol Use: No  . Drug Use: No  . Sexual Activity: Not on file   Other Topics Concern  . Not on file   Social History Narrative   Patient lives with husband Tracie Roach.    Patient has 2 children.    Patient is a homemaker.    Patient has 2 years of community college.      PHYSICAL EXAM  Filed Vitals:   08/18/14 0922  BP: 122/70  Pulse: 95  Height: 5\' 6"  (1.676 m)  Weight: 166 lb (75.297 kg)   Body mass index is 26.81 kg/(m^2). Generalized: Well developed, in no acute distress  Head: normocephalic and atraumatic,. Oropharynx benign   Neck: Supple, no carotid bruits  Cardiac: Regular rate rhythm, no murmur  Musculoskeletal: No deformity  Neurological examination  Mentation: Alert oriented to time, place, history taking. Follows all commands speech and language fluent  Cranial nerve II-XII: Pupils were equal round reactive to light extraocular movements were full, visual field were full on confrontational test. Facial sensation and strength were normal. hearing was intact to finger rubbing bilaterally. Uvula tongue midline. head turning and shoulder shrug were normal and symmetric.Tongue protrusion into cheek strength was normal.  Motor: normal bulk and tone, full strength in the BUE, BLE, fine finger movements normal, no pronator drift. No focal weakness  Sensory: normal and symmetric to light touch, pinprick, and Vibration. Subjective paresthesias in the left hand and arm .  Coordination: finger-nose-finger, heel-to-shin bilaterally, no dysmetria  Reflexes: 1+ upper and lower symmetric  Gait and Station: Rising up from seated position  without assistance, normal stance, moderate stride, good arm swing, smooth turning, able to perform tiptoe, and heel walking without difficulty. Tandem gait is steady DIAGNOSTIC DATA (LABS, IMAGING, TESTING) - I reviewed patient records, labs, notes, testing and imaging myself where available.  Lab Results  Component Value Date   WBC 6.2 08/10/2014   HGB 13.8 08/10/2014   HCT 41.8 08/10/2014   MCV 91.5 08/10/2014   PLT 341 08/10/2014      Component Value Date/Time   NA 142 08/10/2014 0921   K 4.4 08/10/2014 0921   CL 103 08/10/2014 0921   CO2 31 08/10/2014 0921   GLUCOSE 100* 08/10/2014 0921   BUN 17 08/10/2014 0921   CREATININE 0.78 08/10/2014 0921   CREATININE 0.69 06/29/2012 0242   CALCIUM 10.0 08/10/2014 0921   PROT 6.9 08/10/2014 0921   ALBUMIN 4.1 08/10/2014 0921   AST 18 08/10/2014 0921   ALT 16 08/10/2014 0921   ALKPHOS 117 08/10/2014 0921   BILITOT 0.3  08/10/2014 0921   GFRNONAA >90 06/29/2012 0242   GFRAA >90 06/29/2012 0242   Lab Results  Component Value Date   CHOL 156 08/10/2014   HDL 52 08/10/2014   LDLCALC 88 08/10/2014   TRIG 81 08/10/2014   CHOLHDL 3.0 08/10/2014    Lab Results  Component Value Date   TSH 1.857 08/10/2014      ASSESSMENT AND PLAN  59 y.o. year old female  has a past medical history of Headache(784.0);  High cholesterol; and Stroke with residual paresthesias.   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  Continue aspirin for secondary stroke prevention Continue amitriptyline for migraine prevention, does not need refills Call for any strokelike symptoms or increase in headaches Follow-up yearly Nilda Riggs, [facility], [facility], APRN  [facility] [address] [place], [place] [address] [phone]

## 2014-08-18 NOTE — Patient Instructions (Signed)
Carotid Doppler in August 2015 consistent with 50-69% stenosis of the left mid/distal ICAs, will repeat in 6 months Reviewed lipid profile and CBC CMP and thyroid and recently Continue amitriptyline for migraine prevention Call for any strokelike symptoms or increase in headaches Follow-up yearly

## 2014-08-19 NOTE — Progress Notes (Signed)
I agree with the above plan 

## 2014-09-30 ENCOUNTER — Encounter: Payer: Self-pay | Admitting: *Deleted

## 2014-09-30 ENCOUNTER — Telehealth: Payer: Self-pay | Admitting: *Deleted

## 2014-09-30 DIAGNOSIS — Z1231 Encounter for screening mammogram for malignant neoplasm of breast: Secondary | ICD-10-CM

## 2014-09-30 NOTE — Telephone Encounter (Signed)
Patient to call and schedule appt

## 2014-11-24 ENCOUNTER — Ambulatory Visit
Admission: RE | Admit: 2014-11-24 | Discharge: 2014-11-24 | Disposition: A | Discharge status: Home / Self Care | Payer: PRIVATE HEALTH INSURANCE | Source: Ambulatory Visit | Attending: Internal Medicine | Admitting: Internal Medicine

## 2014-11-24 DIAGNOSIS — Z1231 Encounter for screening mammogram for malignant neoplasm of breast: Secondary | ICD-10-CM

## 2015-01-13 ENCOUNTER — Telehealth: Payer: Self-pay | Admitting: Nurse Practitioner

## 2015-01-13 DIAGNOSIS — I6350 Cerebral infarction due to unspecified occlusion or stenosis of unspecified cerebral artery: Secondary | ICD-10-CM

## 2015-01-13 DIAGNOSIS — I639 Cerebral infarction, unspecified: Secondary | ICD-10-CM

## 2015-01-13 DIAGNOSIS — I6381 Other cerebral infarction due to occlusion or stenosis of small artery: Secondary | ICD-10-CM

## 2015-01-13 NOTE — Telephone Encounter (Signed)
TC to patient to remind her it is time to schedule carotid doppler. Will place order.She is agreeable.

## 2015-01-27 ENCOUNTER — Ambulatory Visit (INDEPENDENT_AMBULATORY_CARE_PROVIDER_SITE_OTHER): Payer: No Typology Code available for payment source

## 2015-01-27 DIAGNOSIS — I6381 Other cerebral infarction due to occlusion or stenosis of small artery: Secondary | ICD-10-CM

## 2015-01-27 DIAGNOSIS — I6350 Cerebral infarction due to unspecified occlusion or stenosis of unspecified cerebral artery: Secondary | ICD-10-CM

## 2015-01-27 DIAGNOSIS — I639 Cerebral infarction, unspecified: Secondary | ICD-10-CM | POA: Diagnosis not present

## 2015-01-29 ENCOUNTER — Other Ambulatory Visit: Payer: Self-pay | Admitting: *Deleted

## 2015-01-29 MED ORDER — HYDROCODONE-ACETAMINOPHEN 5-325 MG PO TABS: 1.0000 | ORAL_TABLET | Freq: Four times a day (QID) | ORAL | Status: DC | PRN

## 2015-01-29 NOTE — Telephone Encounter (Signed)
Hydrocodone refilled.  

## 2015-02-04 ENCOUNTER — Telehealth: Payer: Self-pay | Admitting: Nurse Practitioner

## 2015-02-04 NOTE — Telephone Encounter (Signed)
I called and spoke to pt and let her know the results of carotid doppler. I made yearly appt for her on 08-23-2015 at 1030.  She may have insurance change and will call if needs to reschedule appt.

## 2015-02-04 NOTE — Telephone Encounter (Signed)
Please let patient know there has been no change in carotid doppler results done 01/27/15 from 03/01/14.

## 2015-02-11 ENCOUNTER — Other Ambulatory Visit: Payer: No Typology Code available for payment source | Admitting: Internal Medicine

## 2015-02-11 ENCOUNTER — Other Ambulatory Visit: Payer: Self-pay | Admitting: Internal Medicine

## 2015-02-11 DIAGNOSIS — E785 Hyperlipidemia, unspecified: Secondary | ICD-10-CM

## 2015-02-11 DIAGNOSIS — Z79899 Other long term (current) drug therapy: Secondary | ICD-10-CM

## 2015-02-11 LAB — HEPATIC FUNCTION PANEL
ALT: 14 U/L (ref 6–29)
AST: 18 U/L (ref 10–35)
Albumin: 4.1 g/dL (ref 3.6–5.1)
Alkaline Phosphatase: 104 U/L (ref 33–130)
BILIRUBIN DIRECT: 0.1 mg/dL (ref <=0.2)
BILIRUBIN TOTAL: 0.3 mg/dL (ref 0.2–1.2)
Indirect Bilirubin: 0.2 mg/dL (ref 0.2–1.2)
Total Protein: 6.6 g/dL (ref 6.1–8.1)

## 2015-02-11 LAB — LIPID PANEL
CHOLESTEROL: 149 mg/dL (ref 125–200)
HDL: 48 mg/dL (ref >=46)
LDL Cholesterol: 81 mg/dL (ref <130)
Total CHOL/HDL Ratio: 3.1 Ratio (ref <=5.0)
Triglycerides: 98 mg/dL (ref <150)
VLDL: 20 mg/dL (ref <30)

## 2015-02-12 ENCOUNTER — Ambulatory Visit: Payer: 59 | Admitting: Internal Medicine

## 2015-02-23 ENCOUNTER — Ambulatory Visit (INDEPENDENT_AMBULATORY_CARE_PROVIDER_SITE_OTHER): Payer: No Typology Code available for payment source | Admitting: Internal Medicine

## 2015-02-23 ENCOUNTER — Encounter: Payer: Self-pay | Admitting: Internal Medicine

## 2015-02-23 VITALS — BP 112/74 | HR 79 | Temp 97.9°F | Ht 66.0 in | Wt 157.0 lb

## 2015-02-23 DIAGNOSIS — Z23 Encounter for immunization: Secondary | ICD-10-CM

## 2015-02-23 DIAGNOSIS — Z8673 Personal history of transient ischemic attack (TIA), and cerebral infarction without residual deficits: Secondary | ICD-10-CM | POA: Diagnosis not present

## 2015-02-23 MED ORDER — HYDROCODONE-ACETAMINOPHEN 5-325 MG PO TABS: 1.0000 | ORAL_TABLET | Freq: Four times a day (QID) | ORAL | Status: DC | PRN

## 2015-02-23 MED ORDER — CYCLOBENZAPRINE HCL 10 MG PO TABS: ORAL_TABLET | ORAL | Status: DC

## 2015-02-23 MED ORDER — BUTALBITAL-APAP-CAFFEINE 50-325-40 MG PO TABS: ORAL_TABLET | ORAL | Status: DC

## 2015-02-23 MED ORDER — ALPRAZOLAM 0.25 MG PO TABS: 0.2500 mg | ORAL_TABLET | Freq: Every evening | ORAL | Status: DC | PRN

## 2015-02-23 NOTE — Progress Notes (Signed)
   Subjective:    Patient ID: Tracie Roach, female    DOB: [birth year], 59 y.o.   MRN: [id number]  HPI  59 year old Female with hx  CVA affecting left arm. Still has paresthesias in left arm. Has recently seen Neurology and had carotid doppler which was stable. More headaches recently due to heat and stress. Lipid panel and liver functions are normal. BP excellent on current  regimen. No new complaints or problems.    Review of Systems     Objective:   Physical Exam Skin warm and dry. Nodes none. Neck is supple without JVD thyromegaly or carotid bruits. Chest clear to auscultation. Cardiac exam regular rate and rhythm. Normal S1 and S2. Extremities without edema. Brief neurological exam no focal deficits except decreased sensation to pinprick left arm.        Assessment & Plan:  CVA affecting left arm with persistent paresthesias-followed by a neurologist  Migraine headaches continue with Elavil prophylaxis, Fioricet and hydrocodone/APAP to treat acute episodes  Hyperlipidemia stable on statin therapy  Anxiety-treated with Xanax  Plan: Lipid panel liver functions are entirely within normal limits on statin therapy. Continue with Elavil bedtime for migraine prophylaxis. Flu vaccine given.

## 2015-02-23 NOTE — Patient Instructions (Addendum)
Continue same meds and return in 6 months. Flu vaccine given today. Hydrocodone/APAP written prescription as well as renewed Fioricet. Continue lipid-lowering medication. Has Xanax for anxiety.

## 2015-03-03 ENCOUNTER — Telehealth: Payer: Self-pay | Admitting: Nurse Practitioner

## 2015-03-03 NOTE — Telephone Encounter (Signed)
Dr. Kaleen Odea is calling on behalf of the pt due to a tooth ache. Pt expressed concern about medication interfering with her other medication, specifically motrin and aspirin. Pt wants to know if that is ok. Dr. Kaleen Odea has offered other narcotic medication as well. Dr. Kaleen Odea is sending pt to another Dental Specialist. Please call and advise Dr. Kaleen Odea [phone].

## 2015-03-03 NOTE — Telephone Encounter (Signed)
I spoke to Dr. Kaleen Odea, pt will be seeing endodonist tomorrow and is wondering if pt can take motrin  and 2 tylenol for pain tonight until seen tomorrow.  She currently takes  aspirin.

## 2015-03-03 NOTE — Telephone Encounter (Signed)
I called pt after speaking with Dr. Pearlean Brownie.  He stated it was ok to take the motrin  and 2 tabs tylenol tonight for toothpain. Will be seeing endodontist tomorrow for treatment.

## 2015-05-21 ENCOUNTER — Other Ambulatory Visit: Payer: Self-pay | Admitting: Internal Medicine

## 2015-06-08 ENCOUNTER — Ambulatory Visit (INDEPENDENT_AMBULATORY_CARE_PROVIDER_SITE_OTHER): Payer: No Typology Code available for payment source | Admitting: Internal Medicine

## 2015-06-08 ENCOUNTER — Encounter: Payer: Self-pay | Admitting: Internal Medicine

## 2015-06-08 VITALS — BP 122/76 | HR 123 | Temp 98.7°F | Resp 22 | Ht 66.0 in | Wt 161.0 lb

## 2015-06-08 DIAGNOSIS — G43011 Migraine without aura, intractable, with status migrainosus: Secondary | ICD-10-CM

## 2015-06-08 DIAGNOSIS — J0100 Acute maxillary sinusitis, unspecified: Secondary | ICD-10-CM

## 2015-06-08 MED ORDER — PREDNISONE 10 MG PO TABS: ORAL_TABLET | ORAL | Status: DC

## 2015-06-08 MED ORDER — FLUCONAZOLE 150 MG PO TABS: 150.0000 mg | ORAL_TABLET | Freq: Once | ORAL | Status: DC

## 2015-06-08 MED ORDER — HYDROCODONE-HOMATROPINE 5-1.5 MG/5ML PO SYRP: 5.0000 mL | ORAL_SOLUTION | Freq: Three times a day (TID) | ORAL | Status: DC | PRN

## 2015-06-08 MED ORDER — AMOXICILLIN-POT CLAVULANATE 875-125 MG PO TABS: 1.0000 | ORAL_TABLET | Freq: Two times a day (BID) | ORAL | Status: DC

## 2015-06-08 NOTE — Patient Instructions (Signed)
Take prednisone taper as directed. Hycodan as needed for cough. Augmentin twice daily with food for 10 days. Rest and drink plenty of fluids.

## 2015-06-08 NOTE — Progress Notes (Signed)
   Subjective:    Patient ID: Tracie Roach, female    DOB: 27-Mar-1956, 59 y.o.   MRN: [id number]  HPI Came down last week with acute URI symptoms and has not improved. Sounds nasally congested and has had protracted headache for several days not responding to Fioricet. No fever or shaking chills. Has had malaise and fatigue.    Review of Systems     Objective:   Physical Exam TMs are dull and full bilaterally. Pharynx is injected without exudate. Sounds nasally congested when she speaks. Neck is supple without adenopathy. Chest clear to auscultation. Skin warm and dry.       Assessment & Plan:  Acute maxillary sinusitis  Intractable migraine headache  Plan: Hycodan 1 teaspoon by mouth every 8 hours when necessary cough. Augmentin 875 mg twice daily for 10 days. Diflucan if needed for Candida vaginitis following antibiotics. Sterapred DS 10 mg 6 day dosepak. Rest and drink plenty of fluids.

## 2015-08-09 ENCOUNTER — Other Ambulatory Visit: Payer: Self-pay

## 2015-08-09 MED ORDER — SIMVASTATIN 20 MG PO TABS: 20.0000 mg | ORAL_TABLET | Freq: Every day | ORAL | Status: DC

## 2015-08-13 ENCOUNTER — Other Ambulatory Visit: Payer: BLUE CROSS/BLUE SHIELD | Admitting: Internal Medicine

## 2015-08-13 DIAGNOSIS — I10 Essential (primary) hypertension: Secondary | ICD-10-CM

## 2015-08-13 DIAGNOSIS — Z130 Encounter for screening for diseases of the blood and blood-forming organs and certain disorders involving the immune mechanism: Secondary | ICD-10-CM

## 2015-08-13 DIAGNOSIS — E785 Hyperlipidemia, unspecified: Secondary | ICD-10-CM

## 2015-08-13 DIAGNOSIS — Z1321 Encounter for screening for nutritional disorder: Secondary | ICD-10-CM

## 2015-08-13 DIAGNOSIS — Z0000 Encounter for general adult medical examination without abnormal findings: Secondary | ICD-10-CM

## 2015-08-13 DIAGNOSIS — Z1329 Encounter for screening for other suspected endocrine disorder: Secondary | ICD-10-CM

## 2015-08-13 HISTORY — PX: OTHER SURGICAL HISTORY: SHX169

## 2015-08-13 LAB — CBC WITH DIFFERENTIAL/PLATELET
BASOS PCT: 0 % (ref 0–1)
Basophils Absolute: 0 10*3/uL (ref 0.0–0.1)
EOS ABS: 0.2 10*3/uL (ref 0.0–0.7)
EOS PCT: 3 % (ref 0–5)
HCT: 41.6 % (ref 36.0–46.0)
HEMOGLOBIN: 14 g/dL (ref 12.0–15.0)
Lymphocytes Relative: 29 % (ref 12–46)
Lymphs Abs: 2.1 10*3/uL (ref 0.7–4.0)
MCH: 30.6 pg (ref 26.0–34.0)
MCHC: 33.7 g/dL (ref 30.0–36.0)
MCV: 90.8 fL (ref 78.0–100.0)
MONO ABS: 0.4 10*3/uL (ref 0.1–1.0)
MPV: 10.3 fL (ref 8.6–12.4)
Monocytes Relative: 6 % (ref 3–12)
Neutro Abs: 4.5 10*3/uL (ref 1.7–7.7)
Neutrophils Relative %: 62 % (ref 43–77)
PLATELETS: 323 10*3/uL (ref 150–400)
RBC: 4.58 MIL/uL (ref 3.87–5.11)
RDW: 13 % (ref 11.5–15.5)
WBC: 7.3 10*3/uL (ref 4.0–10.5)

## 2015-08-13 LAB — COMPLETE METABOLIC PANEL WITH GFR
ALBUMIN: 4.2 g/dL (ref 3.6–5.1)
ALK PHOS: 109 U/L (ref 33–130)
ALT: 16 U/L (ref 6–29)
AST: 20 U/L (ref 10–35)
BUN: 11 mg/dL (ref 7–25)
CHLORIDE: 104 mmol/L (ref 98–110)
CO2: 28 mmol/L (ref 20–31)
Calcium: 10.3 mg/dL (ref 8.6–10.4)
Creat: 0.81 mg/dL (ref 0.50–1.05)
GFR, EST NON AFRICAN AMERICAN: 80 mL/min (ref >=60)
GFR, Est African American: >89 mL/min (ref >=60)
GLUCOSE: 98 mg/dL (ref 65–99)
POTASSIUM: 5.7 mmol/L — AB (ref 3.5–5.3)
SODIUM: 144 mmol/L (ref 135–146)
Total Bilirubin: 0.4 mg/dL (ref 0.2–1.2)
Total Protein: 7.1 g/dL (ref 6.1–8.1)

## 2015-08-13 LAB — LIPID PANEL
CHOL/HDL RATIO: 2.8 ratio (ref <=5.0)
Cholesterol: 166 mg/dL (ref 125–200)
HDL: 59 mg/dL (ref >=46)
LDL CALC: 91 mg/dL (ref <130)
Triglycerides: 81 mg/dL (ref <150)
VLDL: 16 mg/dL (ref <30)

## 2015-08-14 LAB — VITAMIN D 25 HYDROXY (VIT D DEFICIENCY, FRACTURES): VIT D 25 HYDROXY: 45 ng/mL (ref 30–100)

## 2015-08-14 LAB — TSH: TSH: 2.58 mIU/L

## 2015-08-16 ENCOUNTER — Encounter: Payer: Self-pay | Admitting: Internal Medicine

## 2015-08-16 ENCOUNTER — Ambulatory Visit (INDEPENDENT_AMBULATORY_CARE_PROVIDER_SITE_OTHER): Payer: BLUE CROSS/BLUE SHIELD | Admitting: Internal Medicine

## 2015-08-16 VITALS — BP 130/76 | HR 81 | Temp 97.4°F | Resp 20 | Ht 66.0 in | Wt 163.0 lb

## 2015-08-16 DIAGNOSIS — Z8719 Personal history of other diseases of the digestive system: Secondary | ICD-10-CM

## 2015-08-16 DIAGNOSIS — I10 Essential (primary) hypertension: Secondary | ICD-10-CM

## 2015-08-16 DIAGNOSIS — R202 Paresthesia of skin: Secondary | ICD-10-CM | POA: Insufficient documentation

## 2015-08-16 DIAGNOSIS — E875 Hyperkalemia: Secondary | ICD-10-CM

## 2015-08-16 DIAGNOSIS — Z0000 Encounter for general adult medical examination without abnormal findings: Secondary | ICD-10-CM

## 2015-08-16 DIAGNOSIS — Z8669 Personal history of other diseases of the nervous system and sense organs: Secondary | ICD-10-CM

## 2015-08-16 DIAGNOSIS — R6882 Decreased libido: Secondary | ICD-10-CM | POA: Insufficient documentation

## 2015-08-16 DIAGNOSIS — F411 Generalized anxiety disorder: Secondary | ICD-10-CM | POA: Diagnosis not present

## 2015-08-16 DIAGNOSIS — I6930 Unspecified sequelae of cerebral infarction: Secondary | ICD-10-CM

## 2015-08-16 DIAGNOSIS — Z8673 Personal history of transient ischemic attack (TIA), and cerebral infarction without residual deficits: Secondary | ICD-10-CM | POA: Diagnosis not present

## 2015-08-16 DIAGNOSIS — E785 Hyperlipidemia, unspecified: Secondary | ICD-10-CM

## 2015-08-16 DIAGNOSIS — H43812 Vitreous degeneration, left eye: Secondary | ICD-10-CM

## 2015-08-16 LAB — POTASSIUM: POTASSIUM: 4.4 mmol/L (ref 3.5–5.3)

## 2015-08-16 LAB — POCT URINALYSIS DIPSTICK
Bilirubin, UA: NEGATIVE
Blood, UA: NEGATIVE
GLUCOSE UA: NEGATIVE
Ketones, UA: NEGATIVE
LEUKOCYTES UA: NEGATIVE
Nitrite, UA: NEGATIVE
PROTEIN UA: NEGATIVE
SPEC GRAV UA: 1.01
UROBILINOGEN UA: 0.2
pH, UA: 7.5

## 2015-08-16 MED ORDER — BUTALBITAL-APAP-CAFFEINE 50-325-40 MG PO TABS: ORAL_TABLET | ORAL | Status: DC

## 2015-08-16 MED ORDER — CYCLOBENZAPRINE HCL 10 MG PO TABS: ORAL_TABLET | ORAL | Status: DC

## 2015-08-16 MED ORDER — HYDROCODONE-ACETAMINOPHEN 5-325 MG PO TABS: 1.0000 | ORAL_TABLET | Freq: Four times a day (QID) | ORAL | Status: DC | PRN

## 2015-08-16 MED ORDER — ALPRAZOLAM 0.25 MG PO TABS: 0.2500 mg | ORAL_TABLET | Freq: Every evening | ORAL | Status: DC | PRN

## 2015-08-16 NOTE — Patient Instructions (Addendum)
Continue same medications and return in 6 months. Serum potassium repeated today. See ophthalmologist regarding vitreous detachment

## 2015-08-16 NOTE — Progress Notes (Signed)
Subjective:    Patient ID: Tracie Roach, female    DOB: [birth year], 60 y.o.   MRN: [id number]  HPI  60 year old White Female in today for health maintenance exam and evaluation of multiple medical issues including history of migraine headache, hyperlipidemia, essential hypertension, anxiety, history of acute ischemic right thalamic stroke in 2012-10-19 with persistent left upper extremity numbness and tightness which is treated with Flexeril and occasional hydrocodone/APAP tablet. She's followed by a neurologist. History of constipation, essential tremor as well. Says left arm numbness and tightness has interfered with sexual intercourse. She has less interest in sex. She and her husband used to have intercourse almost daily. Sometimes cannot find comfortable position due to left arm pain.  No known drug allergies  Past medical history: Occasional urinary tract infection. Appendectomy April 2011.  Family history: Father died in 2007/10/20 with pulmonary fibrosis. Mother with history of CABG and valve replacement. One sister in good health. No brothers.  Social history: Married with 2 adult children who are also married. Does not smoke or consume alcohol. She quit smoking in March 2013. She used to smoke 6 or 7 cigarettes daily. 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 Review of lab work today show serum potassium of 5.7. She has no reason to have hyperkalemia and I suspect this was a hemolyzed specimen. This will be repeated today. CBC, TSH, lipid panel and liver functions are normal. Renal functions are normal.  Review of Systems recently diagnosed by optometrist with vitreous detachment left eye. Is seeing floaters. This was frightening to her because it was on the sinusitis or stroke. She wears glasses. Continues to have issues with migraine headaches. These are controlled with Fioricet. They have been present for years. I think they are exacerbated by stress and anxiety. She  does have Xanax for anxiety.    Objective:   Physical Exam  Constitutional: She is oriented to person, place, and time. She appears well-developed and well-nourished. No distress.  HENT:  Head: Normocephalic and atraumatic.  Right Ear: External ear normal.  Left Ear: External ear normal.  Mouth/Throat: Oropharynx is clear and moist. No oropharyngeal exudate.  Eyes: Conjunctivae and EOM are normal. Pupils are equal, round, and reactive to light.  Neck: Neck supple. No JVD present. No thyromegaly present.  Cardiovascular: Normal rate, regular rhythm, normal heart sounds and intact distal pulses.   No murmur heard. Pulmonary/Chest: Effort normal and breath sounds normal. No respiratory distress. She has no wheezes. She has no rales.  Abdominal: Soft. Bowel sounds are normal. She exhibits no distension and no mass. There is no tenderness. There is no rebound.  Genitourinary:  Pap taken Oct 19, 2013. Bimanual normal.  Musculoskeletal: She exhibits no edema.  Neurological: She is alert and oriented to person, place, and time. She has normal reflexes. No cranial nerve deficit. Coordination normal.  Skin: Skin is warm and dry. No rash noted. She is not diaphoretic.  Psychiatric: She has a normal mood and affect. Her behavior is normal. Judgment and thought content normal.  Vitals reviewed.         Assessment & Plan:  Left vitreous detachment-with history of stroke patient should have an ophthalmologist to see on a regular basis. Given several names of ophthalmologists in the area  History of right thalamic infarct 10/19/12 with residual left upper extremity numbness and tightness  Anxiety  Hyperlipidemia  Essential hypertension  History of constipation  Essential tremor  History of migraine headaches  Plan: Continue  same medications. Cholesterol and blood pressure are under excellent control. Return in 6 months for office visit lipid panel liver functions and blood pressure check. Have  refilled today Xanax, Fioricet, hydrocodone/APAP and Flexeril for her. She may want to speak with neurology nurse practitioner about her concerns about sexual intercourse positions with her left arm numbness and tightness. I think she is going to have to try to find a compromise with that. Spoke with her about this for some 10 minutes today. Decrease in libido is normal with age in females.

## 2015-08-23 ENCOUNTER — Encounter: Payer: Self-pay | Admitting: Nurse Practitioner

## 2015-08-23 ENCOUNTER — Ambulatory Visit (INDEPENDENT_AMBULATORY_CARE_PROVIDER_SITE_OTHER): Payer: BLUE CROSS/BLUE SHIELD | Admitting: Nurse Practitioner

## 2015-08-23 VITALS — BP 124/78 | HR 88 | Ht 66.0 in | Wt 166.6 lb

## 2015-08-23 DIAGNOSIS — Z8669 Personal history of other diseases of the nervous system and sense organs: Secondary | ICD-10-CM | POA: Diagnosis not present

## 2015-08-23 DIAGNOSIS — Z8673 Personal history of transient ischemic attack (TIA), and cerebral infarction without residual deficits: Secondary | ICD-10-CM | POA: Diagnosis not present

## 2015-08-23 DIAGNOSIS — E785 Hyperlipidemia, unspecified: Secondary | ICD-10-CM

## 2015-08-23 DIAGNOSIS — R209 Unspecified disturbances of skin sensation: Secondary | ICD-10-CM

## 2015-08-23 NOTE — Progress Notes (Signed)
[facility]  PATIENT: Tracie Roach DOB: March 07, 1956   REASON FOR VISIT:  Follow-up for  thalmic infarct,  History of migraines HISTORY FROM: patient   HISTORY OF PRESENT ILLNESS:Ms. Goodgame, 60 year old female returns for followup. She was last seen in this office 08/18/14.  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 She has not had further stroke or TIA symptoms. She is currently on aspirin 325 daily. She continues to have paresthesias of the left hand and arm she has had these since her stroke. They're not disabling just annoying. They occur more frequently when she is fatigued. Her recent labs with lipid profile reviewed LDL 91. 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 She will need a repeat in  1 year. 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 Vascular risk factor of hyperlipidemia only. She also has history of chronic migraine headaches.  She was last seen by Dr. Pearlean Brownie 09/25/12 for first office followup visit for hospital admission form stroke on 06/28/12. She presented with left hand and feet paresthesias. She was not a candidate for TPA due to the symptoms going on for 3-4 days and waxing and waning. CT scan of head was unremarkable but MRI scan showed a small right thalamic infarct. MRA of the brain and carotid Dopplers did not show large vessel stenosis. Transthoracic echo showed normal ejection fraction. She was started on aspirin for stroke prevention and a statin for her hyperlipidemia. She was restarted on amitriptyline for her chronic migraines. She states her migraines are somewhat improved but still occur about 4 times a month. Her primary physician had recently increased her amitriptyline to 20 mg at night. She continues to take Fioricet as needed which  provides good relief. She does admit to significant stress due to the health of her mother who is now living with her and she is the caregiver. She has cut back on caffeine. She still has some paresthesias in the fingertips and left arm. This is annoying but not bothersome. She does not want specific medications for this at the present time. She is tolerating aspirin well but does have minor bruising. She denies significant myalgias on her statin.   REVIEW OF SYSTEMS: Full 14 system review of systems performed and notable only for those listed, all others are neg:  Constitutional: neg  Cardiovascular: neg Ear/Nose/Throat: neg  Skin: neg Eyes: neg Respiratory: neg Gastroitestinal: neg  Hematology/Lymphatic: neg  Endocrine: neg Musculoskeletal: Joint pain Allergy/Immunology: neg Neurological:  And occasional headache, occasional numbness in the left arm when fatigued since stroke Psychiatric: neg Sleep : neg   ALLERGIES: No Known Allergies  HOME MEDICATIONS: Outpatient Prescriptions Prior to Visit  Medication Sig Dispense Refill  . ALPRAZolam (XANAX) 0.25 MG tablet Take 1 tablet (0.25 mg total) by mouth at bedtime as needed. 34 tablet 5  . amitriptyline (ELAVIL) 10 MG tablet TAKE 2 TABLETS AT BEDTIME FOR MIGRAINE PROPHYLAXIS 60 tablet 11  . aspirin EC 325 MG EC tablet Take 1 tablet (325 mg total) by mouth daily.    . B Complex-C (B-COMPLEX WITH VITAMIN C) tablet Take 1 tablet by mouth daily.    . butalbital-acetaminophen-caffeine (FIORICET, ESGIC) 50-325-40 MG tablet TAKE 1 OR 2 TABLETS BY MOUTH EVERY 12 HOURS AS NEEDED FOR HEADACHES 60 tablet 1  .  CALCIUM PO Take by mouth.    . cyclobenzaprine (FLEXERIL) 10 MG tablet TAKE 1/2-1 TABLET AT BEDTIME AS NEEDED 30 tablet 5  . HYDROcodone-acetaminophen (NORCO) 5-325 MG tablet Take 1 tablet by mouth every 6 (six) hours as needed. 90 tablet 0  . ketoconazole (NIZORAL) 2 % shampoo Apply topically 2 (two) times a week. (Patient taking  differently: Apply topically 2 (two) times a week. Takes prn) 120 mL 0  . losartan (COZAAR) 50 MG tablet TAKE 1 TABLET (50 MG TOTAL) BY MOUTH DAILY. 30 tablet 11  . magnesium gluconate (MAGONATE) 500 MG tablet Take 1 tablet (500 mg total) by mouth daily.    . Multiple Vitamins-Minerals (EYE VITAMINS) CAPS Take by mouth.    . Multiple Vitamins-Minerals (MULTIVITAMIN WITH MINERALS) tablet Take 1 tablet by mouth daily.    . polyethylene glycol (MIRALAX / GLYCOLAX) packet Take 17 g by mouth every other day.    . simvastatin (ZOCOR) 20 MG tablet Take 1 tablet (20 mg total) by mouth daily. 30 tablet 11   No facility-administered medications prior to visit.    PAST MEDICAL HISTORY: Past Medical History  Diagnosis Date  . Functional constipation   . Essential tremor     PAST SURGICAL HISTORY: Past Surgical History  Procedure Laterality Date  . Appendectomy    . Posterior vitreous detachment Left 08/13/15    benign    FAMILY HISTORY: Family History  Problem Relation Age of Onset  . Heart disease Mother     SOCIAL HISTORY: Social History   Social History  . Marital Status: Married    Spouse Name: JALEXA PIFER  . Number of Children: 2  . Years of Education: college   Occupational History  . Not on file.   Social History Main Topics  . Smoking status: Former Smoker -- 0.30 packs/day    Quit date: 08/27/2011  . Smokeless tobacco: Never Used  . Alcohol Use: No  . Drug Use: No  . Sexual Activity: Not on file   Other Topics Concern  . Not on file   Social History Narrative   Patient lives with husband [name].    Patient has 2 children.    Patient is a homemaker.    Patient has 2 years of community college.      PHYSICAL EXAM  Filed Vitals:   08/23/15 1020  BP: 124/78  Pulse: 88  Height:  (1.676 m)  Weight: 166 lb 9.6 oz (75.569 kg)   Body mass index is 26.9 kg/(m^2). Generalized: Well developed, in no acute distress  Head: normocephalic and atraumatic,.  Oropharynx benign  Neck: Supple, no carotid bruits  Cardiac: Regular rate rhythm, no murmur  Musculoskeletal: No deformity  Neurological examination  Mentation: Alert oriented to time, place, history taking. Follows all commands speech and language fluent  Cranial nerve II-XII: Pupils were equal round reactive to light extraocular movements were full, visual field were full on confrontational test. Facial sensation and strength were normal. hearing was intact to finger rubbing bilaterally. Uvula tongue midline. head turning and shoulder shrug were normal and symmetric.Tongue protrusion into cheek strength was normal.  Motor: normal bulk and tone, full strength in the BUE, BLE, fine finger movements normal, no pronator drift. No focal weakness  Sensory: normal and symmetric to light touch, pinprick, and Vibration. Subjective paresthesias in the left hand and arm .  Coordination: finger-nose-finger, heel-to-shin bilaterally, no dysmetria  Reflexes: 1+ upper and lower symmetric  Gait and Station: Rising up from seated  position without assistance, normal stance, moderate stride, good arm swing, smooth turning, able to perform tiptoe, and heel walking without difficulty. Tandem gait is steady  DIAGNOSTIC DATA (LABS, IMAGING, TESTING) - I reviewed patient records, labs, notes, testing and imaging myself where available.  Lab Results  Component Value Date   WBC 7.3 08/13/2015   HGB 14.0 08/13/2015   HCT 41.6 08/13/2015   MCV 90.8 08/13/2015   PLT 323 08/13/2015      Component Value Date/Time   NA 144 08/13/2015 0913   K 4.4 08/16/2015 1456   CL 104 08/13/2015 0913   CO2 28 08/13/2015 0913   GLUCOSE 98 08/13/2015 0913   BUN 11 08/13/2015 0913   CREATININE 0.81 08/13/2015 0913   CREATININE 0.69 06/29/2012 0242   CALCIUM 10.3 08/13/2015 0913   PROT 7.1 08/13/2015 0913   ALBUMIN 4.2 08/13/2015 0913   AST 20 08/13/2015 0913   ALT 16 08/13/2015 0913   ALKPHOS 109 08/13/2015 0913     BILITOT 0.4 08/13/2015 0913   GFRNONAA 80 08/13/2015 0913   GFRNONAA >90 06/29/2012 0242   GFRAA >89 08/13/2015 0913   GFRAA >90 06/29/2012 0242   Lab Results  Component Value Date   CHOL 166 08/13/2015   HDL 59 08/13/2015   LDLCALC 91 08/13/2015   TRIG 81 08/13/2015   CHOLHDL 2.8 08/13/2015    No results found for: [id number] Lab Results  Component Value Date   TSH 2.58 08/13/2015      ASSESSMENT AND PLAN 60 y.o. year old female has a past medical history of Headache(784.0); High cholesterol; and Stroke with residual paresthesias. The patient is a current patient of Dr. Pearlean BrownieSethi  who is out of the office today . This note is sent to the work in doctor.     Carotid Doppler in August 2016 consistent with 50-69% stenosis of the left mid/distal ICAs, will repeat in 1 year Reviewed lipid profile and CBC CMP and thyroid from 08/13/2015. Continue Zocor Continue aspirin for secondary stroke prevention Continue amitriptyline for migraine prevention, refilled by Dr. Lenord FellersBaxley Call for any strokelike symptoms or increase in headaches Follow-up yearly Nilda RiggsNancy Carolyn Charie Pinkus, [facility] SystemGNP, [facility]BC, APRN  [facility] [address], [address] [place], [place] [address] [phone]

## 2015-08-23 NOTE — Patient Instructions (Signed)
Carotid Doppler in August 2016 consistent with 50-69% stenosis of the left mid/distal ICAs, will repeat next year Reviewed lipid profile and CBC CMP and thyroid from 02//2017. Continue aspirin for secondary stroke prevention Continue amitriptyline for migraine prevention, does not need refills Call for any strokelike symptoms or increase in headaches Follow-up yearly

## 2015-08-23 NOTE — Progress Notes (Signed)
I have read the note, and I agree with the clinical assessment and plan.  Tracie Roach,Tracie Roach   

## 2015-09-08 ENCOUNTER — Other Ambulatory Visit: Payer: Self-pay | Admitting: Internal Medicine

## 2015-09-08 NOTE — Telephone Encounter (Signed)
Refill x 6 months 

## 2015-09-10 NOTE — Telephone Encounter (Signed)
Phoned to [facility] 

## 2015-10-18 ENCOUNTER — Other Ambulatory Visit: Payer: Self-pay

## 2015-10-18 DIAGNOSIS — Z1231 Encounter for screening mammogram for malignant neoplasm of breast: Secondary | ICD-10-CM

## 2015-11-25 ENCOUNTER — Ambulatory Visit
Admission: RE | Admit: 2015-11-25 | Discharge: 2015-11-25 | Disposition: A | Discharge status: Home / Self Care | Payer: BLUE CROSS/BLUE SHIELD | Source: Ambulatory Visit

## 2015-11-25 DIAGNOSIS — Z1231 Encounter for screening mammogram for malignant neoplasm of breast: Secondary | ICD-10-CM

## 2016-02-15 ENCOUNTER — Other Ambulatory Visit: Payer: Self-pay | Admitting: Internal Medicine

## 2016-02-15 ENCOUNTER — Other Ambulatory Visit: Payer: BLUE CROSS/BLUE SHIELD | Admitting: Internal Medicine

## 2016-02-15 DIAGNOSIS — I10 Essential (primary) hypertension: Secondary | ICD-10-CM | POA: Diagnosis not present

## 2016-02-15 DIAGNOSIS — Z0000 Encounter for general adult medical examination without abnormal findings: Secondary | ICD-10-CM | POA: Diagnosis not present

## 2016-02-15 DIAGNOSIS — E785 Hyperlipidemia, unspecified: Secondary | ICD-10-CM | POA: Diagnosis not present

## 2016-02-15 LAB — HEPATIC FUNCTION PANEL
ALK PHOS: 112 U/L (ref 33–130)
ALT: 16 U/L (ref 6–29)
AST: 19 U/L (ref 10–35)
Albumin: 4.1 g/dL (ref 3.6–5.1)
BILIRUBIN INDIRECT: 0.2 mg/dL (ref 0.2–1.2)
Bilirubin, Direct: 0.1 mg/dL (ref <=0.2)
TOTAL PROTEIN: 6.7 g/dL (ref 6.1–8.1)
Total Bilirubin: 0.3 mg/dL (ref 0.2–1.2)

## 2016-02-15 LAB — LIPID PANEL
CHOLESTEROL: 162 mg/dL (ref 125–200)
HDL: 56 mg/dL (ref >=46)
LDL Cholesterol: 91 mg/dL (ref <130)
TRIGLYCERIDES: 74 mg/dL (ref <150)
Total CHOL/HDL Ratio: 2.9 Ratio (ref <=5.0)
VLDL: 15 mg/dL (ref <30)

## 2016-02-17 ENCOUNTER — Encounter: Payer: Self-pay | Admitting: Internal Medicine

## 2016-02-17 ENCOUNTER — Ambulatory Visit (INDEPENDENT_AMBULATORY_CARE_PROVIDER_SITE_OTHER): Payer: BLUE CROSS/BLUE SHIELD | Admitting: Internal Medicine

## 2016-02-17 VITALS — BP 112/62 | HR 71 | Temp 98.2°F | Ht 66.0 in | Wt 162.5 lb

## 2016-02-17 DIAGNOSIS — I10 Essential (primary) hypertension: Secondary | ICD-10-CM | POA: Diagnosis not present

## 2016-02-17 DIAGNOSIS — R202 Paresthesia of skin: Secondary | ICD-10-CM

## 2016-02-17 DIAGNOSIS — E785 Hyperlipidemia, unspecified: Secondary | ICD-10-CM | POA: Diagnosis not present

## 2016-02-17 DIAGNOSIS — Z8719 Personal history of other diseases of the digestive system: Secondary | ICD-10-CM | POA: Diagnosis not present

## 2016-02-17 DIAGNOSIS — F411 Generalized anxiety disorder: Secondary | ICD-10-CM | POA: Diagnosis not present

## 2016-02-17 DIAGNOSIS — I6930 Unspecified sequelae of cerebral infarction: Secondary | ICD-10-CM | POA: Diagnosis not present

## 2016-02-17 DIAGNOSIS — Z8669 Personal history of other diseases of the nervous system and sense organs: Secondary | ICD-10-CM | POA: Diagnosis not present

## 2016-02-17 MED ORDER — HYDROCODONE-ACETAMINOPHEN 5-325 MG PO TABS: 1.0000 | ORAL_TABLET | Freq: Four times a day (QID) | ORAL | 0 refills | Status: DC | PRN

## 2016-02-17 NOTE — Progress Notes (Signed)
   Subjective:    Patient ID: Tracie Roach, female    DOB: [birth year], 60 y.o.   MRN: [id number]  HPI  60 year old Female for follow up of hyperlipidemia. Had one severe headache this past month but numerous less intense headaches. Is to keep a diary. Changes in weather does cause a headache.  Lipid panel/ liver functions  normal.  Blood pressure under excellent control. History of essential hypertension. She had an acute ischemic right thalamic stroke in 2014 with persistent left upper extremity numbness and tightness. She is followed by neurologist. She has a history of constipation and essential tremor.  History of anxiety.  History of left vitreous detachment.         Review of Systems see above     Objective:   Physical Exam Neck is supple without JVD thyromegaly or carotid bruits. Chest clear. Cardiac exam regular rate and rhythm normal S1 and S2. Extremities without edema.       Assessment & Plan:  History of right thalamic stroke 2014 with residual left upper extremity numbness and tightness  Anxiety-treated with antianxiety medication  History of migraine headaches-meds refilled  Hyperlipidemia-lipid panel liver functions normal on statin medication  Essential hypertension-stable on medication  Essential tremor-stable  Plan: Continue same medications and return in 6 months

## 2016-03-01 ENCOUNTER — Other Ambulatory Visit: Payer: Self-pay | Admitting: Internal Medicine

## 2016-03-01 NOTE — Telephone Encounter (Signed)
Refill x 6 months 

## 2016-03-03 NOTE — Telephone Encounter (Signed)
Called in to [id number] [place] [place] - [address]: [phone]

## 2016-03-12 NOTE — Patient Instructions (Signed)
Is pleasure to see a day. Continue same medications and return in 6 months.

## 2016-03-24 DIAGNOSIS — Z23 Encounter for immunization: Secondary | ICD-10-CM | POA: Diagnosis not present

## 2016-05-15 ENCOUNTER — Other Ambulatory Visit: Payer: Self-pay | Admitting: Internal Medicine

## 2016-07-24 ENCOUNTER — Encounter: Payer: Self-pay | Admitting: Internal Medicine

## 2016-07-24 ENCOUNTER — Ambulatory Visit (INDEPENDENT_AMBULATORY_CARE_PROVIDER_SITE_OTHER): Payer: BLUE CROSS/BLUE SHIELD | Admitting: Internal Medicine

## 2016-07-24 VITALS — BP 120/82 | HR 93 | Temp 99.5°F | Wt 165.0 lb

## 2016-07-24 DIAGNOSIS — J22 Unspecified acute lower respiratory infection: Secondary | ICD-10-CM

## 2016-07-24 DIAGNOSIS — J9801 Acute bronchospasm: Secondary | ICD-10-CM

## 2016-07-24 MED ORDER — AMOXICILLIN-POT CLAVULANATE 875-125 MG PO TABS: 1.0000 | ORAL_TABLET | Freq: Two times a day (BID) | ORAL | 0 refills | Status: DC

## 2016-07-24 MED ORDER — PREDNISONE 10 MG PO TABS: ORAL_TABLET | ORAL | 0 refills | Status: DC

## 2016-07-24 MED ORDER — FLUCONAZOLE 150 MG PO TABS: 150.0000 mg | ORAL_TABLET | Freq: Once | ORAL | 1 refills | Status: AC

## 2016-07-24 MED ORDER — HYDROCODONE-HOMATROPINE 5-1.5 MG/5ML PO SYRP: 5.0000 mL | ORAL_SOLUTION | Freq: Three times a day (TID) | ORAL | 0 refills | Status: DC | PRN

## 2016-07-24 NOTE — Progress Notes (Signed)
   Subjective:    Patient ID: Tracie Roach, female    DOB: Feb 07, 1956, 61 y.o.   MRN: [id number]  HPI   2 day history of URI symptoms. Started as sratchy throat and progressd to a deep cough with discolored sputum production. No fever or chills. Has  malaise and fatigue. She has Anxiety disorder. Remote history of stroke.      Review of Systems  See above     Objective:   Physical Exam   Skin warm and dry. Nodes none. Has deep  Congested cough. Neck is supple without  significant adenopathy. Chest is  without rales but there is some scattered wheezingwith inspiration      Assessment & Plan:   Acute bronchitis  Acute bronchospasm  Plan: Sterapred 10mg  6 day  Dosepak to take in tapering course as directed.  Augmentin 875 mg twice daily for 10 days.Rest and drink plenty of fluids.

## 2016-08-09 ENCOUNTER — Other Ambulatory Visit: Payer: Self-pay | Admitting: Internal Medicine

## 2016-08-14 NOTE — Patient Instructions (Addendum)
Augmentin 875 mg twice daily for 10 . Take prednisone in tapering course as directed. Hycodan 1 teaspoon by mouth every 8 hours when necessy for cough. Rest and drink plenty of fluids.

## 2016-08-15 ENCOUNTER — Other Ambulatory Visit: Payer: BLUE CROSS/BLUE SHIELD | Admitting: Internal Medicine

## 2016-08-15 DIAGNOSIS — E785 Hyperlipidemia, unspecified: Secondary | ICD-10-CM

## 2016-08-15 DIAGNOSIS — I10 Essential (primary) hypertension: Secondary | ICD-10-CM

## 2016-08-15 DIAGNOSIS — Z1329 Encounter for screening for other suspected endocrine disorder: Secondary | ICD-10-CM

## 2016-08-15 DIAGNOSIS — Z0000 Encounter for general adult medical examination without abnormal findings: Secondary | ICD-10-CM | POA: Diagnosis not present

## 2016-08-15 DIAGNOSIS — Z1321 Encounter for screening for nutritional disorder: Secondary | ICD-10-CM

## 2016-08-15 LAB — COMPREHENSIVE METABOLIC PANEL
ALBUMIN: 4 g/dL (ref 3.6–5.1)
ALT: 19 U/L (ref 6–29)
AST: 23 U/L (ref 10–35)
Alkaline Phosphatase: 103 U/L (ref 33–130)
BUN: 13 mg/dL (ref 7–25)
CALCIUM: 9.9 mg/dL (ref 8.6–10.4)
CHLORIDE: 105 mmol/L (ref 98–110)
CO2: 26 mmol/L (ref 20–31)
Creat: 0.88 mg/dL (ref 0.50–1.05)
Glucose, Bld: 96 mg/dL (ref 65–99)
POTASSIUM: 4.9 mmol/L (ref 3.5–5.3)
SODIUM: 143 mmol/L (ref 135–146)
Total Bilirubin: 0.4 mg/dL (ref 0.2–1.2)
Total Protein: 6.9 g/dL (ref 6.1–8.1)

## 2016-08-15 LAB — LIPID PANEL
CHOL/HDL RATIO: 2.8 ratio (ref <5.0)
Cholesterol: 180 mg/dL (ref <200)
HDL: 65 mg/dL (ref >50)
LDL CALC: 103 mg/dL — AB (ref <100)
TRIGLYCERIDES: 62 mg/dL (ref <150)
VLDL: 12 mg/dL (ref <30)

## 2016-08-15 LAB — CBC WITH DIFFERENTIAL/PLATELET
BASOS ABS: 0 {cells}/uL (ref 0–200)
Basophils Relative: 0 %
EOS PCT: 6 %
Eosinophils Absolute: 324 cells/uL (ref 15–500)
HCT: 39.8 % (ref 35.0–45.0)
HEMOGLOBIN: 13.1 g/dL (ref 11.7–15.5)
LYMPHS ABS: 2052 {cells}/uL (ref 850–3900)
Lymphocytes Relative: 38 %
MCH: 30.2 pg (ref 27.0–33.0)
MCHC: 32.9 g/dL (ref 32.0–36.0)
MCV: 91.7 fL (ref 80.0–100.0)
MPV: 10.3 fL (ref 7.5–12.5)
Monocytes Absolute: 378 cells/uL (ref 200–950)
Monocytes Relative: 7 %
NEUTROS PCT: 49 %
Neutro Abs: 2646 cells/uL (ref 1500–7800)
Platelets: 337 10*3/uL (ref 140–400)
RBC: 4.34 MIL/uL (ref 3.80–5.10)
RDW: 13.3 % (ref 11.0–15.0)
WBC: 5.4 10*3/uL (ref 3.8–10.8)

## 2016-08-15 LAB — TSH: TSH: 1.66 m[IU]/L

## 2016-08-16 LAB — VITAMIN D 25 HYDROXY (VIT D DEFICIENCY, FRACTURES): VIT D 25 HYDROXY: 50 ng/mL (ref 30–100)

## 2016-08-17 ENCOUNTER — Other Ambulatory Visit (HOSPITAL_COMMUNITY)
Admission: RE | Admit: 2016-08-17 | Discharge: 2016-08-17 | Disposition: A | Discharge status: Home / Self Care | Payer: BLUE CROSS/BLUE SHIELD | Source: Ambulatory Visit | Attending: Internal Medicine | Admitting: Internal Medicine

## 2016-08-17 ENCOUNTER — Encounter: Payer: Self-pay | Admitting: Internal Medicine

## 2016-08-17 ENCOUNTER — Ambulatory Visit (INDEPENDENT_AMBULATORY_CARE_PROVIDER_SITE_OTHER): Payer: BLUE CROSS/BLUE SHIELD | Admitting: Internal Medicine

## 2016-08-17 VITALS — BP 120/80 | HR 80 | Temp 98.5°F | Ht 64.25 in | Wt 162.0 lb

## 2016-08-17 DIAGNOSIS — I10 Essential (primary) hypertension: Secondary | ICD-10-CM | POA: Diagnosis not present

## 2016-08-17 DIAGNOSIS — Z0000 Encounter for general adult medical examination without abnormal findings: Secondary | ICD-10-CM | POA: Diagnosis not present

## 2016-08-17 DIAGNOSIS — F411 Generalized anxiety disorder: Secondary | ICD-10-CM | POA: Diagnosis not present

## 2016-08-17 DIAGNOSIS — Z1211 Encounter for screening for malignant neoplasm of colon: Secondary | ICD-10-CM

## 2016-08-17 DIAGNOSIS — E784 Other hyperlipidemia: Secondary | ICD-10-CM | POA: Diagnosis not present

## 2016-08-17 DIAGNOSIS — Z8719 Personal history of other diseases of the digestive system: Secondary | ICD-10-CM

## 2016-08-17 DIAGNOSIS — Z124 Encounter for screening for malignant neoplasm of cervix: Secondary | ICD-10-CM

## 2016-08-17 DIAGNOSIS — Z8669 Personal history of other diseases of the nervous system and sense organs: Secondary | ICD-10-CM | POA: Diagnosis not present

## 2016-08-17 DIAGNOSIS — Z01419 Encounter for gynecological examination (general) (routine) without abnormal findings: Secondary | ICD-10-CM | POA: Diagnosis not present

## 2016-08-17 DIAGNOSIS — Z8673 Personal history of transient ischemic attack (TIA), and cerebral infarction without residual deficits: Secondary | ICD-10-CM | POA: Diagnosis not present

## 2016-08-17 DIAGNOSIS — E7849 Other hyperlipidemia: Secondary | ICD-10-CM

## 2016-08-17 LAB — POCT URINALYSIS DIPSTICK
Bilirubin, UA: NEGATIVE
Blood, UA: NEGATIVE
GLUCOSE UA: NEGATIVE
KETONES UA: NEGATIVE
Leukocytes, UA: NEGATIVE
Nitrite, UA: NEGATIVE
PROTEIN UA: NEGATIVE
Spec Grav, UA: <=1.005
Urobilinogen, UA: NEGATIVE
pH, UA: 8

## 2016-08-17 MED ORDER — HYDROCODONE-ACETAMINOPHEN 5-325 MG PO TABS: 1.0000 | ORAL_TABLET | Freq: Four times a day (QID) | ORAL | 0 refills | Status: DC | PRN

## 2016-08-17 MED ORDER — BUTALBITAL-APAP-CAFFEINE 50-325-40 MG PO TABS: ORAL_TABLET | ORAL | 0 refills | Status: DC

## 2016-08-17 NOTE — Progress Notes (Signed)
   Subjective:    Patient ID: Tracie Roach, female    DOB: April 21, 1956, 61 y.o.   MRN: [id number]  HPI  61 year old Female for health maintenance exam and evaluation of medical issues. She is complaining of  neck pain going into  occipital headache particularly with weather changes. Long-standing history of headaches.  Complaining of some bilateral foot pain. Likely has osteoarthritis in her feet.  History of hyperlipidemia, essential hypertension, anxiety, history of acute ischemic right thalamic stroke in 2014 with persistent left upper extremity numbness and tightness which is treated with Flexeril and occasional hydrocodone/APAP tablet.. She's followed by neurologist.  History of constipation and essential tremor.  No known drug allergies  Past medical history: Occasional urinary tract infection. Appendectomy April 2011.  Family history: Father died in 2009 with pulmonary fibrosis. Mother with history of CABG and valve replacement. One sister in good health. No brothers.  Social history: Married with 2 adult children who are also married. Does not smoke or consume alcohol. She quit smoking in March 2013. She is to smoke 6 or 7 cigarettes daily. 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  History of vitreous detachment left eye. She wears glasses.        Review of Systems  HENT: Negative.   Respiratory: Negative.   Cardiovascular: Negative.   Gastrointestinal: Negative.   Endocrine: Negative.   Genitourinary: Negative.        Objective:   Physical Exam  Constitutional: She is oriented to person, place, and time. She appears well-developed and well-nourished. No distress.  HENT:  Head: Normocephalic and atraumatic.  Right Ear: External ear normal.  Left Ear: External ear normal.  Mouth/Throat: Oropharynx is clear and moist.  Eyes: Conjunctivae are normal. Pupils are equal, round, and reactive to light. Right eye exhibits no discharge. Left  eye exhibits no discharge. No scleral icterus.  Neck: Neck supple. No JVD present. No thyromegaly present.  Cardiovascular: Normal rate, regular rhythm, normal heart sounds and intact distal pulses.   No murmur heard. Pulmonary/Chest: Effort normal and breath sounds normal. No respiratory distress. She has no wheezes.  Breasts Normal Female.  Abdominal: Soft. Bowel sounds are normal. She exhibits no distension and no mass. There is no tenderness. There is no rebound and no guarding.  Genitourinary:  Genitourinary Comments: Pap taken. Bimanual normal.  Musculoskeletal: She exhibits no edema.  Lymphadenopathy:    She has no cervical adenopathy.  Neurological: She is alert and oriented to person, place, and time. She has normal reflexes. No cranial nerve deficit. Coordination normal.  Skin: Skin is warm and dry. No rash noted. She is not diaphoretic.  Psychiatric: She has a normal mood and affect. Her behavior is normal. Judgment and thought content normal.  Vitals reviewed.         Assessment & Plan:  Normal health maintenance exam  History of thalamic stroke  Essential hypertension  Bilateral foot pain-osteoarthritis is likely    History of migraine headaches  Hyperlipidemia  Anxiety  History of left history as detachment  Essential tremor  History of constipation treated with MiraLAX. May be due to Elavil and pain medication and/or anti-anxiety medication  Plan: Continue same medications. Lab work reviewed and is within normal limits. Return in 6 months. Needs colonoscopy. Referral made to GI.

## 2016-08-21 ENCOUNTER — Encounter: Payer: Self-pay | Admitting: Nurse Practitioner

## 2016-08-21 ENCOUNTER — Ambulatory Visit (INDEPENDENT_AMBULATORY_CARE_PROVIDER_SITE_OTHER): Payer: BLUE CROSS/BLUE SHIELD | Admitting: Nurse Practitioner

## 2016-08-21 VITALS — BP 126/64 | HR 78 | Resp 14 | Ht 64.25 in | Wt 165.0 lb

## 2016-08-21 DIAGNOSIS — R202 Paresthesia of skin: Secondary | ICD-10-CM | POA: Diagnosis not present

## 2016-08-21 DIAGNOSIS — I10 Essential (primary) hypertension: Secondary | ICD-10-CM

## 2016-08-21 DIAGNOSIS — E785 Hyperlipidemia, unspecified: Secondary | ICD-10-CM | POA: Diagnosis not present

## 2016-08-21 DIAGNOSIS — Z8673 Personal history of transient ischemic attack (TIA), and cerebral infarction without residual deficits: Secondary | ICD-10-CM | POA: Diagnosis not present

## 2016-08-21 LAB — CYTOLOGY - PAP: Diagnosis: NEGATIVE

## 2016-08-21 NOTE — Progress Notes (Signed)
I have read the note, and I agree with the clinical assessment and plan.  Amberlyn Martinezgarcia KEITH   

## 2016-08-21 NOTE — Patient Instructions (Signed)
Will repeat Carotid Doppler and compare to 2016 Reviewed lipid profile and CBC CMP and thyroid from 08/15/2016.  Continue aspirin for secondary stroke prevention Continue amitriptyline for migraine prevention, refilled by Dr. Lenord FellersBaxley Keep systolic blood pressure less than 130, today's reading 126/64 Lipids are followed by Dr. Lenord FellersBaxley  last cholesterol  180LDL 103 Continue Zocor No further stroke or TIA symptoms since 2014 If recurrent stroke symptoms occur, call 911 and proceed to the hospital Discharge from neurologic services at this time  REMEMBER Pneumonic FAST which stands for  F stands  for face drooping and weakness etc. A stands for arms, weakness S stands for speech slurred  T stands for time to call 911

## 2016-08-21 NOTE — Progress Notes (Signed)
[facility]  PATIENT: Tracie Roach DOB: 29-Jan-1956   REASON FOR VISIT:  Follow-up for  thalmic infarct,  History of migraines HISTORY FROM: patient alone at visit   HISTORY OF PRESENT ILLNESS:UPDATE 08/21/2016 CM Tracie Roach, 61 year old female returns for yearly  followup.  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 She has not had further stroke or TIA symptoms. She is currently on aspirin 325 daily without significant bruising or bleeding. She continues to have paresthesias of the left hand and arm she has had these since her stroke. They're not disabling just annoying. There are also intermittent They occur more frequently when she is fatigued. Blood pressure the office today 126/60 Her recent labs with lipid profile reviewed LDL 103. She is currently on Lipitor without complaints of myalgias Last carotid Doppler in August 2016 with 50-69% stenosis it has not changed from 2015. Will repeat  Her headaches are in good control .She is on amitriptyline by Dr. Lenord FellersBaxley. She gets no regular exercise but is attempting to work on that and was encouraged to do so. 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 Vascular risk factor of hyperlipidemia only. She also has history of chronic migraine headaches.  Hospital admission for stroke on 06/28/12. She presented with left hand and feet paresthesias. She was not a candidate for TPA due to the symptoms going on for 3-4 days and waxing and waning. CT scan of head was unremarkable but MRI scan showed a small right thalamic infarct. MRA of the brain and carotid Dopplers did not show large vessel stenosis. Transthoracic echo showed normal ejection fraction. She was started on aspirin for stroke prevention and a statin for her hyperlipidemia. She was restarted on amitriptyline for her chronic migraines. She states her migraines are somewhat improved but still occur about 4 times a  month. Her primary physician had recently increased her amitriptyline to 20 mg at night. She continues to take Fioricet as needed which provides good relief. She does admit to significant stress due to the health of her mother who is now living with her and she is the caregiver. She has cut back on caffeine. She still has some paresthesias in the fingertips and left arm. This is annoying but not bothersome. She does not want specific medications for this at the present time. She is tolerating aspirin well but does have minor bruising. She denies significant myalgias on her statin.   REVIEW OF SYSTEMS: Full 14 system review of systems performed and notable only for those listed, all others are neg:  Constitutional: neg  Cardiovascular: neg Ear/Nose/Throat: neg  Skin: neg Eyes: neg Respiratory: neg Gastroitestinal: neg  Hematology/Lymphatic: neg  Endocrine: neg Musculoskeletal: Joint pain Allergy/Immunology: neg Neurological:  occasional headache, occasional numbness in the left arm when fatigued since stroke Psychiatric: neg Sleep : neg   ALLERGIES: No Known Allergies  HOME MEDICATIONS: Outpatient Medications Prior to Visit  Medication Sig Dispense Refill  . ALPRAZolam (XANAX) 0.25 MG tablet TAKE 1 TABLET BY MOUTH AT BEDTIME AS NEEDED 34 tablet 5  . amitriptyline (ELAVIL) 10 MG tablet TAKE 2 TABLETS AT BEDTIME FOR MIGRAINE PROPHYLAXIS 60 tablet 11  . aspirin EC 325 MG EC tablet Take 1 tablet (325 mg total) by mouth daily.    . B Complex-C (B-COMPLEX WITH VITAMIN C) tablet Take 1 tablet by mouth daily.    . butalbital-acetaminophen-caffeine (FIORICET, ESGIC) 50-325-40 MG tablet TAKE 1 OR 2  TABLETS BY MOUTH EVERY 12 HOURS AS NEEDED FOR HEADACHES 60 tablet 0  . CALCIUM PO Take by mouth.    . cyclobenzaprine (FLEXERIL) 10 MG tablet TAKE 1/2-1 TABLET AT BEDTIME AS NEEDED 30 tablet 5  . HYDROcodone-acetaminophen (NORCO) 5-325 MG tablet Take 1 tablet by mouth every 6 (six) hours as needed.  90 tablet 0  . ketoconazole (NIZORAL) 2 % shampoo Apply topically 2 (two) times a week. 120 mL 0  . losartan (COZAAR) 50 MG tablet TAKE 1 TABLET (50 MG TOTAL) BY MOUTH DAILY. 30 tablet 11  . magnesium gluconate (MAGONATE) 500 MG tablet Take 1 tablet (500 mg total) by mouth daily.    . Multiple Vitamins-Minerals (EYE VITAMINS) CAPS Take by mouth.    . Multiple Vitamins-Minerals (MULTIVITAMIN WITH MINERALS) tablet Take 1 tablet by mouth daily.    . polyethylene glycol (MIRALAX / GLYCOLAX) packet Take 17 g by mouth every other day.    . simvastatin (ZOCOR) 20 MG tablet TAKE 1 TABLET (20 MG TOTAL) BY MOUTH DAILY. 30 tablet 11   No facility-administered medications prior to visit.     PAST MEDICAL HISTORY: Past Medical History:  Diagnosis Date  . Essential tremor   . Functional constipation     PAST SURGICAL HISTORY: Past Surgical History:  Procedure Laterality Date  . APPENDECTOMY    . Posterior vitreous detachment Left 08/13/15   benign    FAMILY HISTORY: Family History  Problem Relation Age of Onset  . Heart disease Mother     SOCIAL HISTORY: Social History   Social History  . Marital status: Married    Spouse name: Tracie Roach  . Number of children: 2  . Years of education: college   Occupational History  . Not on file.   Social History Main Topics  . Smoking status: Former Smoker    Packs/day: 0.30    Quit date: 08/27/2011  . Smokeless tobacco: Never Used  . Alcohol use No  . Drug use: No  . Sexual activity: Not on file   Other Topics Concern  . Not on file   Social History Narrative   Patient lives with husband Tracie Roach.    Patient has 2 children.    Patient is a homemaker.    Patient has 2 years of community college.      PHYSICAL EXAM  Vitals:   08/21/16 1015  BP: 126/64  Pulse: 78  Resp: 14  Weight: 165 lb (74.8 kg)  Height: 5' 4.25" (1.632 m)   Body mass index is 28.1 kg/m. Generalized: Well developed, in no acute distress  Head:  normocephalic and atraumatic,. Oropharynx benign  Neck: Supple, no carotid bruits  Cardiac: Regular rate rhythm, no murmur  Musculoskeletal: No deformity  Neurological examination  Mentation: Alert oriented to time, place, history taking. Follows all commands speech and language fluent  Cranial nerve II-XII: Pupils were equal round reactive to light extraocular movements were full, visual field were full on confrontational test. Facial sensation and strength were normal. hearing was intact to finger rubbing bilaterally. Uvula tongue midline. head turning and shoulder shrug were normal and symmetric.Tongue protrusion into cheek strength was normal.  Motor: normal bulk and tone, full strength in the BUE, BLE, fine finger movements normal, no pronator drift. No focal weakness  Sensory: normal and symmetric to light touch, pinprick, and Vibration in the upper and lower extremities.   Coordination: finger-nose-finger, heel-to-shin bilaterally, no dysmetria , no tremor Reflexes: 1+ upper and lower symmetric  Gait and  Station: Rising up from seated position without assistance, normal stance, moderate stride, good arm swing, smooth turning, able to perform tiptoe, and heel walking without difficulty. Tandem gait is steady  DIAGNOSTIC DATA (LABS, IMAGING, TESTING) - I reviewed patient records, labs, notes, testing and imaging myself where available.  Lab Results  Component Value Date   WBC 5.4 08/15/2016   HGB 13.1 08/15/2016   HCT 39.8 08/15/2016   MCV 91.7 08/15/2016   PLT 337 08/15/2016      Component Value Date/Time   NA 143 08/15/2016 1059   K 4.9 08/15/2016 1059   CL 105 08/15/2016 1059   CO2 26 08/15/2016 1059   GLUCOSE 96 08/15/2016 1059   BUN 13 08/15/2016 1059   CREATININE 0.88 08/15/2016 1059   CALCIUM 9.9 08/15/2016 1059   PROT 6.9 08/15/2016 1059   ALBUMIN 4.0 08/15/2016 1059   AST 23 08/15/2016 1059   ALT 19 08/15/2016 1059   ALKPHOS 103 08/15/2016 1059    BILITOT 0.4 08/15/2016 1059   GFRNONAA 80 08/13/2015 0913   GFRAA >89 08/13/2015 0913   Lab Results  Component Value Date   CHOL 180 08/15/2016   HDL 65 08/15/2016   LDLCALC 103 (H) 08/15/2016   TRIG 62 08/15/2016   CHOLHDL 2.8 08/15/2016    No results found for: VITAMINB12 Lab Results  Component Value Date   TSH 1.66 08/15/2016      ASSESSMENT AND PLAN 61 y.o. year old female has a past medical history of Headache(784.0);hypertension High cholesterol; and Stroke with residual paresthesias which are intermittent. The patient is a current patient of Dr. Pearlean Brownie  who is out of the office today . This note is sent to the work in doctor.     Will repeat Carotid Doppler and compare to 2016 Reviewed lipid profile and CBC CMP and thyroid from 08/15/2016.  Continue aspirin for secondary stroke prevention Continue amitriptyline for migraine prevention, refilled by Dr. Lenord Fellers headache trigger is weather changes Keep systolic blood pressure less than 130, today's reading 126/64 Lipids are followed by Dr. Lenord Fellers  last cholesterol  180LDL 103 Continue Zocor No further stroke or TIA symptoms since 2014 If recurrent stroke symptoms occur, call 911 and proceed to the hospital Discharge from neurologic services at this time  REMEMBER Pneumonic FAST which stands for  F stands  for face drooping and weakness etc. A stands for arms, weakness S stands for speech slurred  T stands for time to call 911 I spent 25 min  in total face to face time with the patient more than 50% of which was spent counseling and coordination of care, reviewing test results reviewing medications and discussing and reviewing the diagnosis of stroke and management of risk factors. Patient was encouraged to get on a regular exercise routine which will be helpful for her overall well-being.  Nilda Riggs, [facility] APRN   [facility] [address], [address] [place], [place] [address] [phone]

## 2016-08-30 ENCOUNTER — Ambulatory Visit (INDEPENDENT_AMBULATORY_CARE_PROVIDER_SITE_OTHER): Payer: BLUE CROSS/BLUE SHIELD

## 2016-08-30 ENCOUNTER — Telehealth: Payer: Self-pay | Admitting: Nurse Practitioner

## 2016-08-30 DIAGNOSIS — E785 Hyperlipidemia, unspecified: Secondary | ICD-10-CM

## 2016-08-30 DIAGNOSIS — Z8673 Personal history of transient ischemic attack (TIA), and cerebral infarction without residual deficits: Secondary | ICD-10-CM | POA: Diagnosis not present

## 2016-08-30 DIAGNOSIS — I10 Essential (primary) hypertension: Secondary | ICD-10-CM

## 2016-08-30 NOTE — Telephone Encounter (Signed)
Pt wants to know if she is to have a repeat US carotid doppler in two to three years and if so will we send her a reminder to schedule it? Pt also wonders if her PCP will tell her when it is time for another one.

## 2016-08-30 NOTE — Patient Instructions (Signed)
Continue same medications and return in 6 months. Referral made to GI for colonoscopy.

## 2016-08-31 NOTE — Telephone Encounter (Signed)
I do not have the results from doppler done yesterday if no change from previous every 3 years to monitor. Dr. Lenord FellersBaxley can set that up for you. Please call

## 2016-08-31 NOTE — Telephone Encounter (Signed)
Spoke to pt and let her know that results not back.  Will call when have these.  At this time if no change from pereious will monitor every 3 years by pcp, Dr. Lenord FellersBaxley.

## 2016-09-06 NOTE — Telephone Encounter (Signed)
Patient called office to see if Carotid results have been received and if she can have a copy mailed to her.

## 2016-09-13 DIAGNOSIS — M722 Plantar fascial fibromatosis: Secondary | ICD-10-CM | POA: Diagnosis not present

## 2016-09-13 NOTE — Telephone Encounter (Signed)
Patient calling to get doppler results. If no answer please leave VM.

## 2016-09-14 ENCOUNTER — Telehealth: Payer: Self-pay | Admitting: Internal Medicine

## 2016-09-14 NOTE — Telephone Encounter (Signed)
Spoke to pt and relayed that carotid doppler study resulted as no significant changes seen compared to previous study 01-27-15.  She verbalized understanding.   Mailed copy to pt.

## 2016-09-14 NOTE — Telephone Encounter (Signed)
She stepped wrong last night getting on the lawnmower and says it caused her plantar fasciiitis to be awakened.  She saw Dr. Farris HasKramer last night.  He told her to take 2 Aleve twice daily.  She wants to know if this is going to mess up any of her other medications that she's taking?    She also wants to know if she could take a Meloxicam of Steve's or any of his Voltaren?  She knows that both of those medications were helpful to him for his plantar fasiiitis recently.  So, she is wondering if this would be helpful to her.  States that at the present, she cannot put hardly any weight on her foot.  She just doesn't want to change any of her medications without asking you first.    Please advise.    Best number [phone]

## 2016-09-14 NOTE — Telephone Encounter (Signed)
What Dr. Farris HasKramer suggested is fine.

## 2016-09-14 NOTE — Telephone Encounter (Signed)
Spoke with patient and advised per Dr. Beryle QuantBaxley's instructions.  Patient still wanted to know about her husband's medications.  Advised Dr. Lenord FellersBaxley didn't mention her husband's medications and therefore, she shouldn't take her husband's medications.  She should stick to Dr. Blenda BridegroomKramer's recommendations.  Patient verbalized understanding of this conversation.

## 2016-09-14 NOTE — Telephone Encounter (Signed)
Carotid Doppler report, no significant changes seen when compared to previous study 01/27/2015 please send patient a copy

## 2016-09-20 DIAGNOSIS — H2513 Age-related nuclear cataract, bilateral: Secondary | ICD-10-CM | POA: Diagnosis not present

## 2016-09-20 DIAGNOSIS — H16223 Keratoconjunctivitis sicca, not specified as Sjogren's, bilateral: Secondary | ICD-10-CM | POA: Diagnosis not present

## 2016-09-20 DIAGNOSIS — H16421 Pannus (corneal), right eye: Secondary | ICD-10-CM | POA: Diagnosis not present

## 2016-09-20 DIAGNOSIS — H353111 Nonexudative age-related macular degeneration, right eye, early dry stage: Secondary | ICD-10-CM | POA: Diagnosis not present

## 2016-09-21 ENCOUNTER — Other Ambulatory Visit: Payer: Self-pay | Admitting: Internal Medicine

## 2016-09-21 NOTE — Telephone Encounter (Signed)
Refill x 6 months 

## 2016-10-25 ENCOUNTER — Other Ambulatory Visit: Payer: Self-pay | Admitting: Internal Medicine

## 2016-10-25 DIAGNOSIS — Z1231 Encounter for screening mammogram for malignant neoplasm of breast: Secondary | ICD-10-CM

## 2016-11-30 ENCOUNTER — Ambulatory Visit
Admission: RE | Admit: 2016-11-30 | Discharge: 2016-11-30 | Disposition: A | Discharge status: Home / Self Care | Payer: BLUE CROSS/BLUE SHIELD | Source: Ambulatory Visit | Attending: Internal Medicine | Admitting: Internal Medicine

## 2016-11-30 DIAGNOSIS — Z1231 Encounter for screening mammogram for malignant neoplasm of breast: Secondary | ICD-10-CM | POA: Diagnosis not present

## 2017-02-11 ENCOUNTER — Other Ambulatory Visit: Payer: Self-pay | Admitting: Internal Medicine

## 2017-02-20 ENCOUNTER — Other Ambulatory Visit: Payer: BLUE CROSS/BLUE SHIELD | Admitting: Internal Medicine

## 2017-02-20 DIAGNOSIS — I10 Essential (primary) hypertension: Secondary | ICD-10-CM | POA: Diagnosis not present

## 2017-02-20 DIAGNOSIS — E785 Hyperlipidemia, unspecified: Secondary | ICD-10-CM

## 2017-02-21 LAB — HEPATIC FUNCTION PANEL
ALBUMIN: 4.3 g/dL (ref 3.6–5.1)
ALK PHOS: 114 U/L (ref 33–130)
ALT: 16 U/L (ref 6–29)
AST: 22 U/L (ref 10–35)
Bilirubin, Direct: 0.1 mg/dL (ref <=0.2)
Indirect Bilirubin: 0.2 mg/dL (ref 0.2–1.2)
TOTAL PROTEIN: 6.8 g/dL (ref 6.1–8.1)
Total Bilirubin: 0.3 mg/dL (ref 0.2–1.2)

## 2017-02-21 LAB — LIPID PANEL
Cholesterol: 155 mg/dL (ref <200)
HDL: 55 mg/dL (ref >50)
LDL Cholesterol: 86 mg/dL (ref <100)
TRIGLYCERIDES: 69 mg/dL (ref <150)
Total CHOL/HDL Ratio: 2.8 Ratio (ref <5.0)
VLDL: 14 mg/dL (ref <30)

## 2017-02-23 ENCOUNTER — Encounter: Payer: Self-pay | Admitting: Internal Medicine

## 2017-02-23 ENCOUNTER — Ambulatory Visit (INDEPENDENT_AMBULATORY_CARE_PROVIDER_SITE_OTHER): Payer: BLUE CROSS/BLUE SHIELD | Admitting: Internal Medicine

## 2017-02-23 VITALS — BP 112/72 | HR 76 | Temp 97.7°F | Wt 162.0 lb

## 2017-02-23 DIAGNOSIS — F411 Generalized anxiety disorder: Secondary | ICD-10-CM | POA: Diagnosis not present

## 2017-02-23 DIAGNOSIS — E784 Other hyperlipidemia: Secondary | ICD-10-CM

## 2017-02-23 DIAGNOSIS — I10 Essential (primary) hypertension: Secondary | ICD-10-CM

## 2017-02-23 DIAGNOSIS — E7849 Other hyperlipidemia: Secondary | ICD-10-CM

## 2017-02-23 DIAGNOSIS — Z8669 Personal history of other diseases of the nervous system and sense organs: Secondary | ICD-10-CM

## 2017-02-23 MED ORDER — BUTALBITAL-APAP-CAFFEINE 50-325-40 MG PO TABS: ORAL_TABLET | ORAL | 1 refills | Status: DC

## 2017-02-23 NOTE — Progress Notes (Signed)
   Subjective:    Patient ID: Tracie Roach, female    DOB: Oct 17, 1955, 61 y.o.   MRN: [id number]  HPI 61 year old Female in today for six-month recheck. History of hyperlipidemia, migraine headaches, essential hypertension, generalized anxiety disorder and remote history of ischemic right thalamic stroke in 2014 with persistent left upper extremity numbness and tightness which is treated with Flexeril. She asked for refill on hydrocodone/APAP today and I told her it did not want to continue that as we could only give a 5 day supply. She does have butalbital APAP for headache pain. I've recommended Aleve at least once daily. She does have some musculoskeletal pain in hips and shoulders and hands at times.  Her lipid panel and liver functions are entirely within normal limits on statin medication  She has a history of essential tremor and constipation.    Review of Systems     Objective:   Physical Exam Neck is supple without JVD thyromegaly or carotid bruits. Chest clear. Cardiac exam regular rate and rhythm. Extremities without edema.       Assessment & Plan:  History of right thalamic stroke 2014 with persistent left upper extremity paresthesias  Hyperlipidemia-lipid panel liver functions normal on statin medication  Migraine headaches-butalbital APAP  Essential hypertension-stable  Anxiety-treated sparingly with Xanax  Musculoskeletal pain-to try 1 Aleve daily  Plan: Continue same medications and return in 6 months for physical exam. She will get high-dose flu vaccine at pharmacy. Order given for zoster vaccine. Referral made for colonoscopy.

## 2017-02-23 NOTE — Patient Instructions (Addendum)
Lipid panel and liver functions are normal. Continue same medications and return in 6 months for physical examination. Flu vaccine declined. Order given for shingles vaccine at her request. Referral made for colonoscopy.

## 2017-04-12 ENCOUNTER — Other Ambulatory Visit: Payer: Self-pay | Admitting: Internal Medicine

## 2017-04-12 NOTE — Telephone Encounter (Signed)
REfill x 6 months 

## 2017-04-19 ENCOUNTER — Ambulatory Visit (AMBULATORY_SURGERY_CENTER): Payer: Self-pay | Admitting: *Deleted

## 2017-04-19 VITALS — Ht 65.5 in | Wt 165.0 lb

## 2017-04-19 DIAGNOSIS — Z1211 Encounter for screening for malignant neoplasm of colon: Secondary | ICD-10-CM

## 2017-04-19 NOTE — Progress Notes (Signed)
No egg or soy allergy known to patient  No issues with past sedation with any surgeries  or procedures, no intubation problems  No diet pills per patient No home 02 use per patient  No blood thinners per patient  Pt denies issues with constipation  No A fib or A flutter  EMMI video sent to pt's e mail pt declined   

## 2017-04-25 ENCOUNTER — Encounter: Payer: Self-pay | Admitting: Internal Medicine

## 2017-05-03 ENCOUNTER — Encounter: Payer: Self-pay | Admitting: Internal Medicine

## 2017-05-03 ENCOUNTER — Ambulatory Visit (AMBULATORY_SURGERY_CENTER): Payer: BLUE CROSS/BLUE SHIELD | Admitting: Internal Medicine

## 2017-05-03 ENCOUNTER — Other Ambulatory Visit: Payer: Self-pay

## 2017-05-03 VITALS — BP 131/59 | HR 81 | Temp 97.3°F | Resp 15 | Ht 65.5 in | Wt 165.0 lb

## 2017-05-03 DIAGNOSIS — Z1212 Encounter for screening for malignant neoplasm of rectum: Secondary | ICD-10-CM

## 2017-05-03 DIAGNOSIS — Z1211 Encounter for screening for malignant neoplasm of colon: Secondary | ICD-10-CM

## 2017-05-03 MED ORDER — SODIUM CHLORIDE 0.9 % IV SOLN: 500.0000 mL | INTRAVENOUS | Status: DC

## 2017-05-03 NOTE — Progress Notes (Signed)
To PACU, VSS. Report to [email or web address] 

## 2017-05-03 NOTE — Progress Notes (Signed)
Pt's states no medical or surgical changes since previsit or office visit. 

## 2017-05-03 NOTE — Op Note (Signed)
[facility] Patient Name: Tracie Roach Procedure Date: 05/03/2017 9:49 AM MRN: [id number] Endoscopist: Iva Booparl E Gessner , MD Age: 89 Referring MD:  Date of Birth: 1955-11-08 Gender: Female Account #: [id number] Procedure:                Colonoscopy Indications:              Screening for colorectal malignant neoplasm, This                            is the patient's first colonoscopy Medicines:                Propofol per Anesthesia, Monitored Anesthesia Care Procedure:                Pre-Anesthesia Assessment:                           - Prior to the procedure, a History and Physical                            was performed, and patient medications and                            allergies were reviewed. The patient's tolerance of                            previous anesthesia was also reviewed. The risks                            and benefits of the procedure and the sedation                            options and risks were discussed with the patient.                            All questions were answered, and informed consent                            was obtained. Prior Anticoagulants: The patient has                            taken no previous anticoagulant or antiplatelet                            agents. ASA Grade Assessment: II - A patient with                            mild systemic disease. After reviewing the risks                            and benefits, the patient was deemed in                            satisfactory condition to undergo the procedure.  After obtaining informed consent, the colonoscope                            was passed under direct vision. Throughout the                            procedure, the patient's blood pressure, pulse, and                            oxygen saturations were monitored continuously. The                            Model CF-HQ190L [id number]) scope was introduced   through the anus and advanced to the the cecum,                            identified by appendiceal orifice and ileocecal                            valve. The colonoscopy was technically difficult                            and complex due to significant looping. Successful                            completion of the procedure was aided by using                            manual pressure. The patient tolerated the                            procedure well. The quality of the bowel                            preparation was good. The bowel preparation used                            was Miralax. The ileocecal valve, appendiceal                            orifice, and rectum were photographed. Scope In: 9:51:36 AM Scope Out: 10:09:11 AM Scope Withdrawal Time: 0 hours 8 minutes 48 seconds  Total Procedure Duration: 0 hours 17 minutes 35 seconds  Findings:                 The perianal and digital rectal examinations were                            normal.                           The entire examined colon appeared normal on direct                            and retroflexion views. Complications:  No immediate complications. Estimated Blood Loss:     Estimated blood loss: none. Impression:               - The entire examined colon is normal on direct and                            retroflexion views.                           - No specimens collected. Recommendation:           - Patient has a contact number available for                            emergencies. The signs and symptoms of potential                            delayed complications were discussed with the                            patient. Return to normal activities tomorrow.                            Written discharge instructions were provided to the                            patient.                           - Resume previous diet.                           - Continue present medications.                            - Repeat colonoscopy/other appropriate test in 10                            years for screening purposes. Iva Boop, MD 05/03/2017 10:13:23 AM This report has been signed electronically.

## 2017-05-03 NOTE — Patient Instructions (Addendum)
   The colonoscopy was normal.  Next routine colonoscopy or other screening test in 10 years - 2028  I appreciate the opportunity to care for you. Iva Booparl E. Gessner, MD, FACG   YOU HAD AN ENDOSCOPIC PROCEDURE TODAY AT THE [facility]:   Refer to the procedure report that was given to you for any specific questions about what was found during the examination.  If the procedure report does not answer your questions, please call your gastroenterologist to clarify.  If you requested that your care partner not be given the details of your procedure findings, then the procedure report has been included in a sealed envelope for you to review at your convenience later.  YOU SHOULD EXPECT: Some feelings of bloating in the abdomen. Passage of more gas than usual.  Walking can help get rid of the air that was put into your GI tract during the procedure and reduce the bloating. If you had a lower endoscopy (such as a colonoscopy or flexible sigmoidoscopy) you may notice spotting of blood in your stool or on the toilet paper. If you underwent a bowel prep for your procedure, you may not have a normal bowel movement for a few days.  Please Note:  You might notice some irritation and congestion in your nose or some drainage.  This is from the oxygen used during your procedure.  There is no need for concern and it should clear up in a day or so.  SYMPTOMS TO REPORT IMMEDIATELY:   Following lower endoscopy (colonoscopy or flexible sigmoidoscopy):  Excessive amounts of blood in the stool  Significant tenderness or worsening of abdominal pains  Swelling of the abdomen that is new, acute  Fever of 100F or higher  For urgent or emergent issues, a gastroenterologist can be reached at any hour by calling [phone].   DIET:  We do recommend a small meal at first, but then you may proceed to your regular diet.  Drink plenty of fluids but you should avoid alcoholic beverages for 24  hours.  ACTIVITY:  You should plan to take it easy for the rest of today and you should NOT DRIVE or use heavy machinery until tomorrow (because of the sedation medicines used during the test).    FOLLOW UP: Our staff will call the number listed on your records the next business day following your procedure to check on you and address any questions or concerns that you may have regarding the information given to you following your procedure. If we do not reach you, we will leave a message.  However, if you are feeling well and you are not experiencing any problems, there is no need to return our call.  We will assume that you have returned to your regular daily activities without incident.  If any biopsies were taken you will be contacted by phone or by letter within the next 1-3 weeks.  Please call us at [phone] if you have not heard about the biopsies in 3 weeks.    SIGNATURES/CONFIDENTIALITY: You and/or your care partner have signed paperwork which will be entered into your electronic medical record.  These signatures attest to the fact that that the information above on your After Visit Summary has been reviewed and is understood.  Full responsibility of the confidentiality of this discharge information lies with you and/or your care-partner.  Thank you for letting us take care of your healthcare needs today.

## 2017-05-04 ENCOUNTER — Telehealth: Payer: Self-pay

## 2017-05-04 NOTE — Telephone Encounter (Signed)
  Follow up Call-  Call back number 05/03/2017  Post procedure Call Back phone  # [phone]//[phone]  Permission to leave phone message Yes  Some recent data might be hidden     Patient questions:  Do you have a fever, pain , or abdominal swelling? No. Pain Score  0 *  Have you tolerated food without any problems? Yes.    Have you been able to return to your normal activities? Yes.    Do you have any questions about your discharge instructions: Diet   No. Medications  No. Follow up visit  No.  Do you have questions or concerns about your Care? No.  Actions: * If pain score is 4 or above: No action needed, pain <4.

## 2017-05-14 ENCOUNTER — Other Ambulatory Visit: Payer: Self-pay | Admitting: Internal Medicine

## 2017-07-24 ENCOUNTER — Other Ambulatory Visit: Payer: Self-pay

## 2017-07-24 DIAGNOSIS — Z13220 Encounter for screening for lipoid disorders: Secondary | ICD-10-CM

## 2017-07-24 DIAGNOSIS — Z1321 Encounter for screening for nutritional disorder: Secondary | ICD-10-CM

## 2017-07-24 DIAGNOSIS — Z0000 Encounter for general adult medical examination without abnormal findings: Secondary | ICD-10-CM

## 2017-07-24 DIAGNOSIS — Z1329 Encounter for screening for other suspected endocrine disorder: Secondary | ICD-10-CM

## 2017-08-11 ENCOUNTER — Other Ambulatory Visit: Payer: Self-pay | Admitting: Internal Medicine

## 2017-08-20 ENCOUNTER — Other Ambulatory Visit: Payer: BLUE CROSS/BLUE SHIELD | Admitting: Internal Medicine

## 2017-08-20 DIAGNOSIS — Z1321 Encounter for screening for nutritional disorder: Secondary | ICD-10-CM | POA: Diagnosis not present

## 2017-08-20 DIAGNOSIS — Z13220 Encounter for screening for lipoid disorders: Secondary | ICD-10-CM

## 2017-08-20 DIAGNOSIS — Z0000 Encounter for general adult medical examination without abnormal findings: Secondary | ICD-10-CM

## 2017-08-20 DIAGNOSIS — Z1329 Encounter for screening for other suspected endocrine disorder: Secondary | ICD-10-CM | POA: Diagnosis not present

## 2017-08-21 LAB — LIPID PANEL
CHOL/HDL RATIO: 3.1 (calc) (ref <5.0)
Cholesterol: 173 mg/dL (ref <200)
HDL: 56 mg/dL (ref >50)
LDL Cholesterol (Calc): 100 mg/dL (calc) — ABNORMAL HIGH
NON-HDL CHOLESTEROL (CALC): 117 mg/dL (ref <130)
Triglycerides: 84 mg/dL (ref <150)

## 2017-08-21 LAB — CBC WITH DIFFERENTIAL/PLATELET
BASOS ABS: 40 {cells}/uL (ref 0–200)
BASOS PCT: 0.6 %
EOS ABS: 509 {cells}/uL — AB (ref 15–500)
Eosinophils Relative: 7.6 %
HEMATOCRIT: 40.6 % (ref 35.0–45.0)
Hemoglobin: 13.8 g/dL (ref 11.7–15.5)
LYMPHS ABS: 2191 {cells}/uL (ref 850–3900)
MCH: 30.3 pg (ref 27.0–33.0)
MCHC: 34 g/dL (ref 32.0–36.0)
MCV: 89.2 fL (ref 80.0–100.0)
MONOS PCT: 7 %
MPV: 10.9 fL (ref 7.5–12.5)
NEUTROS ABS: 3491 {cells}/uL (ref 1500–7800)
Neutrophils Relative %: 52.1 %
Platelets: 305 10*3/uL (ref 140–400)
RBC: 4.55 10*6/uL (ref 3.80–5.10)
RDW: 12 % (ref 11.0–15.0)
Total Lymphocyte: 32.7 %
WBC: 6.7 10*3/uL (ref 3.8–10.8)
WBCMIX: 469 {cells}/uL (ref 200–950)

## 2017-08-21 LAB — COMPLETE METABOLIC PANEL WITH GFR
AG Ratio: 1.7 (calc) (ref 1.0–2.5)
ALT: 16 U/L (ref 6–29)
AST: 20 U/L (ref 10–35)
Albumin: 4.3 g/dL (ref 3.6–5.1)
Alkaline phosphatase (APISO): 116 U/L (ref 33–130)
BUN: 15 mg/dL (ref 7–25)
CALCIUM: 9.9 mg/dL (ref 8.6–10.4)
CO2: 31 mmol/L (ref 20–32)
CREATININE: 0.85 mg/dL (ref 0.50–0.99)
Chloride: 105 mmol/L (ref 98–110)
GFR, EST AFRICAN AMERICAN: 86 mL/min/{1.73_m2} (ref >=60)
GFR, EST NON AFRICAN AMERICAN: 74 mL/min/{1.73_m2} (ref >=60)
GLUCOSE: 101 mg/dL — AB (ref 65–99)
Globulin: 2.5 g/dL (calc) (ref 1.9–3.7)
POTASSIUM: 4.8 mmol/L (ref 3.5–5.3)
Sodium: 144 mmol/L (ref 135–146)
TOTAL PROTEIN: 6.8 g/dL (ref 6.1–8.1)
Total Bilirubin: 0.3 mg/dL (ref 0.2–1.2)

## 2017-08-21 LAB — TSH: TSH: 3.28 mIU/L (ref 0.40–4.50)

## 2017-08-21 LAB — VITAMIN D 25 HYDROXY (VIT D DEFICIENCY, FRACTURES): Vit D, 25-Hydroxy: 57 ng/mL (ref 30–100)

## 2017-08-24 ENCOUNTER — Ambulatory Visit: Payer: BLUE CROSS/BLUE SHIELD | Admitting: Internal Medicine

## 2017-08-24 ENCOUNTER — Encounter: Payer: Self-pay | Admitting: Internal Medicine

## 2017-08-24 VITALS — BP 110/70 | HR 85 | Ht 64.5 in | Wt 169.0 lb

## 2017-08-24 DIAGNOSIS — Z8719 Personal history of other diseases of the digestive system: Secondary | ICD-10-CM

## 2017-08-24 DIAGNOSIS — Z0000 Encounter for general adult medical examination without abnormal findings: Secondary | ICD-10-CM | POA: Diagnosis not present

## 2017-08-24 DIAGNOSIS — I10 Essential (primary) hypertension: Secondary | ICD-10-CM | POA: Diagnosis not present

## 2017-08-24 DIAGNOSIS — R8290 Unspecified abnormal findings in urine: Secondary | ICD-10-CM | POA: Diagnosis not present

## 2017-08-24 DIAGNOSIS — R202 Paresthesia of skin: Secondary | ICD-10-CM | POA: Diagnosis not present

## 2017-08-24 DIAGNOSIS — I6930 Unspecified sequelae of cerebral infarction: Secondary | ICD-10-CM

## 2017-08-24 DIAGNOSIS — F411 Generalized anxiety disorder: Secondary | ICD-10-CM

## 2017-08-24 DIAGNOSIS — E7849 Other hyperlipidemia: Secondary | ICD-10-CM

## 2017-08-24 DIAGNOSIS — Z8669 Personal history of other diseases of the nervous system and sense organs: Secondary | ICD-10-CM | POA: Diagnosis not present

## 2017-08-24 DIAGNOSIS — Z8673 Personal history of transient ischemic attack (TIA), and cerebral infarction without residual deficits: Secondary | ICD-10-CM

## 2017-08-24 LAB — POCT URINALYSIS DIPSTICK
Appearance: ABNORMAL
BILIRUBIN UA: NEGATIVE
Blood, UA: NEGATIVE
Glucose, UA: NEGATIVE
KETONES UA: NEGATIVE
Nitrite, UA: NEGATIVE
Odor: ABNORMAL
PROTEIN UA: NEGATIVE
Spec Grav, UA: <=1.005 — AB (ref 1.010–1.025)
Urobilinogen, UA: 0.2 E.U./dL
pH, UA: 7.5 (ref 5.0–8.0)

## 2017-08-24 MED ORDER — CYCLOBENZAPRINE HCL 10 MG PO TABS: ORAL_TABLET | ORAL | 5 refills | Status: DC

## 2017-08-24 MED ORDER — BUTALBITAL-APAP-CAFFEINE 50-325-40 MG PO TABS: ORAL_TABLET | ORAL | 1 refills | Status: DC

## 2017-08-24 NOTE — Progress Notes (Signed)
   Subjective:    Patient ID: Tracie Roach, female    DOB: 08/02/1955, 62 y.o.   MRN: [id number]  HPI  62 year old Female for health maintenance exam and evaluation of medical issues.Hx CVA and elevated LDL cholesterol. Has been released from Neurology follow-up.  History of acute ischemic right thalamic stroke in 2014 with persistent left upper extremity numbness and tightness which is treated with Flexeril and occasional hydrocodone APAP tablet.  History of hyperlipidemia, essential hypertension, anxiety.  History of constipation and essential tremor.  No known drug allergies.  Past medical history: Occasional urinary tract infection.  Appendectomy April 2011.  Family history: Father died in 2009 with pulmonary fibrosis.  Mother is still living with history of CABG and valve replacement.  One sister in good health.  No brothers.  Long-standing history of headaches.  History of vitreous detachment left eye.  She wears glasses.  Social history: Married with 2 adult children who are also married.  Several grandchildren.  Does not smoke or consume alcohol.  She quit smoking in March 2013.  She used to smoke 6 or 7 cigarettes daily.  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     Review of Systems  Constitutional: Negative.   All other systems reviewed and are negative.      Objective:   Physical Exam  Constitutional: She is oriented to person, place, and time. She appears well-developed and well-nourished. No distress.  HENT:  Head: Normocephalic and atraumatic.  Right Ear: External ear normal.  Left Ear: External ear normal.  Mouth/Throat: Oropharynx is clear and moist.  Eyes: Pupils are equal, round, and reactive to light. Conjunctivae are normal. Right eye exhibits no discharge. Left eye exhibits no discharge. No scleral icterus.  Neck: Neck supple. No JVD present. No thyromegaly present.  Cardiovascular: Normal rate, regular rhythm and normal  heart sounds.  No murmur heard. Pulmonary/Chest: No respiratory distress. She has no wheezes. She has no rales.  Breasts normal female  Abdominal: Soft. Bowel sounds are normal. She exhibits no distension and no mass. There is no tenderness. There is no rebound and no guarding.  Genitourinary:  Genitourinary Comments: Pap taken in 2018.  Bimanual normal.  Musculoskeletal: She exhibits no edema.  Lymphadenopathy:    She has no cervical adenopathy.  Neurological: She is alert and oriented to person, place, and time. She has normal reflexes. No cranial nerve deficit. Coordination normal.  Skin: Skin is warm and dry. No rash noted. She is not diaphoretic.  Psychiatric: She has a normal mood and affect. Her behavior is normal. Judgment and thought content normal.  Anxious  Vitals reviewed.         Assessment & Plan:  Post thalamic stroke in 2014 with persistent left upper extremity numbness and tightness  History of migraine headaches-under fairly good control with medication  Anxiety-treated with Xanax  Hyperlipidemia-treated with statin  Hypertension-stable on current regimen  History of left retinal detachment  Essential tremor  History of constipation-recommended MiraLAX.  May be due to Elavil and/or pain medication.  Plan: With follow-up in September for lipid panel liver functions and office visit.  Medications refilled as patient requested. Lab work reviewed and is within normal limits.  Urine culture  done because of abnormal urine dipstick shows no growth.

## 2017-08-25 LAB — URINE CULTURE
MICRO NUMBER:: 90299801
RESULT: NO GROWTH
SPECIMEN QUALITY: ADEQUATE

## 2017-08-30 ENCOUNTER — Telehealth: Payer: Self-pay | Admitting: Internal Medicine

## 2017-08-30 NOTE — Telephone Encounter (Signed)
Patient called checking on her urine culture results. She would like call when these are final and have been review.

## 2017-08-30 NOTE — Telephone Encounter (Signed)
Pt was notified of results and instructions, pt verbalized understanding.   

## 2017-09-16 NOTE — Patient Instructions (Addendum)
It was a pleasure to see you today.  Continue same medications and return in 6 months. 

## 2017-09-21 DIAGNOSIS — H16223 Keratoconjunctivitis sicca, not specified as Sjogren's, bilateral: Secondary | ICD-10-CM | POA: Diagnosis not present

## 2017-09-21 DIAGNOSIS — H43813 Vitreous degeneration, bilateral: Secondary | ICD-10-CM | POA: Diagnosis not present

## 2017-09-21 DIAGNOSIS — H2513 Age-related nuclear cataract, bilateral: Secondary | ICD-10-CM | POA: Diagnosis not present

## 2017-09-21 DIAGNOSIS — H353111 Nonexudative age-related macular degeneration, right eye, early dry stage: Secondary | ICD-10-CM | POA: Diagnosis not present

## 2017-10-29 ENCOUNTER — Other Ambulatory Visit: Payer: Self-pay | Admitting: Internal Medicine

## 2017-10-29 DIAGNOSIS — Z1231 Encounter for screening mammogram for malignant neoplasm of breast: Secondary | ICD-10-CM

## 2017-11-07 ENCOUNTER — Other Ambulatory Visit: Payer: Self-pay | Admitting: Internal Medicine

## 2017-11-08 ENCOUNTER — Other Ambulatory Visit: Payer: Self-pay | Admitting: Internal Medicine

## 2017-11-08 NOTE — Telephone Encounter (Signed)
Refill x 6 months 

## 2017-12-02 ENCOUNTER — Other Ambulatory Visit: Payer: Self-pay | Admitting: Internal Medicine

## 2017-12-03 ENCOUNTER — Ambulatory Visit
Admission: RE | Admit: 2017-12-03 | Discharge: 2017-12-03 | Disposition: A | Discharge status: Home / Self Care | Payer: BLUE CROSS/BLUE SHIELD | Source: Ambulatory Visit | Attending: Internal Medicine | Admitting: Internal Medicine

## 2017-12-03 DIAGNOSIS — Z1231 Encounter for screening mammogram for malignant neoplasm of breast: Secondary | ICD-10-CM

## 2018-02-10 ENCOUNTER — Other Ambulatory Visit: Payer: Self-pay | Admitting: Internal Medicine

## 2018-02-19 ENCOUNTER — Encounter: Payer: Self-pay | Admitting: Internal Medicine

## 2018-02-19 ENCOUNTER — Other Ambulatory Visit: Payer: Self-pay | Admitting: Internal Medicine

## 2018-02-19 DIAGNOSIS — E7849 Other hyperlipidemia: Secondary | ICD-10-CM

## 2018-02-19 DIAGNOSIS — Z5181 Encounter for therapeutic drug level monitoring: Secondary | ICD-10-CM

## 2018-02-19 DIAGNOSIS — Z79899 Other long term (current) drug therapy: Secondary | ICD-10-CM

## 2018-02-19 NOTE — Progress Notes (Signed)
Error

## 2018-02-21 ENCOUNTER — Other Ambulatory Visit: Payer: BLUE CROSS/BLUE SHIELD | Admitting: Internal Medicine

## 2018-02-21 DIAGNOSIS — Z5181 Encounter for therapeutic drug level monitoring: Secondary | ICD-10-CM | POA: Diagnosis not present

## 2018-02-21 DIAGNOSIS — E7849 Other hyperlipidemia: Secondary | ICD-10-CM | POA: Diagnosis not present

## 2018-02-21 DIAGNOSIS — Z79899 Other long term (current) drug therapy: Secondary | ICD-10-CM | POA: Diagnosis not present

## 2018-02-22 LAB — LIPID PANEL
CHOLESTEROL: 151 mg/dL (ref <200)
HDL: 50 mg/dL — AB (ref >50)
LDL Cholesterol (Calc): 83 mg/dL (calc)
Non-HDL Cholesterol (Calc): 101 mg/dL (calc) (ref <130)
TRIGLYCERIDES: 89 mg/dL (ref <150)
Total CHOL/HDL Ratio: 3 (calc) (ref <5.0)

## 2018-02-22 LAB — HEPATIC FUNCTION PANEL
AG Ratio: 1.6 (calc) (ref 1.0–2.5)
ALT: 14 U/L (ref 6–29)
AST: 19 U/L (ref 10–35)
Albumin: 4.1 g/dL (ref 3.6–5.1)
Alkaline phosphatase (APISO): 111 U/L (ref 33–130)
Bilirubin, Direct: 0.1 mg/dL (ref 0.0–0.2)
Globulin: 2.5 g/dL (calc) (ref 1.9–3.7)
Indirect Bilirubin: 0.2 mg/dL (calc) (ref 0.2–1.2)
TOTAL PROTEIN: 6.6 g/dL (ref 6.1–8.1)
Total Bilirubin: 0.3 mg/dL (ref 0.2–1.2)

## 2018-02-26 ENCOUNTER — Encounter: Payer: Self-pay | Admitting: Internal Medicine

## 2018-02-26 ENCOUNTER — Ambulatory Visit: Payer: BLUE CROSS/BLUE SHIELD | Admitting: Internal Medicine

## 2018-02-26 VITALS — BP 140/80 | HR 80 | Ht 64.5 in | Wt 159.0 lb

## 2018-02-26 DIAGNOSIS — Z8669 Personal history of other diseases of the nervous system and sense organs: Secondary | ICD-10-CM

## 2018-02-26 DIAGNOSIS — I10 Essential (primary) hypertension: Secondary | ICD-10-CM

## 2018-02-26 DIAGNOSIS — F411 Generalized anxiety disorder: Secondary | ICD-10-CM | POA: Diagnosis not present

## 2018-02-26 DIAGNOSIS — E7849 Other hyperlipidemia: Secondary | ICD-10-CM | POA: Diagnosis not present

## 2018-02-26 DIAGNOSIS — Z8673 Personal history of transient ischemic attack (TIA), and cerebral infarction without residual deficits: Secondary | ICD-10-CM

## 2018-02-26 DIAGNOSIS — I6930 Unspecified sequelae of cerebral infarction: Secondary | ICD-10-CM

## 2018-02-26 DIAGNOSIS — Z23 Encounter for immunization: Secondary | ICD-10-CM

## 2018-02-26 MED ORDER — LOSARTAN POTASSIUM 100 MG PO TABS: 100.0000 mg | ORAL_TABLET | Freq: Every day | ORAL | 3 refills | Status: DC

## 2018-02-26 MED ORDER — BUTALBITAL-APAP-CAFFEINE 50-325-40 MG PO TABS: ORAL_TABLET | ORAL | 1 refills | Status: DC

## 2018-02-26 NOTE — Progress Notes (Signed)
   Subjective:    Patient ID: Tracie Roach, female    DOB: [birth year], 62 y.o.   MRN: [id number]  HPI  62 year old Female with history of CVA with residual left arm numbness and tightness, HTN, hyperlipidemia currently on statin therapy and lipid panel is normal.  Headaches are stable worse with humid weather. Mother age 56 recently treated for CHF.  Pt has hsitory of anxiety stable on Xanax.  Bp elevated today x 2 at 680 systolically  Will increase Losartan to 100 mg daily and follow up in 4 weeks with OV and B-met.  Fioricet refilled per pt request for headaches.  Carotid Doppler due Spring 2020. We will need to order as pt has been released by [facility]. Pt does not want to go to hospital to have procedure done.Would prefer to go back to [facility] neurology if we can order it there. I am not sure how that works.    Review of Systems see above     Objective:   Physical Exam  Constitutional: She is oriented to person, place, and time. She appears well-developed and well-nourished.  HENT:  Head: Normocephalic.  Right Ear: External ear normal.  Left Ear: External ear normal.  Mouth/Throat: Oropharynx is clear and moist.  Eyes: Conjunctivae are normal.  Neck: No JVD present. No thyromegaly present.  Cardiovascular: Normal rate, regular rhythm and normal heart sounds.  No murmur heard. Pulmonary/Chest: No stridor. No respiratory distress. She has no wheezes.  Lymphadenopathy:    She has no cervical adenopathy.  Neurological: She is alert and oriented to person, place, and time.  Skin: Skin is warm and dry.  Psychiatric: She has a normal mood and affect. Her behavior is normal. Judgment and thought content normal.  Vitals reviewed.         Assessment & Plan:  Hx CVA- to have carotid Doppler Spring 2020. Order with CPE March 2020.  HTN-increase losartan to 100 mg daily and follow-up in 4 weeks with office visit blood pressure check and basic metabolic  panel  Anxiety-continue Xanax  Migraine headaches-Fioricet refilled  Hyperlipidemia-continue statin therapy.  Lipid panel liver functions entirely normal  Health maintenance-flu vaccine given.

## 2018-02-26 NOTE — Patient Instructions (Addendum)
RTC in 4 weeks for office visit blood pressure check and basic metabolic panel on increased dose of losartan.

## 2018-03-27 ENCOUNTER — Other Ambulatory Visit: Payer: Self-pay | Admitting: Internal Medicine

## 2018-03-27 DIAGNOSIS — I10 Essential (primary) hypertension: Secondary | ICD-10-CM

## 2018-04-01 ENCOUNTER — Ambulatory Visit: Payer: BLUE CROSS/BLUE SHIELD | Admitting: Internal Medicine

## 2018-04-01 ENCOUNTER — Encounter: Payer: Self-pay | Admitting: Internal Medicine

## 2018-04-01 VITALS — BP 142/80 | HR 100 | Ht 64.5 in | Wt 158.0 lb

## 2018-04-01 DIAGNOSIS — I10 Essential (primary) hypertension: Secondary | ICD-10-CM

## 2018-04-01 LAB — BASIC METABOLIC PANEL
BUN: 11 mg/dL (ref 7–25)
CHLORIDE: 105 mmol/L (ref 98–110)
CO2: 31 mmol/L (ref 20–32)
CREATININE: 0.82 mg/dL (ref 0.50–0.99)
Calcium: 9.7 mg/dL (ref 8.6–10.4)
Glucose, Bld: 90 mg/dL (ref 65–99)
Potassium: 4.8 mmol/L (ref 3.5–5.3)
Sodium: 143 mmol/L (ref 135–146)

## 2018-04-01 MED ORDER — AMLODIPINE BESYLATE 5 MG PO TABS: 5.0000 mg | ORAL_TABLET | Freq: Every day | ORAL | 0 refills | Status: DC

## 2018-04-01 NOTE — Progress Notes (Signed)
   Subjective:    Patient ID: Tracie Roach, female    DOB: March 28, 1956, 62 y.o.   MRN: [id number]  HPI At last visit a month ago, BP was elevated at 140 systolic. Increased Losartan to 100 mg daily and she is here for follow up today.  Brings in multiple blood pressure readings that vary.  Blood pressure has ranged from 118 systolic to 149 systolic.  Diastolics have ranged from 66-104.  Several occasions blood pressure has been in the 140s systolically.  Generally diastolics have been controlled.  She feels well on this medication.  I considered adding a diuretic to this but I think I will add amlodipine 5 mg daily instead.  Continues to have situational stress with mother who is been in nursing home and is now at home with some help.  Patient has anxiety disorder.      Review of Systems  no new complaints      Objective:   Physical Exam  Neck supple.  Chest clear.  Cardiac exam regular rate and rhythm.  Blood pressure rechecked was 110 systolically and 80 diastolically after resting for a few minutes      Assessment & Plan:  Labile hypertension  Plan: Continue losartan 100 mg daily and add amlodipine 5 mg daily.  Continue to monitor blood pressures at home and call if blood pressure runs less than 100 systolically or patient has symptoms of extreme fatigue.  Otherwise she will return in 4 weeks.  Basic metabolic panel drawn on increased dose of losartan

## 2018-04-01 NOTE — Patient Instructions (Addendum)
Add amlodipine 5 mg daily to Losartan 100 mg daily and RTC in 4 weeks.  Basic metabolic panel drawn on increased dose of losartan

## 2018-04-24 ENCOUNTER — Other Ambulatory Visit: Payer: Self-pay | Admitting: Internal Medicine

## 2018-05-11 ENCOUNTER — Telehealth: Payer: Self-pay | Admitting: Internal Medicine

## 2018-05-11 ENCOUNTER — Encounter: Payer: Self-pay | Admitting: Internal Medicine

## 2018-05-11 ENCOUNTER — Other Ambulatory Visit: Payer: Self-pay | Admitting: Internal Medicine

## 2018-05-11 MED ORDER — LOSARTAN POTASSIUM 100 MG PO TABS: 100.0000 mg | ORAL_TABLET | Freq: Every day | ORAL | 3 refills | Status: DC

## 2018-05-11 NOTE — Telephone Encounter (Signed)
Clarify to pharmacy pt is taking 100 mg Losartan NOT 50 mg daily at this time

## 2018-05-20 ENCOUNTER — Ambulatory Visit: Payer: BLUE CROSS/BLUE SHIELD | Admitting: Internal Medicine

## 2018-05-20 ENCOUNTER — Encounter: Payer: Self-pay | Admitting: Internal Medicine

## 2018-05-20 VITALS — BP 120/80 | HR 96 | Temp 98.1°F | Ht 64.5 in | Wt 157.0 lb

## 2018-05-20 DIAGNOSIS — R0989 Other specified symptoms and signs involving the circulatory and respiratory systems: Secondary | ICD-10-CM

## 2018-05-20 DIAGNOSIS — I10 Essential (primary) hypertension: Secondary | ICD-10-CM | POA: Diagnosis not present

## 2018-05-20 MED ORDER — NEBIVOLOL HCL 2.5 MG PO TABS: 2.5000 mg | ORAL_TABLET | Freq: Every day | ORAL | 0 refills | Status: DC

## 2018-05-20 NOTE — Patient Instructions (Addendum)
Add Bystolic 2.5 mg daily to losartan and amlodipine. RTC 3 weeks.

## 2018-05-20 NOTE — Progress Notes (Addendum)
   Subjective:    Patient ID: Tracie Roach, female    DOB: [birth year], 62 y.o.   MRN: [id number]  HPI 62 year old in today for follow-up on labile hypertension.  Currently on losartan 100 mg daily, amlodipine 5 mg daily and still having issues with lability in blood pressure.  Says if she does not sit still for 15 minutes her blood pressure tends to be elevated.  Brings in several readings with systolics ranging from 1 29-1 51.  There are multiple readings in the 140s.  Sometimes blood pressure is excellent between 108 and 126 but there seems to be a lot of lability.  She feels well with no new complaints.    Review of Systems see above-no headache or visual disturbance     Objective:   Physical Exam Not examined but spent 10 minutes speaking with her about this issue.  There is an element of anxiety with her blood pressure.  Has situational stress with mother.       Assessment & Plan:  Labile hypertension  Plan: Add Bystolic 2.5 mg daily to Norvasc 5 mg daily and losartan 100 mg daily.  If blood pressure remains labile we can add diuretic.  Follow-up December 23.

## 2018-05-22 ENCOUNTER — Other Ambulatory Visit: Payer: Self-pay

## 2018-05-22 MED ORDER — METOPROLOL SUCCINATE ER 25 MG PO TB24: 12.5000 mg | ORAL_TABLET | Freq: Every day | ORAL | 0 refills | Status: DC

## 2018-05-28 ENCOUNTER — Other Ambulatory Visit: Payer: Self-pay | Admitting: Internal Medicine

## 2018-05-29 ENCOUNTER — Other Ambulatory Visit: Payer: Self-pay | Admitting: Internal Medicine

## 2018-05-29 NOTE — Telephone Encounter (Signed)
Why is this refill appearing again?  Says it was refilled yesterday. I thought it has already been refilled. Please call pharmacy to question this request and fix it.

## 2018-05-30 NOTE — Telephone Encounter (Signed)
[facility] [place] per patient's request, [facility] out of [facility].

## 2018-06-06 ENCOUNTER — Other Ambulatory Visit: Payer: Self-pay | Admitting: Internal Medicine

## 2018-06-10 ENCOUNTER — Ambulatory Visit: Payer: BLUE CROSS/BLUE SHIELD | Admitting: Internal Medicine

## 2018-06-10 ENCOUNTER — Encounter: Payer: Self-pay | Admitting: Internal Medicine

## 2018-06-10 VITALS — BP 100/70 | HR 82 | Ht 64.5 in | Wt 157.0 lb

## 2018-06-10 DIAGNOSIS — I10 Essential (primary) hypertension: Secondary | ICD-10-CM

## 2018-06-10 DIAGNOSIS — R0989 Other specified symptoms and signs involving the circulatory and respiratory systems: Secondary | ICD-10-CM

## 2018-06-10 DIAGNOSIS — F439 Reaction to severe stress, unspecified: Secondary | ICD-10-CM

## 2018-06-10 DIAGNOSIS — F411 Generalized anxiety disorder: Secondary | ICD-10-CM | POA: Diagnosis not present

## 2018-06-10 NOTE — Progress Notes (Signed)
   Subjective:    Patient ID: Tracie Roach, female    DOB: 12/14/1955, 62 y.o.   MRN: [id number]  HPI 62 year old Female for follow up on HTN. Over the past week, BP improving.  Readings range from 134/70  To 117/77.  Feels well with no dizziness.    Review of Systems see above     Objective:   Physical Exam Neck supple.  Chest clear.  Cardiac exam regular rate and rhythm normal S1 and S2.  Extremities without edema.  Repeat blood pressure in each arm 110/70.       Assessment & Plan:  Essential hypertension  Situational stress with mother  Anxiety state due to issues with mother being ill  Plan: Continue same medications and follow-up with physical exam early March 2020.  If blood pressure remains consistently low less than 110 systolically she may discontinue amlodipine and monitor blood pressure

## 2018-06-10 NOTE — Patient Instructions (Addendum)
Continue same medications and follow-up in March.  Blood pressure remains persistently low below 110 systolically she may discontinue amlodipine and monitor blood pressure

## 2018-06-13 ENCOUNTER — Other Ambulatory Visit: Payer: Self-pay | Admitting: Internal Medicine

## 2018-06-14 ENCOUNTER — Ambulatory Visit: Payer: BLUE CROSS/BLUE SHIELD | Admitting: Internal Medicine

## 2018-06-14 ENCOUNTER — Encounter: Payer: Self-pay | Admitting: Internal Medicine

## 2018-06-14 VITALS — BP 128/88 | HR 100 | Temp 99.1°F | Ht 64.5 in | Wt 155.0 lb

## 2018-06-14 DIAGNOSIS — I10 Essential (primary) hypertension: Secondary | ICD-10-CM | POA: Diagnosis not present

## 2018-06-14 DIAGNOSIS — J0100 Acute maxillary sinusitis, unspecified: Secondary | ICD-10-CM

## 2018-06-14 MED ORDER — AZITHROMYCIN 250 MG PO TABS: ORAL_TABLET | ORAL | 0 refills | Status: DC

## 2018-06-14 MED ORDER — FLUCONAZOLE 150 MG PO TABS: 150.0000 mg | ORAL_TABLET | Freq: Once | ORAL | 0 refills | Status: AC

## 2018-06-14 NOTE — Progress Notes (Signed)
   Subjective:    Patient ID: Tracie Roach, female    DOB: Nov 01, 1955, 62 y.o.   MRN: [id number]  HPI 62 year old Female complaining of upper respiratory infection symptoms.  Has discolored nasal drainage and maxillary sinus pressure.  Has malaise and fatigue.  Symptoms began about 48 hours ago.  No myalgias fever or shaking chills.  Denies sore throat.  Has been taking over-the-counter medications to control symptoms such as Coricidin HBP.    Review of Systems see above     Objective:   Physical Exam Skin warm and dry.  Pharynx slightly injected.  TMs are slightly full bilaterally.  Neck is supple without significant adenopathy.  Chest clear to auscultation without rales or wheezing.  She sounds nasally congested when she speaks.       Assessment & Plan:  Acute maxillary sinusitis  Plan: Zithromax Z-PAK take 2 tablets day 1 followed by 1 tablet days 2 through 5.  Diflucan if needed for Candida vaginitis while on antibiotics.  Rest and drink plenty of fluids.

## 2018-06-14 NOTE — Patient Instructions (Signed)
Zithromax Z-PAK take 2 tablets day 1 followed by 1 tablet days 2 through 5.  Diflucan 150 mg tablet to take if patient develops symptoms of Candida vaginitis while on antibiotics.  Continue over-the-counter medications such as Robitussin and Coricidin HBP.  Rest and drink plenty of fluids.

## 2018-07-18 ENCOUNTER — Other Ambulatory Visit: Payer: Self-pay | Admitting: Internal Medicine

## 2018-08-13 ENCOUNTER — Other Ambulatory Visit: Payer: Self-pay | Admitting: Internal Medicine

## 2018-08-27 ENCOUNTER — Other Ambulatory Visit: Payer: BLUE CROSS/BLUE SHIELD | Admitting: Internal Medicine

## 2018-08-27 ENCOUNTER — Encounter: Payer: BLUE CROSS/BLUE SHIELD | Admitting: Internal Medicine

## 2018-08-27 DIAGNOSIS — I10 Essential (primary) hypertension: Secondary | ICD-10-CM | POA: Diagnosis not present

## 2018-08-27 DIAGNOSIS — F439 Reaction to severe stress, unspecified: Secondary | ICD-10-CM

## 2018-08-27 DIAGNOSIS — I6930 Unspecified sequelae of cerebral infarction: Secondary | ICD-10-CM | POA: Diagnosis not present

## 2018-08-27 DIAGNOSIS — E7849 Other hyperlipidemia: Secondary | ICD-10-CM

## 2018-08-27 DIAGNOSIS — F411 Generalized anxiety disorder: Secondary | ICD-10-CM

## 2018-08-27 DIAGNOSIS — E7800 Pure hypercholesterolemia, unspecified: Secondary | ICD-10-CM | POA: Diagnosis not present

## 2018-08-27 DIAGNOSIS — Z0000 Encounter for general adult medical examination without abnormal findings: Secondary | ICD-10-CM

## 2018-08-28 LAB — COMPLETE METABOLIC PANEL WITH GFR
AG Ratio: 1.6 (calc) (ref 1.0–2.5)
ALT: 16 U/L (ref 6–29)
AST: 21 U/L (ref 10–35)
Albumin: 4.2 g/dL (ref 3.6–5.1)
Alkaline phosphatase (APISO): 110 U/L (ref 37–153)
BUN: 13 mg/dL (ref 7–25)
CO2: 31 mmol/L (ref 20–32)
Calcium: 9.8 mg/dL (ref 8.6–10.4)
Chloride: 102 mmol/L (ref 98–110)
Creat: 0.82 mg/dL (ref 0.50–0.99)
GFR, Est African American: 89 mL/min/{1.73_m2} (ref >=60)
GFR, Est Non African American: 77 mL/min/{1.73_m2} (ref >=60)
Globulin: 2.6 g/dL (calc) (ref 1.9–3.7)
Glucose, Bld: 97 mg/dL (ref 65–99)
Potassium: 4.3 mmol/L (ref 3.5–5.3)
Sodium: 141 mmol/L (ref 135–146)
Total Bilirubin: 0.3 mg/dL (ref 0.2–1.2)
Total Protein: 6.8 g/dL (ref 6.1–8.1)

## 2018-08-28 LAB — CBC WITH DIFFERENTIAL/PLATELET
Absolute Monocytes: 460 cells/uL (ref 200–950)
Basophils Absolute: 63 cells/uL (ref 0–200)
Basophils Relative: 1 %
EOS PCT: 6.4 %
Eosinophils Absolute: 403 cells/uL (ref 15–500)
HCT: 39.6 % (ref 35.0–45.0)
Hemoglobin: 13.4 g/dL (ref 11.7–15.5)
Lymphs Abs: 2085 cells/uL (ref 850–3900)
MCH: 30.6 pg (ref 27.0–33.0)
MCHC: 33.8 g/dL (ref 32.0–36.0)
MCV: 90.4 fL (ref 80.0–100.0)
MPV: 10.3 fL (ref 7.5–12.5)
Monocytes Relative: 7.3 %
Neutro Abs: 3289 cells/uL (ref 1500–7800)
Neutrophils Relative %: 52.2 %
Platelets: 333 10*3/uL (ref 140–400)
RBC: 4.38 10*6/uL (ref 3.80–5.10)
RDW: 12 % (ref 11.0–15.0)
Total Lymphocyte: 33.1 %
WBC: 6.3 10*3/uL (ref 3.8–10.8)

## 2018-08-28 LAB — LIPID PANEL
Cholesterol: 167 mg/dL (ref <200)
HDL: 57 mg/dL (ref >=50)
LDL CHOLESTEROL (CALC): 93 mg/dL
Non-HDL Cholesterol (Calc): 110 mg/dL (calc) (ref <130)
Total CHOL/HDL Ratio: 2.9 (calc) (ref <5.0)
Triglycerides: 84 mg/dL (ref <150)

## 2018-08-28 LAB — TSH: TSH: 3.84 mIU/L (ref 0.40–4.50)

## 2018-08-28 LAB — VITAMIN D 25 HYDROXY (VIT D DEFICIENCY, FRACTURES): Vit D, 25-Hydroxy: 54 ng/mL (ref 30–100)

## 2018-08-29 ENCOUNTER — Encounter: Payer: BLUE CROSS/BLUE SHIELD | Admitting: Internal Medicine

## 2018-08-30 ENCOUNTER — Encounter: Payer: BLUE CROSS/BLUE SHIELD | Admitting: Internal Medicine

## 2018-09-09 ENCOUNTER — Encounter: Payer: Self-pay | Admitting: Internal Medicine

## 2018-09-09 ENCOUNTER — Ambulatory Visit: Payer: BLUE CROSS/BLUE SHIELD | Admitting: Internal Medicine

## 2018-09-09 ENCOUNTER — Other Ambulatory Visit: Payer: Self-pay

## 2018-09-09 VITALS — BP 120/80 | HR 80 | Temp 98.5°F | Ht 64.5 in | Wt 156.0 lb

## 2018-09-09 DIAGNOSIS — I6930 Unspecified sequelae of cerebral infarction: Secondary | ICD-10-CM | POA: Diagnosis not present

## 2018-09-09 DIAGNOSIS — E7800 Pure hypercholesterolemia, unspecified: Secondary | ICD-10-CM | POA: Diagnosis not present

## 2018-09-09 DIAGNOSIS — Z0000 Encounter for general adult medical examination without abnormal findings: Secondary | ICD-10-CM

## 2018-09-09 DIAGNOSIS — I10 Essential (primary) hypertension: Secondary | ICD-10-CM

## 2018-09-09 DIAGNOSIS — Z8673 Personal history of transient ischemic attack (TIA), and cerebral infarction without residual deficits: Secondary | ICD-10-CM | POA: Diagnosis not present

## 2018-09-09 DIAGNOSIS — Z8719 Personal history of other diseases of the digestive system: Secondary | ICD-10-CM

## 2018-09-09 DIAGNOSIS — R202 Paresthesia of skin: Secondary | ICD-10-CM

## 2018-09-09 DIAGNOSIS — F411 Generalized anxiety disorder: Secondary | ICD-10-CM

## 2018-09-09 LAB — POCT URINALYSIS DIPSTICK
Appearance: NEGATIVE
Bilirubin, UA: NEGATIVE
Blood, UA: NEGATIVE
Glucose, UA: NEGATIVE
Ketones, UA: NEGATIVE
LEUKOCYTES UA: NEGATIVE
Nitrite, UA: NEGATIVE
Odor: NEGATIVE
Protein, UA: NEGATIVE
SPEC GRAV UA: 1.01 (ref 1.010–1.025)
Urobilinogen, UA: 0.2 E.U./dL
pH, UA: 7 (ref 5.0–8.0)

## 2018-09-09 MED ORDER — BUTALBITAL-APAP-CAFFEINE 50-325-40 MG PO TABS: ORAL_TABLET | ORAL | 1 refills | Status: DC

## 2018-09-09 NOTE — Progress Notes (Signed)
Subjective:    Patient ID: Tracie Roach, female    DOB: [birth year], 63 y.o.   MRN: [id number]  HPI 63 year old  Female for health maintenance exam and evaluation of medical issues.  Patient suffered a right thalamic game at stroke 09-05-2012 with persistent left upper extremity numbness and tightness which is treated with Flexeril and occasional hydrocodone APAP tablet.  She has been released from neurology follow-up.  History of hyperlipidemia, essential hypertension, anxiety, migraine headaches.  History of constipation and essential tremor.  Labs reviewed and are WNL. Vaccines up to date.  Mammogram due in June.  No known drug allergies.  Past medical history: Occasional urinary tract infection appendectomy April 2011.  History of vitreous detachment of left eye.  She wears glasses.  Family history: Father died in 2007-09-06 with pulmonary fibrosis.  Mother still living with history of CABG and valve replacement.  One sister in good health.  No brothers.  Social history: Married with 2 adult children.  Several grandchildren.  Does not smoke or consume alcohol.  Quit smoking March 2013.  Used to smoke 6 or 7 cigarettes daily.  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    Review of Systems  Constitutional: Negative.   Respiratory: Negative.   Cardiovascular: Negative.   Gastrointestinal: Negative.   Neurological:       History of migraine headaches  Psychiatric/Behavioral:       Anxiety       Objective:   Physical Exam Vitals signs reviewed.  Constitutional:      General: She is not in acute distress.    Appearance: Normal appearance.  HENT:     Head: Normocephalic and atraumatic.     Right Ear: Tympanic membrane normal.     Left Ear: Tympanic membrane normal.     Nose: Nose normal.     Mouth/Throat:     Mouth: Mucous membranes are moist.     Pharynx: Oropharynx is clear.  Eyes:     General: No scleral icterus.       Right eye: No discharge.         Left eye: No discharge.     Extraocular Movements: Extraocular movements intact.     Conjunctiva/sclera: Conjunctivae normal.     Pupils: Pupils are equal, round, and reactive to light.  Neck:     Musculoskeletal: Neck supple. No neck rigidity.     Vascular: No carotid bruit.     Comments: No thyromegaly Cardiovascular:     Rate and Rhythm: Normal rate and regular rhythm.     Pulses: Normal pulses.     Heart sounds: Normal heart sounds. No murmur.  Pulmonary:     Effort: Pulmonary effort is normal. No respiratory distress.     Breath sounds: Normal breath sounds. No wheezing or rales.     Comments: Breast without masses Abdominal:     General: Bowel sounds are normal.     Palpations: Abdomen is soft. There is no mass.     Tenderness: There is no rebound.  Genitourinary:    Comments: Pap taken in 09/05/16.  Bimanual normal Musculoskeletal:     Right lower leg: No edema.     Left lower leg: No edema.  Lymphadenopathy:     Cervical: No cervical adenopathy.  Skin:    General: Skin is warm and dry.  Neurological:     General: No focal deficit present.     Mental Status: She is alert  and oriented to person, place, and time.     Cranial Nerves: No cranial nerve deficit.     Motor: No weakness.     Gait: Gait normal.  Psychiatric:        Mood and Affect: Mood normal.        Thought Content: Thought content normal.        Judgment: Judgment normal.           Assessment & Plan:  Essential hypertension stable on current regimen  Hyperlipidemia treated with statin  Anxiety treated with Xanax  History of left retinal detachment  Essential tremor  History of constipation treated with MiraLAX  History of migraine headaches under fairly good control with Fioricet  Status post thalamic stroke 2014 with persistent left upper extremity numbness and tightness.  No longer followed by neurology.  Plan: Follow-up in 6 months.  Labs reviewed including CBC, C met, TSH, vitamin  D and urine dipstick are completely normal.  I am pleased with her lab results meds refilled as requested.

## 2018-09-16 NOTE — Patient Instructions (Signed)
It was a pleasure to see you today.  Refills provided.  Return in 6 months or as needed.  No change in medication regimen.

## 2018-10-21 ENCOUNTER — Telehealth: Payer: Self-pay | Admitting: Internal Medicine

## 2018-10-21 NOTE — Telephone Encounter (Signed)
Received Fax RX request from  Ph[facility] [employer]  Medication - metoprolol tartrate (LOPRESSOR) 25 MG tablet   Last Refill - 3.16.20  Last OV - 3.20.20  Last CPE - 3.20.20

## 2018-10-22 MED ORDER — METOPROLOL TARTRATE 25 MG PO TABS: 12.5000 mg | ORAL_TABLET | Freq: Two times a day (BID) | ORAL | 1 refills | Status: DC

## 2018-11-05 ENCOUNTER — Other Ambulatory Visit: Payer: Self-pay | Admitting: Internal Medicine

## 2018-11-05 DIAGNOSIS — Z1231 Encounter for screening mammogram for malignant neoplasm of breast: Secondary | ICD-10-CM

## 2018-11-22 DIAGNOSIS — H43813 Vitreous degeneration, bilateral: Secondary | ICD-10-CM | POA: Diagnosis not present

## 2018-11-22 DIAGNOSIS — H353111 Nonexudative age-related macular degeneration, right eye, early dry stage: Secondary | ICD-10-CM | POA: Diagnosis not present

## 2018-11-22 DIAGNOSIS — H16421 Pannus (corneal), right eye: Secondary | ICD-10-CM | POA: Diagnosis not present

## 2018-11-22 DIAGNOSIS — H2513 Age-related nuclear cataract, bilateral: Secondary | ICD-10-CM | POA: Diagnosis not present

## 2018-12-23 ENCOUNTER — Other Ambulatory Visit: Payer: Self-pay | Admitting: Internal Medicine

## 2018-12-23 MED ORDER — ALPRAZOLAM 0.5 MG PO TABS: ORAL_TABLET | ORAL | 3 refills | Status: DC

## 2018-12-23 NOTE — Telephone Encounter (Signed)
Received Fax RX request from  Pharmacy - [facility] [phone]  Medication - ALPRAZolam Duanne Moron) 0.5 MG tablet    Last Refill - 4.7.20  Last OV - 09-09-18  Last CPE 09-09-18

## 2018-12-26 ENCOUNTER — Ambulatory Visit
Admission: RE | Admit: 2018-12-26 | Discharge: 2018-12-26 | Disposition: A | Discharge status: Home / Self Care | Payer: BC Managed Care – PPO | Source: Ambulatory Visit | Attending: Internal Medicine | Admitting: Internal Medicine

## 2018-12-26 DIAGNOSIS — Z1231 Encounter for screening mammogram for malignant neoplasm of breast: Secondary | ICD-10-CM | POA: Diagnosis not present

## 2019-02-25 ENCOUNTER — Telehealth: Payer: Self-pay | Admitting: Internal Medicine

## 2019-02-25 MED ORDER — AMITRIPTYLINE HCL 10 MG PO TABS: ORAL_TABLET | ORAL | 0 refills | Status: DC

## 2019-02-25 NOTE — Telephone Encounter (Signed)
Received Fax RX request from  [facility]  Medication - amitriptyline (ELAVIL) 10 MG tablet   Last Refill - 11/27/18  Last OV - 09/09/18  Last CPE - 09/09/18

## 2019-03-05 ENCOUNTER — Other Ambulatory Visit: Payer: Self-pay | Admitting: Internal Medicine

## 2019-03-06 ENCOUNTER — Other Ambulatory Visit: Payer: Self-pay

## 2019-03-06 ENCOUNTER — Other Ambulatory Visit: Payer: BC Managed Care – PPO | Admitting: Internal Medicine

## 2019-03-06 DIAGNOSIS — I10 Essential (primary) hypertension: Secondary | ICD-10-CM | POA: Diagnosis not present

## 2019-03-06 DIAGNOSIS — E7800 Pure hypercholesterolemia, unspecified: Secondary | ICD-10-CM | POA: Diagnosis not present

## 2019-03-06 LAB — LIPID PANEL
Cholesterol: 161 mg/dL (ref <200)
HDL: 55 mg/dL (ref >=50)
LDL Cholesterol (Calc): 92 mg/dL (calc)
Non-HDL Cholesterol (Calc): 106 mg/dL (calc) (ref <130)
Total CHOL/HDL Ratio: 2.9 (calc) (ref <5.0)
Triglycerides: 59 mg/dL (ref <150)

## 2019-03-06 LAB — BASIC METABOLIC PANEL
BUN: 15 mg/dL (ref 7–25)
CO2: 32 mmol/L (ref 20–32)
Calcium: 9.9 mg/dL (ref 8.6–10.4)
Chloride: 103 mmol/L (ref 98–110)
Creat: 0.93 mg/dL (ref 0.50–0.99)
Glucose, Bld: 97 mg/dL (ref 65–99)
Potassium: 5.1 mmol/L (ref 3.5–5.3)
Sodium: 142 mmol/L (ref 135–146)

## 2019-03-10 ENCOUNTER — Telehealth: Payer: Self-pay | Admitting: Internal Medicine

## 2019-03-10 ENCOUNTER — Encounter: Payer: Self-pay | Admitting: Internal Medicine

## 2019-03-10 ENCOUNTER — Other Ambulatory Visit: Payer: Self-pay

## 2019-03-10 ENCOUNTER — Ambulatory Visit: Payer: BC Managed Care – PPO | Admitting: Internal Medicine

## 2019-03-10 DIAGNOSIS — R200 Anesthesia of skin: Secondary | ICD-10-CM

## 2019-03-10 DIAGNOSIS — E785 Hyperlipidemia, unspecified: Secondary | ICD-10-CM | POA: Diagnosis not present

## 2019-03-10 DIAGNOSIS — I69398 Other sequelae of cerebral infarction: Secondary | ICD-10-CM

## 2019-03-10 DIAGNOSIS — F419 Anxiety disorder, unspecified: Secondary | ICD-10-CM

## 2019-03-10 DIAGNOSIS — Z23 Encounter for immunization: Secondary | ICD-10-CM | POA: Diagnosis not present

## 2019-03-10 DIAGNOSIS — I10 Essential (primary) hypertension: Secondary | ICD-10-CM

## 2019-03-10 DIAGNOSIS — M1990 Unspecified osteoarthritis, unspecified site: Secondary | ICD-10-CM

## 2019-03-10 DIAGNOSIS — R208 Other disturbances of skin sensation: Secondary | ICD-10-CM

## 2019-03-10 NOTE — Progress Notes (Signed)
   Subjective:    Patient ID: Tracie Roach, female    DOB: February 09, 1956, 63 y.o.   MRN: [id number]  HPI 63 year old Female for  6 month recheck. Lipid panel and B-met are reviewed and are normal. BP stable on current regimen.  History of hyperlipidemia, essential hypertension, anxiety and migraine headaches.  Refills are up-to-date.  [place].  Expecting new granddaughter in December.  History of right thalamic stroke 2014 with persistent left upper extremity numbness and tightness which is treated with Flexeril and occasional hydrocodone/APAP tablets.  No longer followed by neurology.  Flu vaccine given today.  No known drug allergies.  Migraines are stable  .Meds reviewed. Continue Xanax for anxiety, Fioricet for migraines. Elavil for migraine prevention and treatment of chronic tightness in arm s/p CVA BP stable on Amlodipine, losartan and metoprolol. Is on Zocor s/p stroke. Has Flexeril for musculoskeletal pain.    Review of Systems no new complaints except arthritis in hands and a few other joints that is manageable without medication     Objective:   Physical Exam BP 120/80, pulse 80 and regular.  Weight 155 pounds.  BMI 26.19.  Skin warm and dry.  Nodes none.  Neck is supple without JVD thyromegaly or carotid bruits.  Chest is clear to auscultation without rales or wheezing.  Cardiac exam regular rate and rhythm normal S1 and S2 without murmurs or gallops.  Neuro no focal deficits on brief neurological exam.  Affect is normal.  Seems less anxious       Assessment & Plan:   Essential HTN- stable on current 3 drug regimen regimen.  Basic metabolic panel is normal.  History of thalamic stroke in 2014.  Persistent left upper extremity numbness and tightness  Hyperlipidemia-maintained on statin therapy status post stroke and lipid panel is normal.  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 Follow up in 6 months for CPE and fasting labs.

## 2019-03-10 NOTE — Telephone Encounter (Signed)
Pt called and wasn't sure if she had let Dr Renold Genta know but that her 1st Shingles shot was  02/26/17 [facility], and 2nd Shingles shot was 05/31/17 [facility]

## 2019-03-10 NOTE — Telephone Encounter (Signed)
Please put in dates for these Shingrix vaccines

## 2019-03-10 NOTE — Patient Instructions (Addendum)
Patient received a flu vaccine IM L deltoid, AV, CMA.  It was a pleasure to see you today.  Continue current medications as previously prescribed.  Lipid panel and basic metabolic panel are normal.  Follow-up in 6 months at which time you will be due for physical examination and fasting labs.

## 2019-05-19 ENCOUNTER — Other Ambulatory Visit: Payer: Self-pay

## 2019-05-19 MED ORDER — LOSARTAN POTASSIUM 100 MG PO TABS: 100.0000 mg | ORAL_TABLET | Freq: Every day | ORAL | 3 refills | Status: DC

## 2019-05-24 ENCOUNTER — Encounter (HOSPITAL_COMMUNITY): Payer: Self-pay | Admitting: Emergency Medicine

## 2019-05-24 ENCOUNTER — Other Ambulatory Visit: Payer: Self-pay

## 2019-05-24 ENCOUNTER — Ambulatory Visit (HOSPITAL_COMMUNITY)
Admission: EM | Admit: 2019-05-24 | Discharge: 2019-05-24 | Disposition: A | Discharge status: Home / Self Care | Payer: BC Managed Care – PPO | Attending: Family Medicine | Admitting: Family Medicine

## 2019-05-24 DIAGNOSIS — Z20828 Contact with and (suspected) exposure to other viral communicable diseases: Secondary | ICD-10-CM | POA: Diagnosis not present

## 2019-05-24 DIAGNOSIS — Z20822 Contact with and (suspected) exposure to covid-19: Secondary | ICD-10-CM

## 2019-05-24 NOTE — ED Provider Notes (Addendum)
[place]    CSN: [id number] Arrival date & time: 05/24/19  1056      History   Chief Complaint Chief Complaint  Patient presents with  . Labs Only    HPI Tracie Roach is a 63 y.o. female.   She is presenting with concern with possible Covid infection.  She lives with her mother who had a fever last night.  She has not been anywhere that she has not been.  She is currently asymptomatic.  HPI  Past Medical History:  Diagnosis Date  . Allergy   . Anxiety   . Arthritis   . Cataract    beginnings of   . Essential tremor   . Functional constipation   . Heart murmur   . Hyperlipidemia   . Hypertension   . Stroke [facility]) 2014    Patient Active Problem List   Diagnosis Date Noted  . History of ischemic vertebrobasilar artery thalamic stroke 08/16/2015  . Decreased libido 08/16/2015  . Paresthesia of left arm 08/16/2015  . Essential hypertension, benign 10/09/2013  . Disturbance of skin sensation 02/03/2013  . Generalized anxiety disorder 08/20/2012  . Hyperlipidemia 06/29/2012  . History of migraine headaches 08/15/2011  . Functional constipation   . Essential tremor     Past Surgical History:  Procedure Laterality Date  . APPENDECTOMY    . NSVD     had sedation with 1st preg  . Posterior vitreous detachment Left 08/13/15   benign  . UPPER GASTROINTESTINAL ENDOSCOPY      OB History   No obstetric history on file.      Home Medications    Prior to Admission medications   Medication Sig Start Date End Date Taking? Authorizing Provider  ALPRAZolam (XANAX) 0.25 MG tablet TAKE 1 TABLET BY MOUTH AT BEDTIME AS NEEDED FOR SLEEP 05/28/18  Yes Baxley, Cresenciano Lick, MD  amitriptyline (ELAVIL) 10 MG tablet TAKE 2 TABLETS AT BEDTIME FOR MIGRAINE PROPHYLAXIS 02/25/19  Yes Elby Showers, MD  amLODipine (NORVASC) 5 MG tablet TAKE 1 TABLET BY MOUTH EVERY DAY 03/05/19  Yes Elby Showers, MD  aspirin EC 325 MG EC tablet Take 1 tablet (325 mg total) by mouth daily.  06/30/12  Yes Samuella Cota, MD  B Complex-C (B-COMPLEX WITH VITAMIN C) tablet Take 1 tablet by mouth daily. 06/30/12  Yes Samuella Cota, MD  CALCIUM PO Take by mouth.   Yes [provider]  losartan (COZAAR) 100 MG tablet Take 1 tablet (100 mg total) by mouth daily. 05/19/19  Yes Baxley, Cresenciano Lick, MD  magnesium gluconate (MAGONATE) 500 MG tablet Take 1 tablet (500 mg total) by mouth daily. 06/30/12  Yes Samuella Cota, MD  metoprolol tartrate (LOPRESSOR) 25 MG tablet TAKE 1/2 TABLET TWICE A DAY 03/05/19  Yes Baxley, Cresenciano Lick, MD  Multiple Vitamins-Minerals (MULTIVITAMIN WITH MINERALS) tablet Take 1 tablet by mouth daily.   Yes [provider]  simvastatin (ZOCOR) 20 MG tablet TAKE 1 TABLET (20 MG TOTAL) BY MOUTH DAILY. 06/06/18  Yes Baxley, Cresenciano Lick, MD  acetaminophen (TYLENOL) 500 MG tablet Take 500 mg by mouth every 6 (six) hours as needed.    [provider]  ALPRAZolam Duanne Moron) 0.5 MG tablet TAKE 1/2 TABLET AT BEDTIME AS NEEDED FOR SLEEP 12/23/18   Elby Showers, MD  butalbital-acetaminophen-caffeine (FIORICET) 50-325-40 MG tablet TAKE 1 OR 2 TABLETS BY MOUTH EVERY 12 HOURS AS NEEDED FOR HEADACHES 03/05/19   Baxley, Cresenciano Lick, MD  cyclobenzaprine (FLEXERIL) 10 MG tablet TAKE 1/2-1 TABLET AT BEDTIME AS NEEDED 08/13/18   Margaree Mackintosh, MD  Docusate Calcium (STOOL SOFTENER PO) Take 1 tablet by mouth daily.    [provider]  fexofenadine (ALLEGRA) 180 MG tablet Take 180 mg by mouth as needed for allergies or rhinitis.    [provider]  ketoconazole (NIZORAL) 2 % shampoo Apply topically 2 (two) times a week. 02/07/13   Margaree Mackintosh, MD  Multiple Vitamins-Minerals (EYE VITAMINS) CAPS Take by mouth.    [provider]  naproxen sodium (ALEVE) 220 MG tablet Take 220 mg by mouth 2 (two) times daily as needed.    [provider]    Family History Family History  Problem Relation Age of Onset  . Heart disease Mother   . Colon cancer  Neg Hx   . Colon polyps Neg Hx   . Esophageal cancer Neg Hx   . Rectal cancer Neg Hx   . Stomach cancer Neg Hx     Social History Social History   Tobacco Use  . Smoking status: Former Smoker    Packs/day: 0.30    Quit date: 08/27/2011    Years since quitting: 7.7  . Smokeless tobacco: Never Used  Substance Use Topics  . Alcohol use: No  . Drug use: No     Allergies   Patient has no known allergies.   Review of Systems Review of Systems  Constitutional: Negative for fever.  HENT: Negative for congestion.   Respiratory: Negative for cough.   Cardiovascular: Negative for chest pain.  Gastrointestinal: Negative for abdominal distention.  Musculoskeletal: Negative for back pain.  Skin: Negative for color change.  Neurological: Negative for weakness.     Physical Exam Triage Vital Signs ED Triage Vitals  Enc Vitals Group     BP 05/24/19 1309 (!) 148/84     Pulse Rate 05/24/19 1309 82     Resp 05/24/19 1309 18     Temp 05/24/19 1309 97.8 F (36.6 C)     Temp Source 05/24/19 1309 Oral     SpO2 05/24/19 1309 99 %     Weight --      Height --      Head Circumference --      Peak Flow --      Pain Score 05/24/19 1305 0     Pain Loc --      Pain Edu? --      Excl. in GC? --    No data found.  Updated Vital Signs BP (!) 148/84 (BP Location: Right Arm)   Pulse 82   Temp 97.8 F (36.6 C) (Oral)   Resp 18   SpO2 99%   Visual Acuity Right Eye Distance:   Left Eye Distance:   Bilateral Distance:    Right Eye Near:   Left Eye Near:    Bilateral Near:     Physical Exam Gen: NAD, alert, cooperative with exam, well-appearing ENT: normal lips, normal nasal mucosa,  Eye: normal EOM, normal conjunctiva and lids CV:  no edema, +2 pedal pulses   Resp: no accessory muscle use, non-labored,  Skin: no rashes, no areas of induration  Neuro: normal tone, normal sensation to touch Psych:  normal insight, alert and oriented MSK: normal gait, normal strength   UC  Treatments / Results  Labs (all labs ordered are listed, but only abnormal results are displayed) Labs Reviewed  NOVEL CORONAVIRUS, NAA (HOSP ORDER, SEND-OUT TO REF LAB;  TAT 18-24 HRS)    EKG   Radiology No results found.  Procedures Procedures (including critical care time)  Medications Ordered in UC Medications - No data to display  Initial Impression / Assessment and Plan / UC Course  I have reviewed the triage vital signs and the nursing notes.  Pertinent labs & imaging results that were available during my care of the patient were reviewed by me and considered in my medical decision making (see chart for details).     Ms. [name] is presenting with concern for COVID infection since she has been around her mother that recently had a fever of 100.5. She has only been around her mother. Send out testing was taken.   Final Clinical Impressions(s) / UC Diagnoses   Final diagnoses:  Encounter for laboratory testing for COVID-19 virus     Discharge Instructions     We will call with the results from today.     ED Prescriptions    None     PDMP not reviewed this encounter.   Myra RudeSchmitz, Kyvon Hu E, MD 05/24/19 1432    Myra RudeSchmitz, Rimsha Trembley E, MD 05/24/19 [phone]

## 2019-05-24 NOTE — Discharge Instructions (Signed)
We will call with the results from today  

## 2019-05-24 NOTE — ED Triage Notes (Addendum)
No symptoms.  Requesting covid test

## 2019-05-26 LAB — NOVEL CORONAVIRUS, NAA (HOSP ORDER, SEND-OUT TO REF LAB; TAT 18-24 HRS): SARS-CoV-2, NAA: NOT DETECTED

## 2019-05-27 ENCOUNTER — Telehealth: Payer: Self-pay | Admitting: Emergency Medicine

## 2019-05-27 NOTE — Telephone Encounter (Signed)
Pt informed of negative results, pt states her mother is positive, pt still needs to quarantine for 14 days from last exposure. Verbalized understanding, all questions answered.

## 2019-06-03 ENCOUNTER — Other Ambulatory Visit: Payer: Self-pay | Admitting: Internal Medicine

## 2019-06-03 ENCOUNTER — Telehealth: Payer: Self-pay | Admitting: Internal Medicine

## 2019-06-03 MED ORDER — SIMVASTATIN 20 MG PO TABS: 20.0000 mg | ORAL_TABLET | Freq: Every day | ORAL | 3 refills | Status: DC

## 2019-06-03 NOTE — Telephone Encounter (Signed)
Received Fax RX request from  Pharmacy -  [id number] - [place], [place] Phone:  [phone]  Fax:  [phone]       Medication - simvastatin (ZOCOR) 20 MG tablet    Last Refill - 02/22/19  Last OV - 09/09/18  Last CPE - 09/09/18  Next Appointment - 09/11/18

## 2019-06-27 DIAGNOSIS — Z20828 Contact with and (suspected) exposure to other viral communicable diseases: Secondary | ICD-10-CM | POA: Diagnosis not present

## 2019-08-06 ENCOUNTER — Other Ambulatory Visit: Payer: Self-pay | Admitting: Internal Medicine

## 2019-08-18 ENCOUNTER — Other Ambulatory Visit: Payer: Self-pay | Admitting: Internal Medicine

## 2019-08-18 MED ORDER — ALPRAZOLAM 0.5 MG PO TABS: ORAL_TABLET | ORAL | 1 refills | Status: DC

## 2019-08-18 NOTE — Telephone Encounter (Signed)
Received Fax RX request from  Pharmacy - [facility] [phone] - [place], [place] - [address] AT [facility] [address] (Ph: [phone]  Medication - ALPRAZolam (XANAX) 0.5 MG tablet   Last Refill - 05/08/19  Last OV - 09/09/18  Last CPE - 09/09/18  Next Appointment - 09/11/18

## 2019-08-27 ENCOUNTER — Other Ambulatory Visit: Payer: Self-pay | Admitting: Internal Medicine

## 2019-09-01 ENCOUNTER — Other Ambulatory Visit: Payer: Self-pay | Admitting: Internal Medicine

## 2019-09-08 ENCOUNTER — Other Ambulatory Visit: Payer: Self-pay

## 2019-09-08 ENCOUNTER — Other Ambulatory Visit: Payer: BC Managed Care – PPO | Admitting: Internal Medicine

## 2019-09-08 ENCOUNTER — Ambulatory Visit: Payer: BC Managed Care – PPO | Attending: Internal Medicine

## 2019-09-08 ENCOUNTER — Ambulatory Visit: Payer: BC Managed Care – PPO

## 2019-09-08 DIAGNOSIS — F411 Generalized anxiety disorder: Secondary | ICD-10-CM | POA: Diagnosis not present

## 2019-09-08 DIAGNOSIS — R202 Paresthesia of skin: Secondary | ICD-10-CM

## 2019-09-08 DIAGNOSIS — I10 Essential (primary) hypertension: Secondary | ICD-10-CM

## 2019-09-08 DIAGNOSIS — I6930 Unspecified sequelae of cerebral infarction: Secondary | ICD-10-CM

## 2019-09-08 DIAGNOSIS — Z23 Encounter for immunization: Secondary | ICD-10-CM

## 2019-09-08 DIAGNOSIS — Z8673 Personal history of transient ischemic attack (TIA), and cerebral infarction without residual deficits: Secondary | ICD-10-CM

## 2019-09-08 DIAGNOSIS — Z0000 Encounter for general adult medical examination without abnormal findings: Secondary | ICD-10-CM

## 2019-09-08 LAB — CBC WITH DIFFERENTIAL/PLATELET
Absolute Monocytes: 455 cells/uL (ref 200–950)
Basophils Absolute: 63 cells/uL (ref 0–200)
Basophils Relative: 0.9 %
Eosinophils Absolute: 294 cells/uL (ref 15–500)
Eosinophils Relative: 4.2 %
HCT: 40.6 % (ref 35.0–45.0)
Hemoglobin: 13.7 g/dL (ref 11.7–15.5)
Lymphs Abs: 2107 cells/uL (ref 850–3900)
MCH: 30.6 pg (ref 27.0–33.0)
MCHC: 33.7 g/dL (ref 32.0–36.0)
MCV: 90.8 fL (ref 80.0–100.0)
MPV: 9.9 fL (ref 7.5–12.5)
Monocytes Relative: 6.5 %
Neutro Abs: 4081 cells/uL (ref 1500–7800)
Neutrophils Relative %: 58.3 %
Platelets: 347 10*3/uL (ref 140–400)
RBC: 4.47 10*6/uL (ref 3.80–5.10)
RDW: 11.9 % (ref 11.0–15.0)
Total Lymphocyte: 30.1 %
WBC: 7 10*3/uL (ref 3.8–10.8)

## 2019-09-08 LAB — COMPLETE METABOLIC PANEL WITH GFR
AG Ratio: 1.6 (calc) (ref 1.0–2.5)
ALT: 17 U/L (ref 6–29)
AST: 20 U/L (ref 10–35)
Albumin: 4.2 g/dL (ref 3.6–5.1)
Alkaline phosphatase (APISO): 117 U/L (ref 37–153)
BUN: 14 mg/dL (ref 7–25)
CO2: 34 mmol/L — ABNORMAL HIGH (ref 20–32)
Calcium: 9.9 mg/dL (ref 8.6–10.4)
Chloride: 101 mmol/L (ref 98–110)
Creat: 0.85 mg/dL (ref 0.50–0.99)
GFR, Est African American: 85 mL/min/{1.73_m2} (ref >=60)
GFR, Est Non African American: 73 mL/min/{1.73_m2} (ref >=60)
Globulin: 2.7 g/dL (calc) (ref 1.9–3.7)
Glucose, Bld: 104 mg/dL — ABNORMAL HIGH (ref 65–99)
Potassium: 4.8 mmol/L (ref 3.5–5.3)
Sodium: 141 mmol/L (ref 135–146)
Total Bilirubin: 0.4 mg/dL (ref 0.2–1.2)
Total Protein: 6.9 g/dL (ref 6.1–8.1)

## 2019-09-08 LAB — LIPID PANEL
Cholesterol: 167 mg/dL (ref <200)
HDL: 56 mg/dL (ref >=50)
LDL Cholesterol (Calc): 94 mg/dL (calc)
Non-HDL Cholesterol (Calc): 111 mg/dL (calc) (ref <130)
Total CHOL/HDL Ratio: 3 (calc) (ref <5.0)
Triglycerides: 78 mg/dL (ref <150)

## 2019-09-08 LAB — TSH: TSH: 3.13 mIU/L (ref 0.40–4.50)

## 2019-09-08 NOTE — Progress Notes (Signed)
   Covid-19 Vaccination Clinic  Name:  Tracie Roach    MRN: [id number] DOB: Dec 21, 1955  09/08/2019  [name] was observed post Covid-19 immunization for 15 minutes without incident. She was provided with Vaccine Information Sheet and instruction to access the V-Safe system.   [name] was instructed to call 911 with any severe reactions post vaccine: Marland Kitchen Difficulty breathing  . Swelling of face and throat  . A fast heartbeat  . A bad rash all over body  . Dizziness and weakness   Immunizations Administered    Name Date Dose VIS Date Route   Pfizer COVID-19 Vaccine 09/08/2019 11:43 AM 0.3 mL 05/30/2019 Intramuscular   Manufacturer: [employer], [employer]   Lot: [id number]   NDC: [address]-0

## 2019-09-11 ENCOUNTER — Ambulatory Visit: Payer: BC Managed Care – PPO | Admitting: Internal Medicine

## 2019-09-11 ENCOUNTER — Encounter: Payer: Self-pay | Admitting: Internal Medicine

## 2019-09-11 ENCOUNTER — Other Ambulatory Visit: Payer: Self-pay

## 2019-09-11 ENCOUNTER — Other Ambulatory Visit (HOSPITAL_COMMUNITY)
Admission: RE | Admit: 2019-09-11 | Discharge: 2019-09-11 | Disposition: A | Discharge status: Home / Self Care | Payer: BC Managed Care – PPO | Source: Ambulatory Visit | Attending: Internal Medicine | Admitting: Internal Medicine

## 2019-09-11 VITALS — BP 140/70 | HR 80 | Temp 98.6°F | Ht 64.5 in | Wt 163.0 lb

## 2019-09-11 DIAGNOSIS — F411 Generalized anxiety disorder: Secondary | ICD-10-CM | POA: Diagnosis not present

## 2019-09-11 DIAGNOSIS — Z8669 Personal history of other diseases of the nervous system and sense organs: Secondary | ICD-10-CM | POA: Diagnosis not present

## 2019-09-11 DIAGNOSIS — Z124 Encounter for screening for malignant neoplasm of cervix: Secondary | ICD-10-CM | POA: Insufficient documentation

## 2019-09-11 DIAGNOSIS — F439 Reaction to severe stress, unspecified: Secondary | ICD-10-CM

## 2019-09-11 DIAGNOSIS — Z8719 Personal history of other diseases of the digestive system: Secondary | ICD-10-CM

## 2019-09-11 DIAGNOSIS — E7849 Other hyperlipidemia: Secondary | ICD-10-CM

## 2019-09-11 DIAGNOSIS — I6930 Unspecified sequelae of cerebral infarction: Secondary | ICD-10-CM

## 2019-09-11 DIAGNOSIS — I10 Essential (primary) hypertension: Secondary | ICD-10-CM | POA: Diagnosis not present

## 2019-09-11 DIAGNOSIS — R202 Paresthesia of skin: Secondary | ICD-10-CM

## 2019-09-11 DIAGNOSIS — E7800 Pure hypercholesterolemia, unspecified: Secondary | ICD-10-CM | POA: Diagnosis not present

## 2019-09-11 DIAGNOSIS — Z0000 Encounter for general adult medical examination without abnormal findings: Secondary | ICD-10-CM | POA: Insufficient documentation

## 2019-09-11 LAB — POCT URINALYSIS DIPSTICK
Appearance: NEGATIVE
Bilirubin, UA: NEGATIVE
Blood, UA: NEGATIVE
Glucose, UA: NEGATIVE
Ketones, UA: NEGATIVE
Leukocytes, UA: NEGATIVE
Nitrite, UA: NEGATIVE
Odor: NEGATIVE
Protein, UA: NEGATIVE
Spec Grav, UA: 1.01 (ref 1.010–1.025)
Urobilinogen, UA: 0.2 E.U./dL
pH, UA: 7.5 (ref 5.0–8.0)

## 2019-09-13 MED ORDER — FLUCONAZOLE 150 MG PO TABS: 150.0000 mg | ORAL_TABLET | Freq: Once | ORAL | 1 refills | Status: AC

## 2019-09-13 MED ORDER — BUTALBITAL-APAP-CAFFEINE 50-325-40 MG PO TABS: ORAL_TABLET | ORAL | 1 refills | Status: DC

## 2019-09-13 NOTE — Progress Notes (Signed)
Subjective:    Patient ID: Tracie Roach, female    DOB: 1956-06-01, 64 y.o.   MRN: [id number]  HPI 64 year old Female for health maintenance exam and evaluation of medical issues.  Patient suffered a right thalamic stroke in 15-Oct-2012 and has persistent left upper extremity numbness and tightness which is treated with Flexeril and occasional hydrocodone APAP tablet.  She has been released from neurology follow-up.  History of hyperlipidemia, essential hypertension, anxiety and migraine headaches.  History of constipation and essential tremor.  No known drug allergies.  Blood pressure stable on current regimen.  Past medical history: Occasional urinary tract infection.  Appendectomy Oct 15, 2009.  History of vitreous detachment of left eye.  She wears glasses.  Family history: Father died in 16-Oct-2007 with pulmonary fibrosis.  Mother is living but doing poorly with history of CABG and valve replacement.  1 sister in good health.  No brothers.  Social history: Married with 2 adult children.  Several grandchildren.  Does not smoke or consume alcohol.  Quit smoking March 2013.  Used to smoke 6 or 7 cigarettes daily.  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    Review of Systems history of anxiety.  Situational stress with mother discussed.  Migraine headaches stable and controlled with Fioricet.     Objective:   Physical Exam Vitals reviewed.  Constitutional:      General: She is not in acute distress.    Appearance: Normal appearance.  HENT:     Head: Normocephalic.     Right Ear: Tympanic membrane normal.     Left Ear: Tympanic membrane normal.     Nose: Nose normal.  Eyes:     General: No scleral icterus.       Right eye: No discharge.        Left eye: No discharge.     Extraocular Movements: Extraocular movements intact.     Conjunctiva/sclera: Conjunctivae normal.     Pupils: Pupils are equal, round, and reactive to light.  Neck:   Comments: No thyromegaly or carotid bruits. Cardiovascular:     Rate and Rhythm: Normal rate and regular rhythm.     Pulses: Normal pulses.     Heart sounds: Normal heart sounds. No murmur.  Pulmonary:     [place]: Pulmonary effort is normal. No respiratory distress.     Breath sounds: Normal breath sounds. No wheezing, rhonchi or rales.     Comments: Breasts without masses Abdominal:     General: Bowel sounds are normal.     Palpations: Abdomen is soft. There is no mass.     Tenderness: There is no abdominal tenderness. There is no rebound.  Genitourinary:    Comments: Pap taken.  Bimanual normal. Musculoskeletal:     Cervical back: Neck supple. No rigidity.     Right lower leg: No edema.     Left lower leg: No edema.  Lymphadenopathy:     Cervical: No cervical adenopathy.  Skin:    General: Skin is warm and dry.  Neurological:     General: No focal deficit present.     Mental Status: She is alert and oriented to person, place, and time.     Cranial Nerves: No cranial nerve deficit.     Coordination: Coordination normal.     Gait: Gait normal.  Psychiatric:        Mood and Affect: Mood normal.  Behavior: Behavior normal.        Thought Content: Thought content normal.        Judgment: Judgment normal.           Assessment & Plan:  Essential hypertension-stable on current regimen  Hyperlipidemia-lipids are normal on statin medication  Anxiety treated with Xanax  History of left retinal detachment  History of essential tremor  History of constipation treated with MiraLAX  Migraine headaches under fairly good control with Fioricet  Status post thalamic stroke 2014 with persistent left upper extremity numbness and tightness.  No longer followed by neurology  Plan: Continue current medications and follow-up in 6 months.  Has had 1 COVID-19 immunization.  Tetanus immunization update due at next visit.  Has had Zostavax vaccine.

## 2019-09-13 NOTE — Patient Instructions (Addendum)
It was a pleasure to see you today.  Pap results are pending.  Labs are stable.  Follow-up in 6 months.  Tetanus immunization update will be due at that time.

## 2019-09-15 LAB — CYTOLOGY - PAP
Comment: NEGATIVE
Diagnosis: NEGATIVE
High risk HPV: NEGATIVE

## 2019-10-01 ENCOUNTER — Ambulatory Visit: Payer: BC Managed Care – PPO | Attending: Internal Medicine

## 2019-10-01 DIAGNOSIS — Z23 Encounter for immunization: Secondary | ICD-10-CM

## 2019-10-01 NOTE — Progress Notes (Signed)
   Covid-19 Vaccination Clinic  Name:  RAVENNA LEGORE    MRN: [id number] DOB: [birth year]  10/01/2019  Ms. Monrreal was observed post Covid-19 immunization for 15 minutes without incident. She was provided with Vaccine Information Sheet and instruction to access the V-Safe system.   Ms. Wartman was instructed to call 911 with any severe reactions post vaccine: Marland Kitchen Difficulty breathing  . Swelling of face and throat  . A fast heartbeat  . A bad rash all over body  . Dizziness and weakness

## 2019-10-16 ENCOUNTER — Telehealth: Payer: Self-pay | Admitting: Internal Medicine

## 2019-10-16 NOTE — Telephone Encounter (Addendum)
Tracie Roach [phone]  Tracie Roach called to say that for about a week her hearing has been muffled, sounds like she is in tunnel, effects how she hears, head feels tight. Thinks she might have fluid in her ears. No fever or any other symptoms, No COVID exposure that she knows of. Had 2nd vaccine on 10/01/2019. She has been taken [place]

## 2019-10-16 NOTE — Telephone Encounter (Signed)
Appointment scheduled.

## 2019-10-16 NOTE — Telephone Encounter (Signed)
OV tomorrow

## 2019-10-17 ENCOUNTER — Ambulatory Visit: Payer: BC Managed Care – PPO | Admitting: Internal Medicine

## 2019-10-17 ENCOUNTER — Encounter: Payer: Self-pay | Admitting: Internal Medicine

## 2019-10-17 ENCOUNTER — Other Ambulatory Visit: Payer: Self-pay

## 2019-10-17 VITALS — BP 120/80 | HR 77 | Temp 98.7°F | Ht 64.5 in | Wt 163.0 lb

## 2019-10-17 DIAGNOSIS — J309 Allergic rhinitis, unspecified: Secondary | ICD-10-CM

## 2019-10-17 DIAGNOSIS — H6123 Impacted cerumen, bilateral: Secondary | ICD-10-CM | POA: Diagnosis not present

## 2019-10-17 DIAGNOSIS — I10 Essential (primary) hypertension: Secondary | ICD-10-CM

## 2019-10-17 MED ORDER — METHYLPREDNISOLONE ACETATE 80 MG/ML IJ SUSP
80.0000 mg | Freq: Once | INTRAMUSCULAR | Status: AC
  Administered 2019-10-17: 80 mg via INTRAMUSCULAR

## 2019-10-17 NOTE — Progress Notes (Signed)
   Subjective:    Patient ID: Tracie Roach, female    DOB: [birth year], 64 y.o.   MRN: [id number]  HPI Patient complaining of ear congestion.  Feels the ears are stopped up and that she is not hearing well.  She also stated that when she is out mowing the yard she has had itchy eyes and nasal congestion which she attributed to allergic rhinitis symptoms.  Main complaint though is that she cannot hear well.    Review of Systems she has a history of hypertension and anxiety     Objective:   Physical Exam Vital signs reviewed.  She is afebrile.  She has impacted cerumen in both ears removed with curette.  The wax was very sticky.  Slight amount of trauma to left external ear canal and Cortisporin otic suspension was instilled into the left external ear canal.  TMs appear to be normal.  She was able to hear better once this cerumen was removed however she was still complaining of allergy symptoms so we gave her an injection of Depo-Medrol 80 mg IM which may help her allergy symptoms when she is mowing the yard at the next few days       Assessment & Plan:  Impacted cerumen both ears  Allergic rhinitis  Plan: Wax removed.  If continues to have symptoms will refer to ENT physician.  Depo-Medrol 80 mg IM for allergic rhinitis symptoms.

## 2019-10-17 NOTE — Patient Instructions (Signed)
Depo-Medrol 80 mg IM.  Cerumen removed manually today.

## 2019-11-12 DIAGNOSIS — S9002XA Contusion of left ankle, initial encounter: Secondary | ICD-10-CM | POA: Diagnosis not present

## 2019-11-21 DIAGNOSIS — S9002XA Contusion of left ankle, initial encounter: Secondary | ICD-10-CM | POA: Diagnosis not present

## 2019-11-25 ENCOUNTER — Other Ambulatory Visit: Payer: Self-pay | Admitting: Internal Medicine

## 2019-11-25 DIAGNOSIS — Z1231 Encounter for screening mammogram for malignant neoplasm of breast: Secondary | ICD-10-CM

## 2019-12-08 ENCOUNTER — Other Ambulatory Visit: Payer: Self-pay | Admitting: Internal Medicine

## 2019-12-30 ENCOUNTER — Ambulatory Visit
Admission: RE | Admit: 2019-12-30 | Discharge: 2019-12-30 | Disposition: A | Discharge status: Home / Self Care | Payer: BC Managed Care – PPO | Source: Ambulatory Visit | Attending: Internal Medicine | Admitting: Internal Medicine

## 2019-12-30 ENCOUNTER — Other Ambulatory Visit: Payer: Self-pay

## 2019-12-30 DIAGNOSIS — Z1231 Encounter for screening mammogram for malignant neoplasm of breast: Secondary | ICD-10-CM | POA: Diagnosis not present

## 2020-01-26 DIAGNOSIS — S81811A Laceration without foreign body, right lower leg, initial encounter: Secondary | ICD-10-CM | POA: Diagnosis not present

## 2020-01-26 DIAGNOSIS — Z23 Encounter for immunization: Secondary | ICD-10-CM | POA: Diagnosis not present

## 2020-01-26 DIAGNOSIS — W268XXA Contact with other sharp object(s), not elsewhere classified, initial encounter: Secondary | ICD-10-CM | POA: Diagnosis not present

## 2020-01-27 DIAGNOSIS — H16421 Pannus (corneal), right eye: Secondary | ICD-10-CM | POA: Diagnosis not present

## 2020-01-27 DIAGNOSIS — H2513 Age-related nuclear cataract, bilateral: Secondary | ICD-10-CM | POA: Diagnosis not present

## 2020-01-27 DIAGNOSIS — H43813 Vitreous degeneration, bilateral: Secondary | ICD-10-CM | POA: Diagnosis not present

## 2020-01-27 DIAGNOSIS — H353111 Nonexudative age-related macular degeneration, right eye, early dry stage: Secondary | ICD-10-CM | POA: Diagnosis not present

## 2020-02-28 ENCOUNTER — Other Ambulatory Visit: Payer: Self-pay | Admitting: Internal Medicine

## 2020-03-08 ENCOUNTER — Other Ambulatory Visit: Payer: BC Managed Care – PPO | Admitting: Internal Medicine

## 2020-03-08 ENCOUNTER — Other Ambulatory Visit: Payer: Self-pay

## 2020-03-08 DIAGNOSIS — R739 Hyperglycemia, unspecified: Secondary | ICD-10-CM

## 2020-03-08 DIAGNOSIS — E7800 Pure hypercholesterolemia, unspecified: Secondary | ICD-10-CM

## 2020-03-09 LAB — LIPID PANEL
Cholesterol: 163 mg/dL (ref <200)
HDL: 59 mg/dL (ref >=50)
LDL Cholesterol (Calc): 85 mg/dL (calc)
Non-HDL Cholesterol (Calc): 104 mg/dL (calc) (ref <130)
Total CHOL/HDL Ratio: 2.8 (calc) (ref <5.0)
Triglycerides: 94 mg/dL (ref <150)

## 2020-03-09 LAB — HEPATIC FUNCTION PANEL
AG Ratio: 1.6 (calc) (ref 1.0–2.5)
ALT: 17 U/L (ref 6–29)
AST: 21 U/L (ref 10–35)
Albumin: 4.2 g/dL (ref 3.6–5.1)
Alkaline phosphatase (APISO): 109 U/L (ref 37–153)
Bilirubin, Direct: 0.1 mg/dL (ref 0.0–0.2)
Globulin: 2.7 g/dL (calc) (ref 1.9–3.7)
Indirect Bilirubin: 0.2 mg/dL (calc) (ref 0.2–1.2)
Total Bilirubin: 0.3 mg/dL (ref 0.2–1.2)
Total Protein: 6.9 g/dL (ref 6.1–8.1)

## 2020-03-09 LAB — HEMOGLOBIN A1C
Hgb A1c MFr Bld: 5.5 % of total Hgb (ref <5.7)
Mean Plasma Glucose: 111 (calc)
eAG (mmol/L): 6.2 (calc)

## 2020-03-11 ENCOUNTER — Ambulatory Visit: Payer: BC Managed Care – PPO | Admitting: Internal Medicine

## 2020-03-11 ENCOUNTER — Other Ambulatory Visit: Payer: Self-pay

## 2020-03-11 ENCOUNTER — Encounter: Payer: Self-pay | Admitting: Internal Medicine

## 2020-03-11 VITALS — BP 102/74 | HR 71 | Ht 64.5 in | Wt 166.0 lb

## 2020-03-11 DIAGNOSIS — Z23 Encounter for immunization: Secondary | ICD-10-CM

## 2020-03-11 DIAGNOSIS — I10 Essential (primary) hypertension: Secondary | ICD-10-CM | POA: Diagnosis not present

## 2020-03-11 MED ORDER — BUTALBITAL-APAP-CAFFEINE 50-325-40 MG PO TABS: ORAL_TABLET | ORAL | 0 refills | Status: DC

## 2020-03-11 MED ORDER — HYDROCODONE-ACETAMINOPHEN 5-325 MG PO TABS: 1.0000 | ORAL_TABLET | Freq: Four times a day (QID) | ORAL | 0 refills | Status: DC | PRN

## 2020-03-11 MED ORDER — FLUCONAZOLE 150 MG PO TABS: 150.0000 mg | ORAL_TABLET | Freq: Once | ORAL | 1 refills | Status: AC

## 2020-03-11 NOTE — Progress Notes (Signed)
   Subjective:    Patient ID: Tracie Roach, female    DOB: 1956-01-26, 64 y.o.   MRN: [id number]  HPI  64 year old Female for 6 month follow up.  She has a history of essential hypertension, anxiety and migraine headaches.  History of constipation and essential tremor.  History of right thalamic stroke in 2014.  Regarding migraine headaches, she takes butalbital and that will be refilled today.  She would like to have some Diflucan on hand for Candida vaginitis.  She takes Xanax at bedtime for anxiety and sleep.  Blood pressure well controlled with Norvasc, metoprolol and losartan.  History of essential tremor.  This is treated with beta-blocker.  Persistent left upper extremity numbness status post CVA 2014 treated with as needed Flexeril and sometimes hydrocodone APAP.  She has been released from neurology follow-up.    Review of Systems see above     Objective:   Physical Exam Weight 166 pounds blood pressure 102/74 pulse 71 pulse oximetry 99% weight 166 pounds BMI 28.05  Skin warm and dry.  Nodes none.  Neck is supple without JVD thyromegaly or carotid bruits.  Chest is clear to auscultation without rales or wheezing.  Cardiac exam regular rate and rhythm normal S1 and S2 without murmurs.  No lower extremity edema.  Affect is slightly anxious.  Labs reviewed include hemoglobin A1c which is normal at 5.5%  Lipid panel is completely normal on statin medication.  Liver functions are normal as well.       Assessment & Plan:  History of right thalamic CVA in 2014 with persistent left upper extremity numbness and tightness.  Takes Flexeril and as needed hydrocodone/APAP  History of migraine headaches treated with butalbital as needed  Hyperlipidemia-stable with statin  Essential hypertension stable with 2 drug regimen  Anxiety treated with Xanax  Essential tremor treated with metoprolol also prescribed for hypertension  Plan: Patient reassured labs are within normal limits.   Return in 6 months or as needed.  Flu vaccine given

## 2020-03-17 NOTE — Patient Instructions (Signed)
Continue current medications.  Return in 6 months.  Flu vaccine given.  Labs are within normal limits.

## 2020-03-25 ENCOUNTER — Other Ambulatory Visit: Payer: Self-pay | Admitting: Internal Medicine

## 2020-03-30 ENCOUNTER — Other Ambulatory Visit: Payer: Self-pay | Admitting: Internal Medicine

## 2020-04-14 ENCOUNTER — Other Ambulatory Visit: Payer: Self-pay | Admitting: Internal Medicine

## 2020-04-21 DIAGNOSIS — S7012XA Contusion of left thigh, initial encounter: Secondary | ICD-10-CM | POA: Diagnosis not present

## 2020-05-07 DIAGNOSIS — S7012XD Contusion of left thigh, subsequent encounter: Secondary | ICD-10-CM | POA: Diagnosis not present

## 2020-05-22 ENCOUNTER — Ambulatory Visit: Payer: BC Managed Care – PPO | Attending: Internal Medicine

## 2020-05-22 DIAGNOSIS — Z23 Encounter for immunization: Secondary | ICD-10-CM

## 2020-05-22 NOTE — Progress Notes (Signed)
   Covid-19 Vaccination Clinic  Name:  Tracie Roach    MRN: [id number] DOB: 11/10/1955  05/22/2020  Tracie Roach was observed post Covid-19 immunization for 15 minutes without incident. She was provided with Vaccine Information Sheet and instruction to access the V-Safe system.   Tracie Roach was instructed to call 911 with any severe reactions post vaccine: Marland Kitchen Difficulty breathing  . Swelling of face and throat  . A fast heartbeat  . A bad rash all over body  . Dizziness and weakness   Immunizations Administered    Name Date Dose VIS Date Route   Pfizer COVID-19 Vaccine 05/22/2020 10:37 AM 0.3 mL 04/07/2020 Intramuscular   Manufacturer: [employer], [employer]   Lot: [id number]   NDC: [address]-8

## 2020-05-26 ENCOUNTER — Other Ambulatory Visit: Payer: Self-pay | Admitting: Internal Medicine

## 2020-07-19 DIAGNOSIS — Z20822 Contact with and (suspected) exposure to covid-19: Secondary | ICD-10-CM | POA: Diagnosis not present

## 2020-09-01 ENCOUNTER — Other Ambulatory Visit: Payer: Self-pay | Admitting: Internal Medicine

## 2020-09-09 ENCOUNTER — Other Ambulatory Visit: Payer: BC Managed Care – PPO | Admitting: Internal Medicine

## 2020-09-09 ENCOUNTER — Other Ambulatory Visit: Payer: Self-pay

## 2020-09-09 DIAGNOSIS — F411 Generalized anxiety disorder: Secondary | ICD-10-CM

## 2020-09-09 DIAGNOSIS — I10 Essential (primary) hypertension: Secondary | ICD-10-CM

## 2020-09-09 DIAGNOSIS — E7800 Pure hypercholesterolemia, unspecified: Secondary | ICD-10-CM | POA: Diagnosis not present

## 2020-09-09 DIAGNOSIS — E7849 Other hyperlipidemia: Secondary | ICD-10-CM | POA: Diagnosis not present

## 2020-09-09 DIAGNOSIS — Z0000 Encounter for general adult medical examination without abnormal findings: Secondary | ICD-10-CM

## 2020-09-09 DIAGNOSIS — Z8669 Personal history of other diseases of the nervous system and sense organs: Secondary | ICD-10-CM

## 2020-09-09 DIAGNOSIS — F439 Reaction to severe stress, unspecified: Secondary | ICD-10-CM

## 2020-09-11 LAB — CBC WITH DIFFERENTIAL/PLATELET
Absolute Monocytes: 589 cells/uL (ref 200–950)
Basophils Absolute: 37 cells/uL (ref 0–200)
Basophils Relative: 0.6 %
Eosinophils Absolute: 279 cells/uL (ref 15–500)
Eosinophils Relative: 4.5 %
HCT: 40.3 % (ref 35.0–45.0)
Hemoglobin: 13.2 g/dL (ref 11.7–15.5)
Lymphs Abs: 1717 cells/uL (ref 850–3900)
MCH: 30.3 pg (ref 27.0–33.0)
MCHC: 32.8 g/dL (ref 32.0–36.0)
MCV: 92.6 fL (ref 80.0–100.0)
MPV: 10.4 fL (ref 7.5–12.5)
Monocytes Relative: 9.5 %
Neutro Abs: 3577 cells/uL (ref 1500–7800)
Neutrophils Relative %: 57.7 %
Platelets: 331 10*3/uL (ref 140–400)
RBC: 4.35 10*6/uL (ref 3.80–5.10)
RDW: 12.4 % (ref 11.0–15.0)
Total Lymphocyte: 27.7 %
WBC: 6.2 10*3/uL (ref 3.8–10.8)

## 2020-09-11 LAB — COMPLETE METABOLIC PANEL WITH GFR
AG Ratio: 1.5 (calc) (ref 1.0–2.5)
ALT: 17 U/L (ref 6–29)
AST: 21 U/L (ref 10–35)
Albumin: 4 g/dL (ref 3.6–5.1)
Alkaline phosphatase (APISO): 104 U/L (ref 37–153)
BUN: 14 mg/dL (ref 7–25)
CO2: 31 mmol/L (ref 20–32)
Calcium: 9.5 mg/dL (ref 8.6–10.4)
Chloride: 107 mmol/L (ref 98–110)
Creat: 0.92 mg/dL (ref 0.50–0.99)
GFR, Est African American: 76 mL/min/{1.73_m2} (ref >=60)
GFR, Est Non African American: 66 mL/min/{1.73_m2} (ref >=60)
Globulin: 2.6 g/dL (calc) (ref 1.9–3.7)
Glucose, Bld: 82 mg/dL (ref 65–99)
Potassium: 5.2 mmol/L (ref 3.5–5.3)
Sodium: 146 mmol/L (ref 135–146)
Total Bilirubin: 0.3 mg/dL (ref 0.2–1.2)
Total Protein: 6.6 g/dL (ref 6.1–8.1)

## 2020-09-11 LAB — HEMOGLOBIN A1C
Hgb A1c MFr Bld: 5.5 % of total Hgb (ref <5.7)
Mean Plasma Glucose: 111 mg/dL
eAG (mmol/L): 6.2 mmol/L

## 2020-09-11 LAB — LIPID PANEL
Cholesterol: 154 mg/dL (ref <200)
HDL: 52 mg/dL (ref >=50)
LDL Cholesterol (Calc): 85 mg/dL (calc)
Non-HDL Cholesterol (Calc): 102 mg/dL (calc) (ref <130)
Total CHOL/HDL Ratio: 3 (calc) (ref <5.0)
Triglycerides: 82 mg/dL (ref <150)

## 2020-09-11 LAB — TSH: TSH: 2.3 mIU/L (ref 0.40–4.50)

## 2020-09-13 ENCOUNTER — Ambulatory Visit (INDEPENDENT_AMBULATORY_CARE_PROVIDER_SITE_OTHER): Payer: BC Managed Care – PPO | Admitting: Internal Medicine

## 2020-09-13 ENCOUNTER — Encounter: Payer: Self-pay | Admitting: Internal Medicine

## 2020-09-13 ENCOUNTER — Other Ambulatory Visit: Payer: Self-pay

## 2020-09-13 VITALS — BP 120/70 | HR 90 | Ht 64.5 in | Wt 169.0 lb

## 2020-09-13 DIAGNOSIS — E7800 Pure hypercholesterolemia, unspecified: Secondary | ICD-10-CM | POA: Diagnosis not present

## 2020-09-13 DIAGNOSIS — Z8669 Personal history of other diseases of the nervous system and sense organs: Secondary | ICD-10-CM

## 2020-09-13 DIAGNOSIS — I6930 Unspecified sequelae of cerebral infarction: Secondary | ICD-10-CM

## 2020-09-13 DIAGNOSIS — R202 Paresthesia of skin: Secondary | ICD-10-CM | POA: Diagnosis not present

## 2020-09-13 DIAGNOSIS — M19049 Primary osteoarthritis, unspecified hand: Secondary | ICD-10-CM

## 2020-09-13 DIAGNOSIS — Z8659 Personal history of other mental and behavioral disorders: Secondary | ICD-10-CM

## 2020-09-13 DIAGNOSIS — Z0000 Encounter for general adult medical examination without abnormal findings: Secondary | ICD-10-CM | POA: Diagnosis not present

## 2020-09-13 DIAGNOSIS — Z1231 Encounter for screening mammogram for malignant neoplasm of breast: Secondary | ICD-10-CM | POA: Diagnosis not present

## 2020-09-13 DIAGNOSIS — I10 Essential (primary) hypertension: Secondary | ICD-10-CM | POA: Diagnosis not present

## 2020-09-13 LAB — POCT URINALYSIS DIPSTICK
Appearance: NEGATIVE
Bilirubin, UA: NEGATIVE
Blood, UA: NEGATIVE
Glucose, UA: NEGATIVE
Ketones, UA: NEGATIVE
Leukocytes, UA: NEGATIVE
Nitrite, UA: NEGATIVE
Odor: NEGATIVE
Protein, UA: NEGATIVE
Spec Grav, UA: 1.015 (ref 1.010–1.025)
Urobilinogen, UA: 0.2 E.U./dL
pH, UA: 6.5 (ref 5.0–8.0)

## 2020-09-13 MED ORDER — FLUCONAZOLE 150 MG PO TABS: 150.0000 mg | ORAL_TABLET | Freq: Once | ORAL | 1 refills | Status: AC

## 2020-09-13 MED ORDER — BUTALBITAL-APAP-CAFFEINE 50-325-40 MG PO TABS: ORAL_TABLET | ORAL | 0 refills | Status: DC

## 2020-09-13 NOTE — Progress Notes (Signed)
   Subjective:    Patient ID: Tracie Roach, female    DOB: [birth year], 65 y.o.   MRN: [id number]  HPI 65 year old  Female seen for health maintenance exam and evaluation of medical issues. Gained 6 pounds since last year. Discussion regarding diet and exercise.  History of right thalamic stroke in 10/12/2012.  Has persistent left upper extremity numbness and tightness treated with Flexeril and occasional hydrocodone APAP tablet.  History of hyperlipidemia, essential hypertension, anxiety, migraine headaches.  History of constipation and essential tremor.  No known drug allergies.  Blood pressure is stable on current regimen.  Her husband contracted COVID-19 in late January of this year but she never tested positive.  She has had 3 COVID vaccines.  Had tetanus immunization update in 10/13/19.  Has had Shingrix vaccine.  Had flu vaccine in September 2021.  Past medical history: Appendectomy October 12, 2009.  History of vitreous detachment of left eye.  She wears glasses.  Occasionally has a urinary tract infection.  Social history: She is married with 2 adult children.  Several grandchildren.  Does not smoke or consume alcohol.  Quit smoking in March 2013.  Used to smoke 6 or 7 cigarettes daily.  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  Family history: Father died in Oct 13, 2007 with pulmonary fibrosis.  Mother is living but doing poorly with history of CABG and valve replacement.  1 sister in good health.  No brothers.      Had Colonoscopy Oct 12, 2016 with 10 year follow up recommended Review of Systems-no new complaints     Objective:   Physical Exam Weight 169 pounds, blood pressure 120/70 pulse 90 pulse oximetry 98% weight 169 pounds height 5 feet 4.5 inches BMI 28.56  Skin: Warm and dry.  Nodes none.  TMs are clear.  Pharynx is clear.  Neck is supple without JVD thyromegaly or carotid bruits.  Chest is clear to auscultation.  Cardiac exam: Regular rate and  rhythm normal S1 and S2 without murmurs.  Abdomen is soft, nondistended without hepatosplenomegaly masses or tenderness.  No lower extremity pitting edema.  Neuro: Intact without gross focal deficits.  Affect thought and judgment are normal.       Assessment & Plan:  Essential hypertension-stable on current regimen of amlodipine and losartan  Hyperlipidemia-lipids are normal on statin medication of Zocor 20 mg daily  History of migraine headaches treated with as needed  History of essential tremor which is very mild  History of constipation treated with MiraLAX  History of left retinal detachment  History of thalamic stroke 2012-10-12 with persistent left upper extremity numbness and tightness.  No longer followed by neurology.  Plan: She will continue with current medications and follow-up in 6 months.  Have refilled Diflucan as requested and Xanax.  Generic Fioricet refilled which she uses sparingly.  Takes Elavil 10 mg at bedtime for migraine prophylaxis.

## 2020-09-13 NOTE — Patient Instructions (Addendum)
It was a pleasure to see you today. Have mammogram in June. Immunizations and colonoscopy are up to date. Meds refilled as requested. Labs are excellent.

## 2020-09-24 ENCOUNTER — Other Ambulatory Visit: Payer: Self-pay | Admitting: Internal Medicine

## 2020-11-20 ENCOUNTER — Other Ambulatory Visit: Payer: Self-pay | Admitting: Internal Medicine

## 2021-01-20 ENCOUNTER — Ambulatory Visit: Payer: BC Managed Care – PPO

## 2021-01-20 ENCOUNTER — Other Ambulatory Visit: Payer: Self-pay

## 2021-01-20 ENCOUNTER — Ambulatory Visit
Admission: RE | Admit: 2021-01-20 | Discharge: 2021-01-20 | Disposition: A | Discharge status: Home / Self Care | Payer: BC Managed Care – PPO | Source: Ambulatory Visit | Attending: Internal Medicine | Admitting: Internal Medicine

## 2021-02-01 DIAGNOSIS — H16421 Pannus (corneal), right eye: Secondary | ICD-10-CM | POA: Diagnosis not present

## 2021-02-01 DIAGNOSIS — H353131 Nonexudative age-related macular degeneration, bilateral, early dry stage: Secondary | ICD-10-CM | POA: Diagnosis not present

## 2021-02-01 DIAGNOSIS — H43813 Vitreous degeneration, bilateral: Secondary | ICD-10-CM | POA: Diagnosis not present

## 2021-02-01 DIAGNOSIS — H2513 Age-related nuclear cataract, bilateral: Secondary | ICD-10-CM | POA: Diagnosis not present

## 2021-02-09 ENCOUNTER — Encounter: Payer: Self-pay | Admitting: Internal Medicine

## 2021-02-09 ENCOUNTER — Other Ambulatory Visit: Payer: Self-pay

## 2021-02-09 ENCOUNTER — Telehealth: Payer: Self-pay

## 2021-02-09 ENCOUNTER — Telehealth (INDEPENDENT_AMBULATORY_CARE_PROVIDER_SITE_OTHER): Payer: BC Managed Care – PPO | Admitting: Internal Medicine

## 2021-02-09 VITALS — BP 135/80 | Temp 100.4°F

## 2021-02-09 DIAGNOSIS — I10 Essential (primary) hypertension: Secondary | ICD-10-CM | POA: Diagnosis not present

## 2021-02-09 DIAGNOSIS — E7800 Pure hypercholesterolemia, unspecified: Secondary | ICD-10-CM

## 2021-02-09 DIAGNOSIS — I6930 Unspecified sequelae of cerebral infarction: Secondary | ICD-10-CM | POA: Diagnosis not present

## 2021-02-09 DIAGNOSIS — Z8659 Personal history of other mental and behavioral disorders: Secondary | ICD-10-CM

## 2021-02-09 DIAGNOSIS — U071 COVID-19: Secondary | ICD-10-CM

## 2021-02-09 DIAGNOSIS — Z8669 Personal history of other diseases of the nervous system and sense organs: Secondary | ICD-10-CM

## 2021-02-09 MED ORDER — AZITHROMYCIN 250 MG PO TABS: ORAL_TABLET | ORAL | 0 refills | Status: AC

## 2021-02-09 NOTE — Telephone Encounter (Signed)
Scheduled

## 2021-02-09 NOTE — Telephone Encounter (Signed)
Patient called she tested positive for COVID on Monday she has a fever 100.4 this morning, HA and stuffy nose. She wants to know if there is anything else she could take.  Advised patient we could do a video visit and you can advise her. BP 135/80 O2 93% When would you like to do a video visit?

## 2021-02-09 NOTE — Progress Notes (Signed)
   Subjective:    Patient ID: Tracie Roach, female    DOB: 1955/09/07, 65 y.o.   MRN: [id number]  HPI 65 year old Female with history of hypertension and remote history of right thalamic CVA in 2014 with persistent left upper extremity numbness and tightness has tested positive for COVID-19.  Patient has fever of 100.4 degrees this morning, headache and stuffy nose.  He is asking if there is anything she can take for this.  Patient is seen today via interactive audio and video telecommunications due to the Coronavirus pandemic.  She is identified using 2 identifiers as Tracie Roach. Mogg, a longstanding patient in this practice.  She is at her home and I am at my office.  She is agreeable to visit in this format today.  Our records indicate she has had 3 COVID-19 immunizations during the year 2021.  She has a history of hyperlipidemia, essential hypertension, anxiety and migraine headaches.  History of constipation and essential tremor.  No known drug allergies.  Appendectomy April 2011.  Occasional urinary tract infection.  History of right thalamic stroke in 2014 with persistent left upper extremity numbness and tightness treated with Flexeril and occasional hydrocodone APAP.  Has been released from neurology follow-up.  No known drug allergies.  History of vitreous detachment of the left eye and wears glasses.  Social history: Does not smoke or consume alcohol.  Married with 2 adult children.  Quit smoking in March 2013.  She is a Futures trader.    Review of Systems     Objective:   Physical Exam Patient reports temperature 100.4 degrees blood pressure 135/80 and pulse oximetry 93%.  She looks a bit pale on video.  Does not appear to be short of breath.  Does not sound hoarse.       Assessment & Plan:  COVID-19 virus infection-mild  Essential hypertension- stable on treatment  History of stroke with residual effects  History of migraine headaches  Pure  hypercholesterolemia-treated with statin  Plan: Due to her history of stroke and other medical issues she would qualify for Paxlovid in my opinion.  This was discussed today.  She says she really does not feel all that ill at this point in time.  She says that she feels like she has sinusitis.  We discussed this some more and we decided on a Zithromax Z-PAK 2 tabs day 1 followed by 1 tab days 2 through 5.  She knows she can recontact me if she has worsening of symptoms or any other questions.  Her blood pressure is stable on current regimen.  She will continue to monitor her temperature.  She will walk some to prevent atelectasis and monitor her pulse oximetry.  Time spent with this visit is 20 minutes.

## 2021-02-10 ENCOUNTER — Telehealth: Payer: Self-pay | Admitting: Internal Medicine

## 2021-02-10 NOTE — Telephone Encounter (Signed)
Faxed Positive Home COVID results to [facility] [phone], phone [phone]  Positive 02/09/2021

## 2021-02-12 NOTE — Patient Instructions (Signed)
Take Zithromax Z-PAK 2 tabs day 1 followed by 1 tab days 2 through 5.  Quarantine at home for 5 days.  Monitor blood pressure and temperature.  May take Tylenol for fever.  Monitor pulse oximetry and walk some to prevent atelectasis.  Call if symptoms worsen or not improving.

## 2021-02-14 ENCOUNTER — Other Ambulatory Visit: Payer: Self-pay | Admitting: Internal Medicine

## 2021-03-14 ENCOUNTER — Other Ambulatory Visit: Payer: BC Managed Care – PPO | Admitting: Internal Medicine

## 2021-03-14 ENCOUNTER — Other Ambulatory Visit: Payer: Self-pay

## 2021-03-14 DIAGNOSIS — Z8639 Personal history of other endocrine, nutritional and metabolic disease: Secondary | ICD-10-CM | POA: Diagnosis not present

## 2021-03-14 DIAGNOSIS — I10 Essential (primary) hypertension: Secondary | ICD-10-CM | POA: Diagnosis not present

## 2021-03-14 DIAGNOSIS — E7800 Pure hypercholesterolemia, unspecified: Secondary | ICD-10-CM | POA: Diagnosis not present

## 2021-03-15 LAB — COMPREHENSIVE METABOLIC PANEL
AG Ratio: 1.5 (calc) (ref 1.0–2.5)
ALT: 17 U/L (ref 6–29)
AST: 22 U/L (ref 10–35)
Albumin: 4.3 g/dL (ref 3.6–5.1)
Alkaline phosphatase (APISO): 119 U/L (ref 37–153)
BUN: 12 mg/dL (ref 7–25)
CO2: 32 mmol/L (ref 20–32)
Calcium: 9.9 mg/dL (ref 8.6–10.4)
Chloride: 103 mmol/L (ref 98–110)
Creat: 0.89 mg/dL (ref 0.50–1.05)
Globulin: 2.9 g/dL (calc) (ref 1.9–3.7)
Glucose, Bld: 100 mg/dL — ABNORMAL HIGH (ref 65–99)
Potassium: 5.1 mmol/L (ref 3.5–5.3)
Sodium: 141 mmol/L (ref 135–146)
Total Bilirubin: 0.4 mg/dL (ref 0.2–1.2)
Total Protein: 7.2 g/dL (ref 6.1–8.1)

## 2021-03-15 LAB — LIPID PANEL
Cholesterol: 189 mg/dL (ref <200)
HDL: 55 mg/dL (ref >=50)
LDL Cholesterol (Calc): 114 mg/dL (calc) — ABNORMAL HIGH
Non-HDL Cholesterol (Calc): 134 mg/dL (calc) — ABNORMAL HIGH (ref <130)
Total CHOL/HDL Ratio: 3.4 (calc) (ref <5.0)
Triglycerides: 92 mg/dL (ref <150)

## 2021-03-15 LAB — HEMOGLOBIN A1C
Hgb A1c MFr Bld: 5.4 % of total Hgb (ref <5.7)
Mean Plasma Glucose: 108 mg/dL
eAG (mmol/L): 6 mmol/L

## 2021-03-17 ENCOUNTER — Other Ambulatory Visit: Payer: Self-pay

## 2021-03-17 ENCOUNTER — Ambulatory Visit: Payer: BC Managed Care – PPO | Admitting: Internal Medicine

## 2021-03-17 VITALS — BP 126/74 | HR 85 | Temp 97.8°F | Resp 15 | Ht 64.5 in | Wt 166.0 lb

## 2021-03-17 DIAGNOSIS — I10 Essential (primary) hypertension: Secondary | ICD-10-CM | POA: Diagnosis not present

## 2021-03-17 DIAGNOSIS — Z8659 Personal history of other mental and behavioral disorders: Secondary | ICD-10-CM | POA: Diagnosis not present

## 2021-03-17 DIAGNOSIS — J309 Allergic rhinitis, unspecified: Secondary | ICD-10-CM

## 2021-03-17 DIAGNOSIS — I6930 Unspecified sequelae of cerebral infarction: Secondary | ICD-10-CM | POA: Diagnosis not present

## 2021-03-17 DIAGNOSIS — Z8669 Personal history of other diseases of the nervous system and sense organs: Secondary | ICD-10-CM | POA: Diagnosis not present

## 2021-03-17 DIAGNOSIS — Z23 Encounter for immunization: Secondary | ICD-10-CM | POA: Diagnosis not present

## 2021-03-17 DIAGNOSIS — Z8616 Personal history of COVID-19: Secondary | ICD-10-CM

## 2021-03-17 DIAGNOSIS — E7800 Pure hypercholesterolemia, unspecified: Secondary | ICD-10-CM

## 2021-03-17 DIAGNOSIS — R202 Paresthesia of skin: Secondary | ICD-10-CM

## 2021-03-17 MED ORDER — SIMVASTATIN 20 MG PO TABS: 20.0000 mg | ORAL_TABLET | Freq: Every day | ORAL | 3 refills | Status: DC

## 2021-03-17 MED ORDER — HYDROCODONE-ACETAMINOPHEN 5-325 MG PO TABS: ORAL_TABLET | ORAL | 0 refills | Status: DC

## 2021-03-17 MED ORDER — LOSARTAN POTASSIUM 100 MG PO TABS: 100.0000 mg | ORAL_TABLET | Freq: Every day | ORAL | 3 refills | Status: DC

## 2021-03-17 MED ORDER — METOPROLOL TARTRATE 25 MG PO TABS: 12.5000 mg | ORAL_TABLET | Freq: Two times a day (BID) | ORAL | 3 refills | Status: DC

## 2021-03-17 NOTE — Progress Notes (Signed)
   Subjective:    Patient ID: Tracie Roach, female    DOB: 13-Sep-1955, 65 y.o.   MRN: [id number]  HPI 65 year old Female for 6 month follow up.  Had mild case of COVID-19 in late August.  She has a history of right thalamic stroke in 2014 and has persistent left upper extremity numbness and tightness treated with Flexeril and occasional hydrocodone APAP tablets.  History of hyperlipidemia, essential hypertension, anxiety, migraine headaches, constipation and essential tremor.  Blood pressure has been stable on current regimen.  LDL is very slightly elevated at 114 and in March was 85, hemoglobin A1c is normal, liver functions are normal.  Glucose is 100 fasting.  Review of Systems migraine headaches a bit more frequent. Has recovered form Covid     Objective:   Physical Exam  Blood pressure 126/74 pulse 85 respiratory rate 15 pulse oximetry 97% weight 166 pounds BMI 28.05 skin: Warm and dry.  No cervical adenopathy or thyromegaly.  Chest clear.  Cardiac exam: Regular rate and rhythm.  No lower extremity pitting edema.  Cerumen removed from right external ear canal      Assessment & Plan:  Impacted cerumen right external ear canal  Essential hypertension stable on current regimen of losartan and metoprolol  History of thalamic stroke  Hyperlipidemia treated with statin medication and LDL slightly elevated at 114  History of COVID-19-had mild case  Anxiety treated with Xanax at bedtime  History of migraine headaches treated with as needed hydrocodone APAP or Fioricet.  Takes Elavil 10 mg for migraine prophylaxis.  Sometimes has musculoskeletal pain and takes Flexeril.  Plan: Return in March for health maintenance exam.  Flu vaccine given

## 2021-03-21 ENCOUNTER — Other Ambulatory Visit: Payer: Self-pay | Admitting: Internal Medicine

## 2021-03-28 ENCOUNTER — Other Ambulatory Visit: Payer: Self-pay

## 2021-03-28 ENCOUNTER — Ambulatory Visit (INDEPENDENT_AMBULATORY_CARE_PROVIDER_SITE_OTHER): Payer: BC Managed Care – PPO | Admitting: Internal Medicine

## 2021-03-28 ENCOUNTER — Encounter: Payer: Self-pay | Admitting: Internal Medicine

## 2021-03-28 ENCOUNTER — Telehealth: Payer: Self-pay

## 2021-03-28 ENCOUNTER — Ambulatory Visit
Admission: RE | Admit: 2021-03-28 | Discharge: 2021-03-28 | Disposition: A | Discharge status: Home / Self Care | Payer: BC Managed Care – PPO | Source: Ambulatory Visit | Attending: Internal Medicine | Admitting: Internal Medicine

## 2021-03-28 VITALS — BP 138/72 | HR 94 | Ht 64.5 in | Wt 167.0 lb

## 2021-03-28 DIAGNOSIS — I6930 Unspecified sequelae of cerebral infarction: Secondary | ICD-10-CM

## 2021-03-28 DIAGNOSIS — J189 Pneumonia, unspecified organism: Secondary | ICD-10-CM

## 2021-03-28 DIAGNOSIS — R051 Acute cough: Secondary | ICD-10-CM | POA: Diagnosis not present

## 2021-03-28 DIAGNOSIS — Z8616 Personal history of COVID-19: Secondary | ICD-10-CM

## 2021-03-28 DIAGNOSIS — R059 Cough, unspecified: Secondary | ICD-10-CM

## 2021-03-28 DIAGNOSIS — R079 Chest pain, unspecified: Secondary | ICD-10-CM | POA: Diagnosis not present

## 2021-03-28 DIAGNOSIS — I10 Essential (primary) hypertension: Secondary | ICD-10-CM

## 2021-03-28 DIAGNOSIS — Z8659 Personal history of other mental and behavioral disorders: Secondary | ICD-10-CM

## 2021-03-28 MED ORDER — HYDROCODONE BIT-HOMATROP MBR 5-1.5 MG/5ML PO SOLN: 5.0000 mL | Freq: Three times a day (TID) | ORAL | 0 refills | Status: DC | PRN

## 2021-03-28 MED ORDER — DOXYCYCLINE HYCLATE 100 MG PO TABS: 100.0000 mg | ORAL_TABLET | Freq: Two times a day (BID) | ORAL | 0 refills | Status: DC

## 2021-03-28 NOTE — Telephone Encounter (Signed)
Patient states that she has what started as a tingle in the back of the throat and went into her sinuses. She has been dealing with this for 2 weeks now. She has yellow mucous and sounds like she is purring when she breathes. She is asking for an appt but will do virtual if you want. Covid test have been negative. Denies fever, sore throat and H/A

## 2021-03-28 NOTE — Progress Notes (Signed)
   Subjective:    Patient ID: Tracie Roach, female    DOB: 01-04-1956, 65 y.o.   MRN: [id number]  HPI 65 year old Female presents with cough for 2 weeks.  Has had discolored sputum production.  Has had sore throat and headache but denies fever.  Had COVID 19 at the end of August.  Does not think this is COVID.  Has been taking Mucinex.  Has had a deep congested cough.  She has a history of essential hypertension, hyperlipidemia, anxiety, history of thalamic stroke, history of migraine headaches and musculoskeletal pain.  Is around grandchildren who have been in daycare for the first time recently.  Review of Systems no fever or chills, no nausea or vomiting, some malaise and fatigue     Objective:   Physical Exam She is afebrile.  Blood pressure 138/72 pulse 94 pulse oximetry 96% weight 167 pounds Skin warm and dry.  Nodes none.  TMs are clear.  Neck is supple.  Has coarse breath sounds and rhonchi in right lower lobe.  X-ray reveals probable right infrahilar patchy bronchopneumonia      Assessment & Plan:  Right infrahilar pneumonia  Acute lower respiratory infection  Respiratory virus panel was obtained but was not resulted by lab.  Doubt COVID-19 since she recently had that.  Grandchildren have been sick recently.  Therefore, could be rhinovirus or RSV.  Plan: Hycodan 1 teaspoon every 8 hours as needed for cough.  Doxycycline 100 mg twice daily for 10 days.  Rest and drink fluids.  Monitor pulse oximetry.  Return October 24 for follow-up.  Call if symptoms not improving or worsen in the meantime.

## 2021-03-28 NOTE — Telephone Encounter (Signed)
scheduled

## 2021-03-29 LAB — TIQ-NTM

## 2021-03-29 LAB — RESPIRATORY VIRUS PANEL

## 2021-03-30 ENCOUNTER — Other Ambulatory Visit: Payer: Self-pay

## 2021-03-30 DIAGNOSIS — R059 Cough, unspecified: Secondary | ICD-10-CM

## 2021-03-30 DIAGNOSIS — J189 Pneumonia, unspecified organism: Secondary | ICD-10-CM

## 2021-03-30 NOTE — Progress Notes (Signed)
Xray order for 2 weeks put in. Read stat note put on it. Appt that morning here.

## 2021-04-11 ENCOUNTER — Other Ambulatory Visit: Payer: Self-pay

## 2021-04-11 ENCOUNTER — Ambulatory Visit: Payer: BC Managed Care – PPO | Admitting: Internal Medicine

## 2021-04-11 ENCOUNTER — Encounter: Payer: Self-pay | Admitting: Internal Medicine

## 2021-04-11 ENCOUNTER — Ambulatory Visit
Admission: RE | Admit: 2021-04-11 | Discharge: 2021-04-11 | Disposition: A | Discharge status: Home / Self Care | Payer: BC Managed Care – PPO | Source: Ambulatory Visit | Attending: Internal Medicine | Admitting: Internal Medicine

## 2021-04-11 VITALS — BP 112/66 | HR 82 | Temp 99.2°F | Ht 64.5 in | Wt 166.0 lb

## 2021-04-11 DIAGNOSIS — J189 Pneumonia, unspecified organism: Secondary | ICD-10-CM

## 2021-04-11 DIAGNOSIS — Z8701 Personal history of pneumonia (recurrent): Secondary | ICD-10-CM | POA: Diagnosis not present

## 2021-04-11 DIAGNOSIS — R059 Cough, unspecified: Secondary | ICD-10-CM

## 2021-04-11 NOTE — Patient Instructions (Addendum)
Pneumonia has resolved on repeat chest x-ray.  Return as needed.  She has follow-up appointment here in October.  Consider giving pneumococcal 20 at that time.

## 2021-04-11 NOTE — Progress Notes (Signed)
   Subjective:    Patient ID: Tracie Roach, female    DOB: December 23, 1955, 65 y.o.   MRN: [id number]  HPI  65 year old Female for follow up on right perihilar pneumonia diagnosed on October 10th. Both patient, her husband and son were ill with similar  respiratory illness but she is the only one who had pneumonia.Had onset of symptoms about 2 weeks prior and grandchildren came home from preschool with respiratory illnesses at that time.  Had Covid-19 in August but recovered after brief illness.  Grandchildren had Covid and since then have had RSV, patient is feeling better. No SOB.  Was treated with Hycodan and doxycycline. Feeling better since being treated.  Chest x-ray at that time on October 10 showed diffuse interstitial prominence.  Patient is a non-smoker.  There was probable right infrahilar patchy bronchopneumonia.  She did have coarse breath sounds and rales in the right lower lobe on exam at that time.  Repeat CXR is pending.  Review of Systems see above has some slight cough but overall is feeling much better.    Objective:   Physical Exam   T 99.2,  pulse 82 regular, Pulse ox 98% weight 166 pounds. BMI 28.05 Skin is warm and dry.  No cervical adenopathy.  Chest is completely clear to auscultation.  Repeat x-ray shows resolution of right infrahilar airspace disease compatible with pneumonia.     Assessment & Plan:  Right infrahilar airspace disease consistent with pneumonia-resolved on chest x-ray today after being treated with doxycycline.  Patient feeling much better.  Plan: Return as needed.  Pneumonia has resolved on repeat chest x-ray.  She has not had pneumococcal 20 vaccine.  Can get that at next visit.

## 2021-04-13 ENCOUNTER — Encounter: Payer: Self-pay | Admitting: Internal Medicine

## 2021-04-13 NOTE — Patient Instructions (Addendum)
Continue current medications for hypertension.  Watch diet a bit as LDL was slightly elevated but continue statin medication.  Continue Xanax as needed for anxiety/insomnia.  May take hydrocodone APAP or Fioricet on a as needed basis sparingly for migraine headaches and continue with Elavil 10 mg daily for migraine prophylaxis.  Return in March for health maintenance exam.  Flu vaccine given

## 2021-04-17 ENCOUNTER — Encounter: Payer: Self-pay | Admitting: Internal Medicine

## 2021-04-17 NOTE — Patient Instructions (Signed)
Patient had chest x-ray at my request which revealed  right infrahilar pneumonia.  Patient will be treated with doxycycline 100 mg twice daily for 10 days.  Return October 24.  May take Hycodan sparingly every 8 hours as needed for cough.  Rest and drink fluids.  Monitor pulse oximetry.

## 2021-04-19 ENCOUNTER — Other Ambulatory Visit: Payer: Self-pay | Admitting: Internal Medicine

## 2021-05-09 ENCOUNTER — Telehealth: Payer: Self-pay | Admitting: Internal Medicine

## 2021-05-09 ENCOUNTER — Telehealth (INDEPENDENT_AMBULATORY_CARE_PROVIDER_SITE_OTHER): Payer: BC Managed Care – PPO | Admitting: Internal Medicine

## 2021-05-09 ENCOUNTER — Other Ambulatory Visit: Payer: Self-pay

## 2021-05-09 ENCOUNTER — Encounter: Payer: Self-pay | Admitting: Internal Medicine

## 2021-05-09 VITALS — BP 125/73 | HR 79 | Temp 98.3°F

## 2021-05-09 DIAGNOSIS — J069 Acute upper respiratory infection, unspecified: Secondary | ICD-10-CM

## 2021-05-09 DIAGNOSIS — Z8659 Personal history of other mental and behavioral disorders: Secondary | ICD-10-CM

## 2021-05-09 DIAGNOSIS — I6930 Unspecified sequelae of cerebral infarction: Secondary | ICD-10-CM

## 2021-05-09 DIAGNOSIS — I10 Essential (primary) hypertension: Secondary | ICD-10-CM

## 2021-05-09 DIAGNOSIS — Z8669 Personal history of other diseases of the nervous system and sense organs: Secondary | ICD-10-CM

## 2021-05-09 MED ORDER — FLUCONAZOLE 150 MG PO TABS: 150.0000 mg | ORAL_TABLET | Freq: Once | ORAL | 0 refills | Status: AC

## 2021-05-09 MED ORDER — DOXYCYCLINE HYCLATE 100 MG PO TABS: 100.0000 mg | ORAL_TABLET | Freq: Two times a day (BID) | ORAL | 0 refills | Status: DC

## 2021-05-09 MED ORDER — HYDROCODONE BIT-HOMATROP MBR 5-1.5 MG/5ML PO SOLN: 5.0000 mL | Freq: Three times a day (TID) | ORAL | 0 refills | Status: DC | PRN

## 2021-05-09 NOTE — Progress Notes (Signed)
   Subjective:    Patient ID: Tracie Roach, female    DOB: 1955-08-05, 65 y.o.   MRN: [id number]  HPI 65 year old Female seen today via interactive audio and video telecommunications due to the Coronavirus pandemic.  Patient is agreeable to visit in this format today.  She is identified using 2 identifiers as Tracie Roach. Montez Morita, a patient in this practice.  She is at her home and I am at my office.  Patient has 2 grandchildren, 1 of whom is in preschool.  They have been sick a lot recently.  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  Patient has headache, sore throat, discolored nasal drainage, malaise and fatigue.  She was seen and treated in mid October for right infrahilar bronchopneumonia.  Repeat chest x-ray October 24 was clear.  Patient had COVID-19 in late August.  Patient has history of essential hypertension, anxiety, migraine headaches, essential tremor, hyperlipidemia.  In 2014 she had a right thalamic stroke and has some persistent left upper extremity numbness and tightness.  Review of Systems she has malaise and fatigue.  Has been taking Hycodan from previous treatment in October for cough.  Main complaint today is headache and discolored drainage.  Some sore throat.  Took several COVID test which were negative for COVID-19.     Objective:   Physical Exam Temperature 98.3 degrees pulse 79 regular blood pressure 125/73 pulse oximetry 97%  She is seen virtually and looks a bit pale and fatigued.  She sounds slightly hoarse and nasally congested.  She states she has a headache.       Assessment & Plan:  Acute maxillary sinusitis  Acute pharyngitis  Plan: Doxycycline 100 mg twice daily for 10 days which worked well for her when she had pneumonia.  Refill Hycodan 1 teaspoon every 8 hours as needed for cough, sore throat pain ,and headache.  Rest and drink plenty of fluids.  Diflucan if needed for Candida  vaginitis while on antibiotics.  Call if not improving next week or sooner if worse.  Time spent with this visit is 20 minutes

## 2021-05-09 NOTE — Telephone Encounter (Signed)
scheduled

## 2021-05-09 NOTE — Telephone Encounter (Signed)
Tracie Roach [phone]  [name] called to say she has cough, green mucus, head feels tight, sore throat, has been this was since Thursday. She was around [place] and the kids.

## 2021-05-09 NOTE — Patient Instructions (Signed)
Take doxycycline 100 mg twice daily for 10 days.  I have refilled Hycodan 1 teaspoon every 8 hours as needed for cough, sore throat pain and headache.  Diflucan if needed for Candida vaginitis while on antibiotics.  Rest and drink plenty of fluids.  Call if not improving next week or sooner if worse.

## 2021-06-14 ENCOUNTER — Encounter: Payer: Self-pay | Admitting: Internal Medicine

## 2021-06-14 ENCOUNTER — Telehealth: Payer: Self-pay | Admitting: Internal Medicine

## 2021-06-14 ENCOUNTER — Telehealth (INDEPENDENT_AMBULATORY_CARE_PROVIDER_SITE_OTHER): Payer: BC Managed Care – PPO | Admitting: Internal Medicine

## 2021-06-14 VITALS — BP 136/79 | HR 71 | Temp 98.7°F

## 2021-06-14 DIAGNOSIS — F411 Generalized anxiety disorder: Secondary | ICD-10-CM

## 2021-06-14 DIAGNOSIS — I6930 Unspecified sequelae of cerebral infarction: Secondary | ICD-10-CM

## 2021-06-14 DIAGNOSIS — J0100 Acute maxillary sinusitis, unspecified: Secondary | ICD-10-CM | POA: Diagnosis not present

## 2021-06-14 DIAGNOSIS — Z8669 Personal history of other diseases of the nervous system and sense organs: Secondary | ICD-10-CM

## 2021-06-14 DIAGNOSIS — I10 Essential (primary) hypertension: Secondary | ICD-10-CM

## 2021-06-14 DIAGNOSIS — J22 Unspecified acute lower respiratory infection: Secondary | ICD-10-CM | POA: Diagnosis not present

## 2021-06-14 MED ORDER — AMOXICILLIN-POT CLAVULANATE 875-125 MG PO TABS: 1.0000 | ORAL_TABLET | Freq: Two times a day (BID) | ORAL | 0 refills | Status: DC

## 2021-06-14 MED ORDER — FLUCONAZOLE 150 MG PO TABS: 150.0000 mg | ORAL_TABLET | Freq: Once | ORAL | 0 refills | Status: AC

## 2021-06-14 NOTE — Telephone Encounter (Signed)
Scheduled

## 2021-06-14 NOTE — Patient Instructions (Signed)
Take Augmentin 875/125 twice daily with meals for 10 days.  Diflucan 150 mg tablet to take if needed for Candida vaginitis.  Rest and stay well-hydrated.  Quarantine at home until better.

## 2021-06-14 NOTE — Progress Notes (Addendum)
° °  Subjective:    Patient ID: Tracie Roach, female    DOB: 1956-02-05, 65 y.o.   MRN: [id number]  HPI 65 year old Female seen today via interactive audio and video telecommunications due to the Coronavirus pandemic.  She is identified using 2 identifiers as Tracie Spruiell. Roach, a longstanding patient in this practice.  She is agreeable to visit in this format today.  She is at her home and I am at my office.  She has had considerable issues with recurrent respiratory infections this year presumably due to grandchildren going to preschool this year.  In November she had an acute upper respiratory infection and was diagnosed with acute maxillary sinusitis and acute pharyngitis treated with doxycycline.  In October she had community-acquired pneumonia (right perihilar) treated with doxycycline.  In August she had acute COVID-19 virus infection.  She has a history of migraine headaches, remote history of CVA, hypertension, hyperlipidemia and anxiety.  Patient says she has tested negative for COVID-19  Review of Systems-patient denies nausea vomiting headache  Complaining of green mucus from nasal passages and that her ear is stopped up.  Her voice is noted to be hoarse.  Denies sore throat.  Denies wheezing.  Has been taking Mucinex and Xyzal.  Complaining of discolored sputum production with green sputum.  Has a headache.     Objective:   Physical Exam Vital signs are reviewed.  Her blood pressure is stable.  She is afebrile.  Pulse oximetry 98% She looks a bit pale and sounds a bit hoarse when speaking.  She sounds nasally congested.  She is able to give a clear concise history.  Not thought to be tachypneic  Not heard to be coughing on this interview.    Assessment & Plan:   Acute Maxillary sinusitis  Acute lower respiratory infection  Essential HTN  Hx of CVA  Plan: Augmentin 875/125 twice daily with meals for 10 days.  Diflucan 150 mg tablet to take if needed for Candida  vaginitis. Rest and stay well hydrated. Quarantine at home until better.  Time spent reviewing records, interviewing patient, medical decision making and personally e-scribing medications is 20 minutes.

## 2021-06-14 NOTE — Telephone Encounter (Signed)
Tracie Roach [phone]  [name] called to say she has Head congestion, cough, Hoarse, greenish thick mucus,Ears feel thick, started feeling this way on Thursday and took COVID test on Thursday. I have ask her to take another one this morning and call back with results, No Fever.

## 2021-07-11 ENCOUNTER — Telehealth: Payer: Self-pay | Admitting: Internal Medicine

## 2021-07-11 ENCOUNTER — Ambulatory Visit (INDEPENDENT_AMBULATORY_CARE_PROVIDER_SITE_OTHER): Payer: BC Managed Care – PPO | Admitting: Internal Medicine

## 2021-07-11 ENCOUNTER — Other Ambulatory Visit: Payer: Self-pay

## 2021-07-11 ENCOUNTER — Encounter: Payer: Self-pay | Admitting: Internal Medicine

## 2021-07-11 VITALS — BP 122/68 | HR 72 | Temp 98.2°F | Wt 170.5 lb

## 2021-07-11 DIAGNOSIS — B9689 Other specified bacterial agents as the cause of diseases classified elsewhere: Secondary | ICD-10-CM | POA: Diagnosis not present

## 2021-07-11 DIAGNOSIS — N760 Acute vaginitis: Secondary | ICD-10-CM | POA: Diagnosis not present

## 2021-07-11 MED ORDER — METRONIDAZOLE 500 MG PO TABS: 500.0000 mg | ORAL_TABLET | Freq: Two times a day (BID) | ORAL | 0 refills | Status: AC

## 2021-07-11 NOTE — Progress Notes (Signed)
° °  Subjective:    Patient ID: Tracie Roach, female    DOB: [birth year], 66 y.o.   MRN: [id number]  HPI 66 year old Female seen for vaginitis symptoms. Patient complaining of discolored vaginal discharge, no fever, chills, dysuria. Has a "heavy" feeling in vagina. Says she had similar problem a number of years ago treated with an antibiotic. She does not use estrogen replacement cream.  Had normal Pap smear in 2021. Sexually active with husband.  Review of Systems no nausea or vomiting and no dysuria or frequency     Objective:   Physical Exam BP 122/68 pulse 72 T98.2 degrees Weight 170 pounds   Scant white vaginal discharge. Wet prep reveals clue cells and bacteria.  Inspection shows normal Female external genitalia. There is vaginal atrophy consistent with menopause. No discharge from cervical os.      Assessment & Plan:  Bacterial vaginosis  Plan: Flagyl 500 mg twice daily x 7 days after which she may use a vinegar and water douche if she would like. Call if not better in 10 days to 2 weeks.

## 2021-07-11 NOTE — Patient Instructions (Signed)
Wet prep shows bacteria and very few yeast.  You have been diagnosed with bacterial vaginosis.  Please take Flagyl 500 mg twice daily for 7 days.  Let me know if not better in 10 days to 2 weeks or sooner if worse.

## 2021-07-11 NOTE — Telephone Encounter (Signed)
scheduled

## 2021-07-11 NOTE — Telephone Encounter (Signed)
Tracie Roach [phone]  Codee called to say she has thinks she may hay a yeast infection she has discolored discharge, and a heavy feeling, she has already done 8 days of Monistat.

## 2021-07-21 NOTE — Telephone Encounter (Signed)
Tracie Roach called to say she is still having some discharge and uncomfortable felling, she also stated it was uncomfortable during intercourse.  She would like a referral to GYN, I had her check because she was concerned about who was in network. Dr Leda Quail  could not see until April Dr Candice Camp - could see February

## 2021-07-21 NOTE — Telephone Encounter (Signed)
Referral placed.

## 2021-07-26 DIAGNOSIS — N952 Postmenopausal atrophic vaginitis: Secondary | ICD-10-CM | POA: Diagnosis not present

## 2021-09-12 ENCOUNTER — Other Ambulatory Visit: Payer: Medicare Other

## 2021-09-12 ENCOUNTER — Other Ambulatory Visit: Payer: Self-pay

## 2021-09-12 DIAGNOSIS — E7849 Other hyperlipidemia: Secondary | ICD-10-CM

## 2021-09-12 DIAGNOSIS — R5383 Other fatigue: Secondary | ICD-10-CM | POA: Diagnosis not present

## 2021-09-12 DIAGNOSIS — I10 Essential (primary) hypertension: Secondary | ICD-10-CM | POA: Diagnosis not present

## 2021-09-12 DIAGNOSIS — R7301 Impaired fasting glucose: Secondary | ICD-10-CM | POA: Diagnosis not present

## 2021-09-15 ENCOUNTER — Encounter: Payer: Self-pay | Admitting: Internal Medicine

## 2021-09-15 ENCOUNTER — Ambulatory Visit (INDEPENDENT_AMBULATORY_CARE_PROVIDER_SITE_OTHER): Payer: Medicare Other | Admitting: Internal Medicine

## 2021-09-15 VITALS — BP 122/72 | HR 77 | Temp 98.3°F | Ht 66.0 in | Wt 167.0 lb

## 2021-09-15 DIAGNOSIS — E875 Hyperkalemia: Secondary | ICD-10-CM

## 2021-09-15 DIAGNOSIS — I10 Essential (primary) hypertension: Secondary | ICD-10-CM | POA: Diagnosis not present

## 2021-09-15 DIAGNOSIS — I6930 Unspecified sequelae of cerebral infarction: Secondary | ICD-10-CM

## 2021-09-15 DIAGNOSIS — Z8669 Personal history of other diseases of the nervous system and sense organs: Secondary | ICD-10-CM

## 2021-09-15 DIAGNOSIS — Z23 Encounter for immunization: Secondary | ICD-10-CM

## 2021-09-15 DIAGNOSIS — E7800 Pure hypercholesterolemia, unspecified: Secondary | ICD-10-CM

## 2021-09-15 DIAGNOSIS — Z0000 Encounter for general adult medical examination without abnormal findings: Secondary | ICD-10-CM | POA: Diagnosis not present

## 2021-09-15 DIAGNOSIS — Z8659 Personal history of other mental and behavioral disorders: Secondary | ICD-10-CM

## 2021-09-15 DIAGNOSIS — R202 Paresthesia of skin: Secondary | ICD-10-CM

## 2021-09-15 DIAGNOSIS — J309 Allergic rhinitis, unspecified: Secondary | ICD-10-CM

## 2021-09-15 LAB — COMPLETE METABOLIC PANEL WITH GFR
AG Ratio: 1.6 (calc) (ref 1.0–2.5)
ALT: 20 U/L (ref 6–29)
AST: 23 U/L (ref 10–35)
Albumin: 4.4 g/dL (ref 3.6–5.1)
Alkaline phosphatase (APISO): 125 U/L (ref 37–153)
BUN: 12 mg/dL (ref 7–25)
CO2: 30 mmol/L (ref 20–32)
Calcium: 10 mg/dL (ref 8.6–10.4)
Chloride: 107 mmol/L (ref 98–110)
Creat: 0.76 mg/dL (ref 0.50–1.05)
Globulin: 2.8 g/dL (calc) (ref 1.9–3.7)
Glucose, Bld: 102 mg/dL — ABNORMAL HIGH (ref 65–99)
Potassium: 5.5 mmol/L — ABNORMAL HIGH (ref 3.5–5.3)
Sodium: 144 mmol/L (ref 135–146)
Total Bilirubin: 0.4 mg/dL (ref 0.2–1.2)
Total Protein: 7.2 g/dL (ref 6.1–8.1)
eGFR: 87 mL/min/{1.73_m2} (ref >=60)

## 2021-09-15 LAB — POTASSIUM: Potassium: 4.2 mmol/L (ref 3.5–5.3)

## 2021-09-15 LAB — LIPID PANEL
Cholesterol: 151 mg/dL (ref <200)
HDL: 53 mg/dL (ref >=50)
LDL Cholesterol (Calc): 81 mg/dL (calc)
Non-HDL Cholesterol (Calc): 98 mg/dL (calc) (ref <130)
Total CHOL/HDL Ratio: 2.8 (calc) (ref <5.0)
Triglycerides: 88 mg/dL (ref <150)

## 2021-09-15 LAB — CBC WITH DIFFERENTIAL/PLATELET
Absolute Monocytes: 442 cells/uL (ref 200–950)
Basophils Absolute: 52 cells/uL (ref 0–200)
Basophils Relative: 0.8 %
Eosinophils Absolute: 358 cells/uL (ref 15–500)
Eosinophils Relative: 5.5 %
HCT: 42.1 % (ref 35.0–45.0)
Hemoglobin: 14 g/dL (ref 11.7–15.5)
Lymphs Abs: 2171 cells/uL (ref 850–3900)
MCH: 30.8 pg (ref 27.0–33.0)
MCHC: 33.3 g/dL (ref 32.0–36.0)
MCV: 92.7 fL (ref 80.0–100.0)
MPV: 10.3 fL (ref 7.5–12.5)
Monocytes Relative: 6.8 %
Neutro Abs: 3478 cells/uL (ref 1500–7800)
Neutrophils Relative %: 53.5 %
Platelets: 340 10*3/uL (ref 140–400)
RBC: 4.54 10*6/uL (ref 3.80–5.10)
RDW: 11.7 % (ref 11.0–15.0)
Total Lymphocyte: 33.4 %
WBC: 6.5 10*3/uL (ref 3.8–10.8)

## 2021-09-15 LAB — POCT URINALYSIS DIPSTICK
Bilirubin, UA: NEGATIVE
Blood, UA: NEGATIVE
Glucose, UA: NEGATIVE
Ketones, UA: NEGATIVE
Leukocytes, UA: NEGATIVE
Nitrite, UA: NEGATIVE
Protein, UA: NEGATIVE
Spec Grav, UA: <=1.005 — AB (ref 1.010–1.025)
Urobilinogen, UA: 0.2 E.U./dL
pH, UA: 8.5 — AB (ref 5.0–8.0)

## 2021-09-15 LAB — TSH: TSH: 2.45 mIU/L (ref 0.40–4.50)

## 2021-09-15 LAB — HEMOGLOBIN A1C
Hgb A1c MFr Bld: 5.4 % of total Hgb (ref <5.7)
Mean Plasma Glucose: 108 mg/dL
eAG (mmol/L): 6 mmol/L

## 2021-09-15 MED ORDER — HYDROCODONE-ACETAMINOPHEN 5-325 MG PO TABS: 1.0000 | ORAL_TABLET | Freq: Four times a day (QID) | ORAL | 0 refills | Status: DC | PRN

## 2021-09-15 NOTE — Progress Notes (Signed)
? ? ? ?Annual Wellness Visit ? ?  ? ?Patient: Tracie Roach, Female    DOB: 1956/03/22, 66 y.o.   MRN: [id number] ?Visit Date: 09/15/2021 ? ?Chief Complaint  ?Patient presents with  ? Medicare Wellness  ? ?Subjective  ?  ?Tracie Roach is a 66 y.o. female who presents today for her Annual Wellness Visit.  ? ?HPI 66 year old Female seen also for health maintenance exam and evaluation of medical issues. ? ?Her serum potassium is elevated with recent lab draw.  She is on losartan.  This will be repeated today. ? ?She request refill for hydrocodone APAP 5/325, number 15 tablets with no refill use sparingly for musculoskeletal pain related to a stroke. ? ?History of migraine headaches for which she takes Fioricet sparingly. ? ?History of right thalamic stroke in 2012/10/19.  Has persistent left upper extremity numbness and tightness treated with Flexeril and occasional hydrocodone APAP tablet. ? ?History of hypertension, hyperlipidemia, anxiety, essential tremor, constipation. ? ?She had COVID-19 in August 2022. ? ?Past medical history: Appendectomy 10/19/2009.  History of vitreous detachment of left eye.  She wears glasses. ? ?Occasionally has a urinary infection.  Has seen OB/GYN, Dr. Rana Snare recently regarding atrophic vaginitis. ? ?Social history: Married with 2 adult children.  Several grandchildren.  Does not smoke or consume alcohol.  Quit smoking in March 2013.  Used to smoke 6 or 7 cigarettes daily.  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 ? ?Family history: Father died in 20-Oct-2007 with pulmonary fibrosis.  Mother living but in poor health with history of CABG and valve replacement.  1 sister in good health.  No brothers. ? ? ?Social History  ? ?Social History Narrative  ?    ?   ? Social history: She is married with 2 adult children.  Several grandchildren.  Does not smoke or consume alcohol.  Quit smoking in March 2013.  Used to smoke 6 or 7 cigarettes daily.  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 Patient lives with husband [name]. Patient has 2 years of community college.  ?    ? Family history: Father died in Oct 20, 2007 with pulmonary fibrosis.  Mother is living but doing poorly with history of CABG and valve replacement.  1 sister in good health.  No brothers.  ? ? ?Patient Care Team: ?Margaree Mackintosh, MD as PCP - General (Internal Medicine) ? ?Review of Systems feels well with no new complaints. Hx migraine headaches. Some musculoskeletal pain due to spasm and prior hx of stroke. Takes Norco 5/325 sparingly. ? ? Objective  ?  ?Vitals: BP 122/72   Pulse 77   Temp 98.3 ?F (36.8 ?C) (Tympanic)   Ht 5\' 6"  (1.676 m)   Wt 167 lb (75.8 kg)   SpO2 98%   BMI 26.95 kg/m?  ? ?Physical Exam ? ?Skin: Warm and dry.  No cervical adenopathy or thyromegaly.  No carotid bruits.  Chest is clear to auscultation without rales or wheezing.  Breasts are without masses.  Abdomen is soft nondistended without hepatosplenomegaly, masses, or tenderness.  No lower extremity pitting edema.  Neurological exam is intact without gross focal deficits.  Affect thought and judgment are normal.  GYN exam deferred to GYN physician that she saw her recently.  This was in February 2023.  She was diagnosed with atrophic vaginitis and treated with estrogen vaginal cream.  Provider was Dr. Candice Camp. ? ? ? ? ?Most recent functional status assessment: ? ?  09/15/2021  ? 11:05 AM  ?In your present state of health, do you have any difficulty performing the following activities:  ?Hearing? 0  ?Vision? 0  ?Difficulty concentrating or making decisions? 0  ?Walking or climbing stairs? 0  ?Dressing or bathing? 0  ?Doing errands, shopping? 0  ?Preparing Food and eating ? N  ?Using the Toilet? N  ?In the past six months, have you accidently leaked urine? N  ?Do you have problems with loss of bowel control? N  ?Managing your Medications? N  ?Managing your Finances? N  ?Housekeeping  or managing your Housekeeping? N  ? ?Most recent fall risk assessment: ? ?  09/15/2021  ? 11:04 AM  ?Fall Risk   ?Falls in the past year? 0  ?Number falls in past yr: 0  ?Injury with Fall? 0  ?Risk for fall due to : No Fall Risks  ?Follow up Falls evaluation completed  ? ? Most recent depression screenings: ? ?  09/15/2021  ? 11:05 AM 03/17/2021  ?  9:57 AM  ?PHQ 2/9 Scores  ?PHQ - 2 Score 0 0  ? ?Most recent cognitive screening: ? ?  09/15/2021  ? 11:05 AM  ?6CIT Screen  ?What Year? 0 points  ?What month? 0 points  ?What time? 0 points  ?Count back from 20 0 points  ?Months in reverse 0 points  ?Repeat phrase 0 points  ?Total Score 0 points  ? ? ? ? ? Assessment & Plan  ?History of thalamic stroke 2014 with persistent left upper extremity numbness and tightness ? ?History of migraine headaches ? ?History of essential tremor which is very mild ? ?History of constipation treated with MiraLAX ? ?History of left retinal detachment ? ?Hyperlipidemia-currently on statin Zocor 20 mg daily and lipids are normal ? ?Essential hypertension stable on amlodipine and losartan. ? ? ?Fasting glucose is 102 but hemoglobin A1c is normal.  Lipids are normal.  CBC is normal.  Mammogram and colonoscopy are up-to-date.  Received pneumococcal 20 vaccine today.  Tetanus immunization up-to-date.  Has had Shingrix vaccines.  Has had 3 COVID vaccines in 2021 with no boosters documented. ? ?Plan: She will return in 6 months for follow-up.  Serum potassium repeated and was normal.  Was initially 5.5 on March 27 and this was thought to be due to a lab error. ? ? ?  ? ?Annual wellness visit done today including the all of the following: ?Reviewed patient's Family Medical History ?Reviewed and updated list of patient's medical providers ?Assessment of cognitive impairment was done ?Assessed patient's functional ability ?Established a written schedule for health screening services ?Health Risk Assessent Completed and Reviewed ? ?Discussed health  benefits of physical activity, and encouraged her to engage in regular exercise appropriate for her age and condition.  ?  ? ? ?  ? ?{I, Margaree Mackintosh, MD, have reviewed all documentation for this visit. The documentation on 09/15/21 for the exam, diagnosis, procedures, and orders are all accurate and complete. ? ? ?Jama Flavors, CMA  ?

## 2021-09-18 ENCOUNTER — Other Ambulatory Visit: Payer: Self-pay | Admitting: Internal Medicine

## 2021-10-15 NOTE — Patient Instructions (Addendum)
Serum potassium rechecked and is normal.  Continue current medications and follow-up in 6 months.  It was a pleasure to see you today.  Pneumococcal 20 vaccine given. ?

## 2021-11-23 ENCOUNTER — Other Ambulatory Visit: Payer: Self-pay | Admitting: Internal Medicine

## 2021-12-09 ENCOUNTER — Other Ambulatory Visit: Payer: Self-pay | Admitting: Internal Medicine

## 2021-12-19 ENCOUNTER — Other Ambulatory Visit: Payer: Self-pay | Admitting: Internal Medicine

## 2021-12-19 DIAGNOSIS — Z1231 Encounter for screening mammogram for malignant neoplasm of breast: Secondary | ICD-10-CM

## 2021-12-20 ENCOUNTER — Other Ambulatory Visit: Payer: Self-pay | Admitting: Internal Medicine

## 2022-01-23 ENCOUNTER — Ambulatory Visit
Admission: RE | Admit: 2022-01-23 | Discharge: 2022-01-23 | Disposition: A | Discharge status: Home / Self Care | Payer: Medicare Other | Source: Ambulatory Visit | Attending: Internal Medicine | Admitting: Internal Medicine

## 2022-01-23 DIAGNOSIS — Z1231 Encounter for screening mammogram for malignant neoplasm of breast: Secondary | ICD-10-CM

## 2022-01-29 ENCOUNTER — Other Ambulatory Visit: Payer: Self-pay | Admitting: Internal Medicine

## 2022-02-07 DIAGNOSIS — H2513 Age-related nuclear cataract, bilateral: Secondary | ICD-10-CM | POA: Diagnosis not present

## 2022-02-07 DIAGNOSIS — H524 Presbyopia: Secondary | ICD-10-CM | POA: Diagnosis not present

## 2022-02-07 DIAGNOSIS — H43813 Vitreous degeneration, bilateral: Secondary | ICD-10-CM | POA: Diagnosis not present

## 2022-02-07 DIAGNOSIS — H16421 Pannus (corneal), right eye: Secondary | ICD-10-CM | POA: Diagnosis not present

## 2022-02-07 DIAGNOSIS — H353131 Nonexudative age-related macular degeneration, bilateral, early dry stage: Secondary | ICD-10-CM | POA: Diagnosis not present

## 2022-02-22 ENCOUNTER — Telehealth: Payer: Self-pay | Admitting: Internal Medicine

## 2022-02-22 ENCOUNTER — Encounter: Payer: Self-pay | Admitting: Internal Medicine

## 2022-02-22 MED ORDER — BUTALBITAL-APAP-CAFFEINE 50-325-40 MG PO TABS
ORAL_TABLET | ORAL | 0 refills | Status: DC
Start: 2022-02-22 — End: 2022-06-29

## 2022-02-22 NOTE — Telephone Encounter (Signed)
Call in Fioricet refill to new pharmacy #[id number] tabs with no refills. Will be using new pharmacy in this note.MJB, MD

## 2022-02-22 NOTE — Telephone Encounter (Signed)
Tracie Roach [phone]  [name] called to see if she could get a refill on below medication, she said it had been a rough week, sent to new pharmacy.  butalbital-acetaminophen-caffeine (FIORICET) 50-325-40 MG tablet  [facility] [phone] - [place], [place] - [address] AT [facility] [address] Phone:  [phone]  Fax:  [phone]

## 2022-03-03 DIAGNOSIS — M25562 Pain in left knee: Secondary | ICD-10-CM | POA: Diagnosis not present

## 2022-03-03 DIAGNOSIS — M545 Low back pain, unspecified: Secondary | ICD-10-CM | POA: Diagnosis not present

## 2022-03-10 ENCOUNTER — Encounter: Payer: Self-pay | Admitting: Internal Medicine

## 2022-03-10 ENCOUNTER — Ambulatory Visit (INDEPENDENT_AMBULATORY_CARE_PROVIDER_SITE_OTHER): Payer: Medicare Other | Admitting: Internal Medicine

## 2022-03-10 ENCOUNTER — Telehealth: Payer: Self-pay | Admitting: Internal Medicine

## 2022-03-10 ENCOUNTER — Other Ambulatory Visit: Payer: Medicare Other

## 2022-03-10 VITALS — BP 126/70 | HR 92 | Temp 99.8°F

## 2022-03-10 DIAGNOSIS — R7301 Impaired fasting glucose: Secondary | ICD-10-CM

## 2022-03-10 DIAGNOSIS — I10 Essential (primary) hypertension: Secondary | ICD-10-CM | POA: Diagnosis not present

## 2022-03-10 DIAGNOSIS — Z8669 Personal history of other diseases of the nervous system and sense organs: Secondary | ICD-10-CM

## 2022-03-10 DIAGNOSIS — E7800 Pure hypercholesterolemia, unspecified: Secondary | ICD-10-CM

## 2022-03-10 DIAGNOSIS — Z79899 Other long term (current) drug therapy: Secondary | ICD-10-CM | POA: Diagnosis not present

## 2022-03-10 DIAGNOSIS — I6930 Unspecified sequelae of cerebral infarction: Secondary | ICD-10-CM

## 2022-03-10 MED ORDER — HYDROCODONE BIT-HOMATROP MBR 5-1.5 MG/5ML PO SOLN: 5.0000 mL | Freq: Three times a day (TID) | ORAL | 0 refills | Status: DC | PRN

## 2022-03-10 MED ORDER — FLUCONAZOLE 150 MG PO TABS: 150.0000 mg | ORAL_TABLET | Freq: Once | ORAL | 0 refills | Status: AC

## 2022-03-10 MED ORDER — AMOXICILLIN-POT CLAVULANATE 500-125 MG PO TABS: 1.0000 | ORAL_TABLET | Freq: Two times a day (BID) | ORAL | 0 refills | Status: DC

## 2022-03-10 NOTE — Progress Notes (Signed)
   Subjective:    Patient ID: Tracie Roach, female    DOB: 09-24-1955, 66 y.o.   MRN: [id number]  HPI 66  year old Female with runny nose and scratchy throat. Mowed grass for 4 hours recently then head stopped up.  Has developed cough, malaise and fatigue.  Has taken a couple of COVID test which were negative.  She came in today for fasting labs in conjunction with an upcoming office visit in remarked to receptionist that she was ill with a respiratory infection.  An appointment was arranged.  Has thick light yellow sputum some of which is discolored.  Has several grandchildren who intermittently have respiratory infections.  Her mother passed away recently.  She has a remote history of stroke, hypertension, hyperlipidemia, anxiety, allergic rhinitis history of right infrahilar pneumonia October 2022.  Has had pneumococcal 20 vaccine in March 2023.  Review of Systems Orthopedist prescribed methylprednisolone on Sept 16 . It was a 6 day dosepack.     Objective:   Physical Exam  T 99.8 degrees  TMS are clear.  Pharynx is slightly injected.  Neck supple.  Chest clear to auscultation.  She looks a bit fatigued.        Assessment & Plan:  Acute lower respiratory infection  Plan: Augmentin 500 mg twice daily for 10 days.  Hycodan 1 teaspoon every 8 hours as needed for cough.  May take Diflucan if needed for Candida vaginitis while on antibiotics. quarantine at home.  Rest and  drink plenty of fluids.  Keep upcoming appointment for September 28 if much improved.

## 2022-03-10 NOTE — Patient Instructions (Signed)
Take Augmentin 500 mg twice daily for 10 days.  Hycodan 1 teaspoon every 8 hours as needed for cough.  Quarantine at home for few days.  Rest and drink plenty of fluids.  Call if not improving within 48 hours or sooner if worse.

## 2022-03-10 NOTE — Telephone Encounter (Signed)
Tracie Roach [phone]  Ahnesti came in for labs and has cough, chest congestion, she done COVID test yesterday it was negative. This started on Tuesday with a little scratchy throat with some drainage, that is gone and she now has the rest. NO fever, body aches or sneezing.Scheduled her to come back at 2:30 fo OV

## 2022-03-11 LAB — COMPLETE METABOLIC PANEL WITH GFR
AG Ratio: 1.5 (calc) (ref 1.0–2.5)
ALT: 18 U/L (ref 6–29)
AST: 19 U/L (ref 10–35)
Albumin: 4.3 g/dL (ref 3.6–5.1)
Alkaline phosphatase (APISO): 126 U/L (ref 37–153)
BUN: 17 mg/dL (ref 7–25)
CO2: 30 mmol/L (ref 20–32)
Calcium: 9.7 mg/dL (ref 8.6–10.4)
Chloride: 101 mmol/L (ref 98–110)
Creat: 0.93 mg/dL (ref 0.50–1.05)
Globulin: 2.9 g/dL (calc) (ref 1.9–3.7)
Glucose, Bld: 86 mg/dL (ref 65–99)
Potassium: 4.7 mmol/L (ref 3.5–5.3)
Sodium: 141 mmol/L (ref 135–146)
Total Bilirubin: 0.5 mg/dL (ref 0.2–1.2)
Total Protein: 7.2 g/dL (ref 6.1–8.1)
eGFR: 68 mL/min/{1.73_m2} (ref >=60)

## 2022-03-11 LAB — VITAMIN D 25 HYDROXY (VIT D DEFICIENCY, FRACTURES): Vit D, 25-Hydroxy: 74 ng/mL (ref 30–100)

## 2022-03-11 LAB — HEMOGLOBIN A1C
Hgb A1c MFr Bld: 5.4 % of total Hgb (ref <5.7)
Mean Plasma Glucose: 108 mg/dL
eAG (mmol/L): 6 mmol/L

## 2022-03-11 LAB — LIPID PANEL
Cholesterol: 165 mg/dL (ref <200)
HDL: 57 mg/dL (ref >=50)
LDL Cholesterol (Calc): 89 mg/dL (calc)
Non-HDL Cholesterol (Calc): 108 mg/dL (calc) (ref <130)
Total CHOL/HDL Ratio: 2.9 (calc) (ref <5.0)
Triglycerides: 98 mg/dL (ref <150)

## 2022-03-13 DIAGNOSIS — M25562 Pain in left knee: Secondary | ICD-10-CM | POA: Diagnosis not present

## 2022-03-16 ENCOUNTER — Ambulatory Visit (INDEPENDENT_AMBULATORY_CARE_PROVIDER_SITE_OTHER): Payer: Medicare Other | Admitting: Internal Medicine

## 2022-03-16 ENCOUNTER — Encounter: Payer: Self-pay | Admitting: Internal Medicine

## 2022-03-16 VITALS — BP 124/70 | HR 80 | Temp 98.7°F | Ht 66.0 in | Wt 161.8 lb

## 2022-03-16 DIAGNOSIS — I6930 Unspecified sequelae of cerebral infarction: Secondary | ICD-10-CM

## 2022-03-16 DIAGNOSIS — E7800 Pure hypercholesterolemia, unspecified: Secondary | ICD-10-CM | POA: Diagnosis not present

## 2022-03-16 DIAGNOSIS — J309 Allergic rhinitis, unspecified: Secondary | ICD-10-CM

## 2022-03-16 DIAGNOSIS — I10 Essential (primary) hypertension: Secondary | ICD-10-CM

## 2022-03-16 DIAGNOSIS — Z13820 Encounter for screening for osteoporosis: Secondary | ICD-10-CM

## 2022-03-16 DIAGNOSIS — Z8669 Personal history of other diseases of the nervous system and sense organs: Secondary | ICD-10-CM

## 2022-03-16 DIAGNOSIS — Z23 Encounter for immunization: Secondary | ICD-10-CM | POA: Diagnosis not present

## 2022-03-16 NOTE — Progress Notes (Signed)
   Subjective:    Patient ID: Tracie Roach, female    DOB: 1956-06-17, 66 y.o.   MRN: [id number]  HPI 66 year old Female for 6 month recheck.   Had knee strain and saw Dr.  Fredonia Highland. Is wearing a knee brace. Took a prednisone taper and has been prescribed Naprosyn which she really has not tried yet. Told her it was fine to try it.  Labs reviewed.  Her vitamin D level is normal at 74.  Her lipid panel is completely normal on simvastatin 20 mg daily.  C-Met is normal including BUN and creatinine.  Hemoglobin A1c is excellent at 5.4%.  Her mother passed away recently and she is involved and settling the estate.  Some situational stress with that that was discussed today.  Have recommended COVID booster.  Flu vaccine given here today.  She has a history of migraine headaches longstanding treated with Fioricet.  History of hypertension treated with amlodipine, metoprolol, and losartan.  Takes Xanax for situational stress as needed.  Mammogram and bone density ordered through mobile mammography  Had colonoscopy in 2018 by Dr. Carlean Purl with 10-year follow-up recommended.  Review of Systems no new complaints  She had [facility] exam in March 2023.  Has not wanted to get COVID booster since 2021.  Not sure she will take new booster.  She has had Shingrix series and tetanus immunization is up-to-date.  Had pneumococcal 20 vaccine in March 2023.  Given flu vaccine today.     Objective:   Physical Exam Blood pressure 124/70, pulse 80 regular temperature 98.7 degrees pulse oximetry 98% weight 161 pounds 12.8 ounces BMI 26.12 height 5 feet 6 inches.  In March her weight was 167 pounds and BMI was 26.95.  She gets regular exercise through yard work and activities about her home. Neck is supple without JVD thyromegaly or carotid bruits.  Chest clear.  Cardiac exam: Regular rate and rhythm.  No lower extremity pitting edema.      Assessment & Plan:   It appears that she has mobile  mammogram and bone density study scheduled for the fall.  Vitamin D level is excellent at 74.  Has not had bone density study in the past.  She is postmenopausal.  For anxiety she will continue with Xanax sparingly  For migraine headaches she will continue with Fioricet sparingly  Situational stress with mother's death recently an issue with her sister  History of CVA  Essential hypertension stable on Norvasc and losartan as well as metoprolol  Hyperlipidemia treated with Zocor generic

## 2022-03-16 NOTE — Patient Instructions (Addendum)
Recommend bone density study.  Order placed for mobile mammogram and bone density.  Vaccines discussed.  Flu vaccine given today.  Other vaccines discussed- due to having grandchildren needs RSV-  but she says she may not take Covid booster.  Has had pneumococcal 20.  No change in medications.  Medicare wellness visit scheduled for April 2024.

## 2022-03-18 IMAGING — MG DIGITAL SCREENING BILAT W/ CAD
4 series · 4 of 4 positions shown · non-contrast
Comparison: Previous exam(s).

CLINICAL DATA: Screening.

EXAM:
DIGITAL SCREENING BILATERAL MAMMOGRAM WITH CAD
TECHNIQUE: Bilateral screening digital craniocaudal and mediolateral oblique
mammograms were obtained. The images were evaluated with
computer-aided detection.

[R MLO]
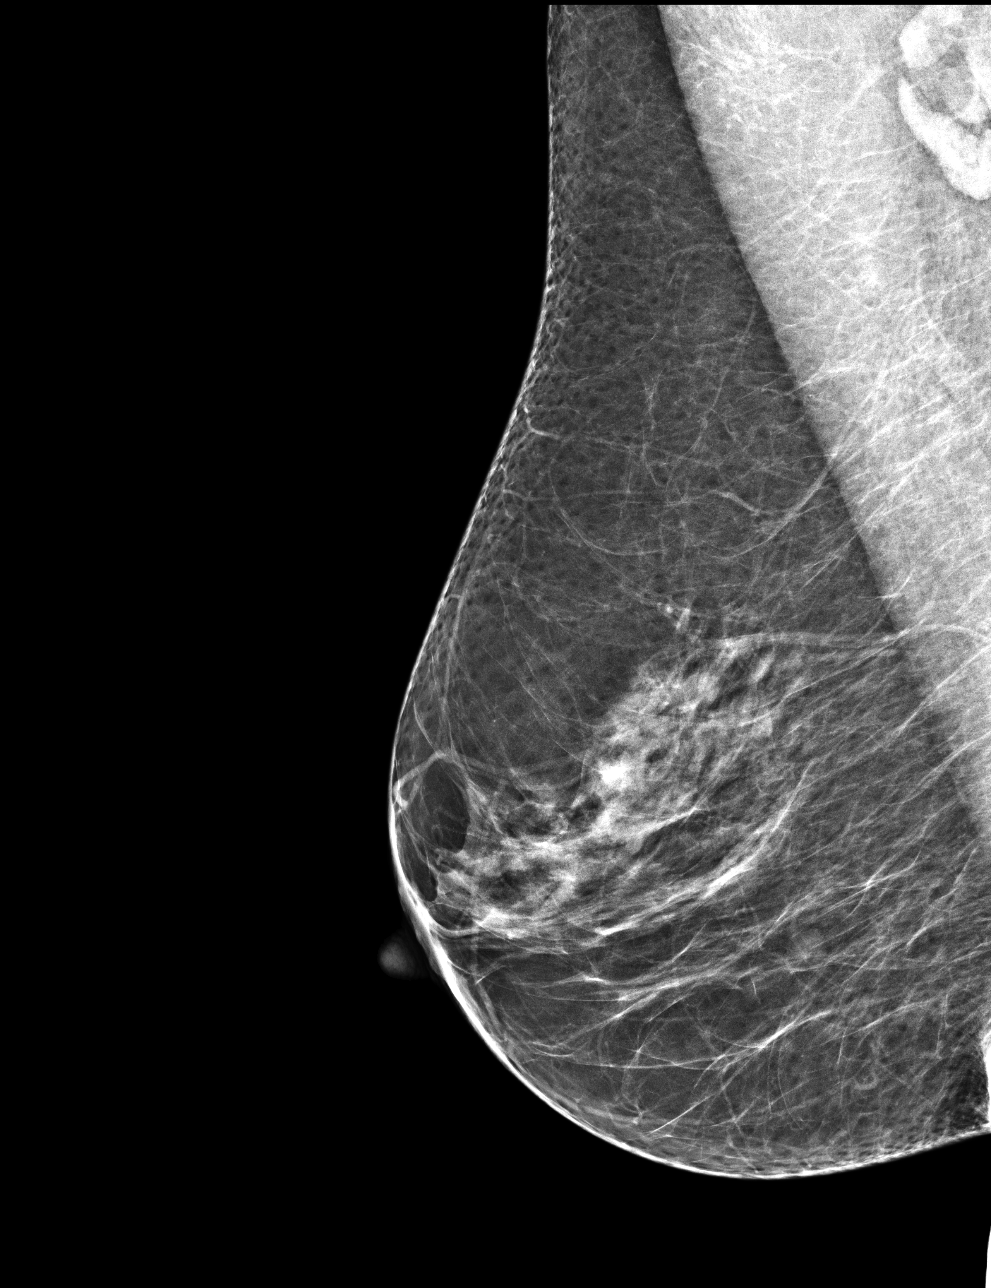

[L CC]
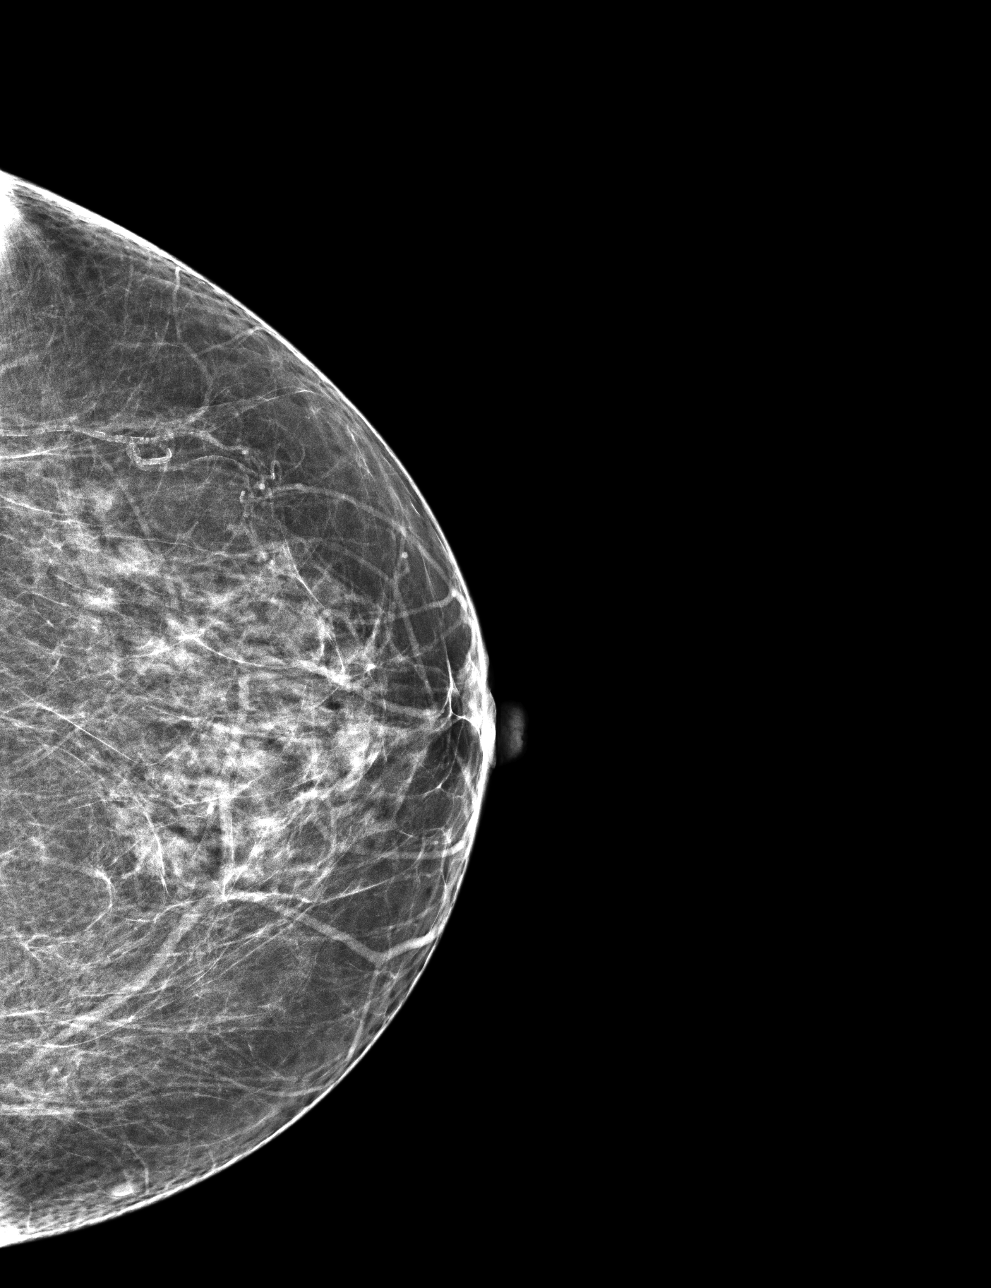

[R CC]
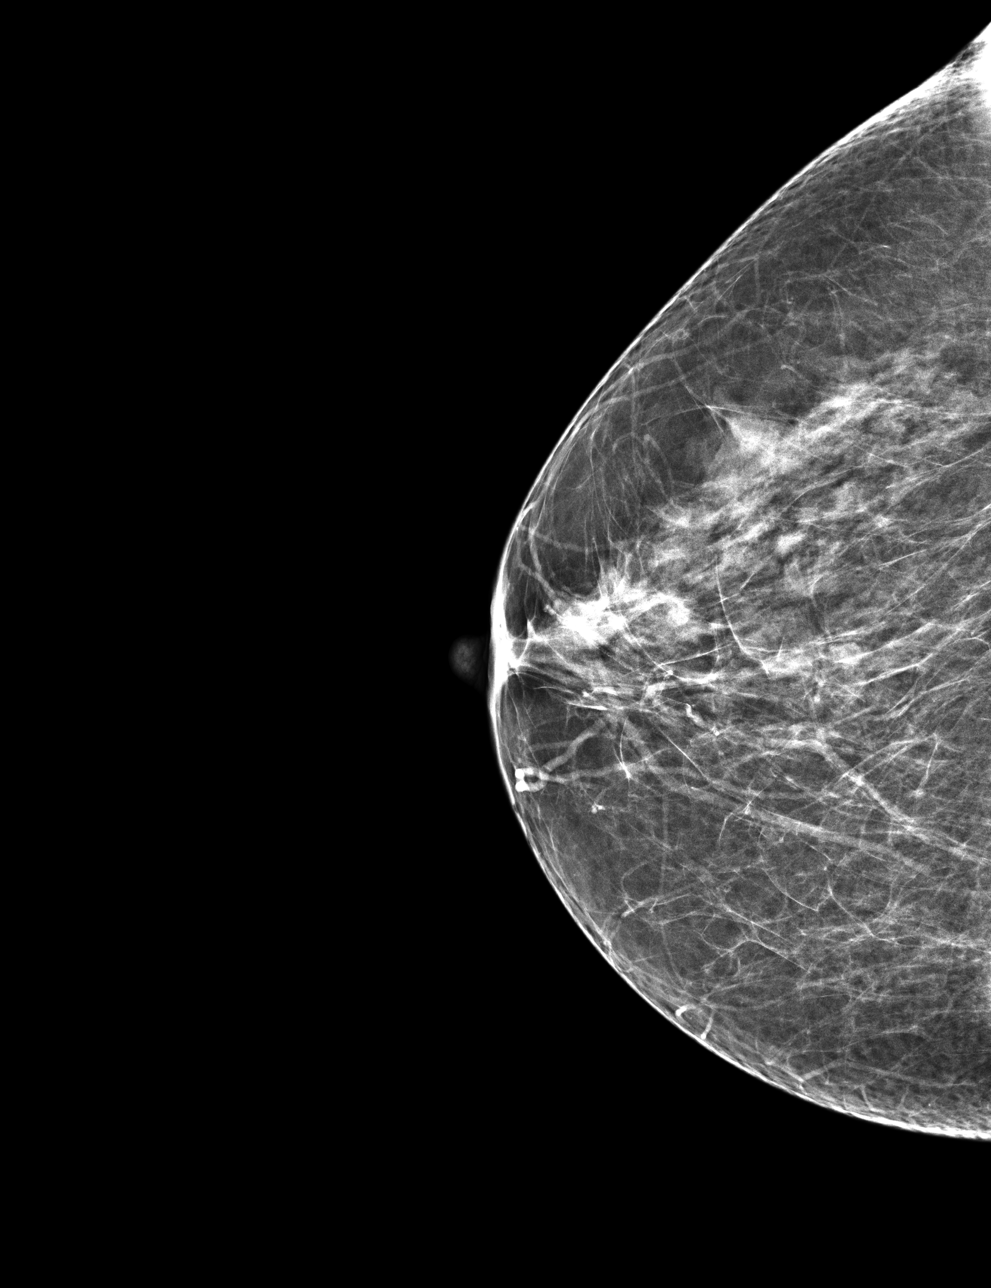

[L MLO]
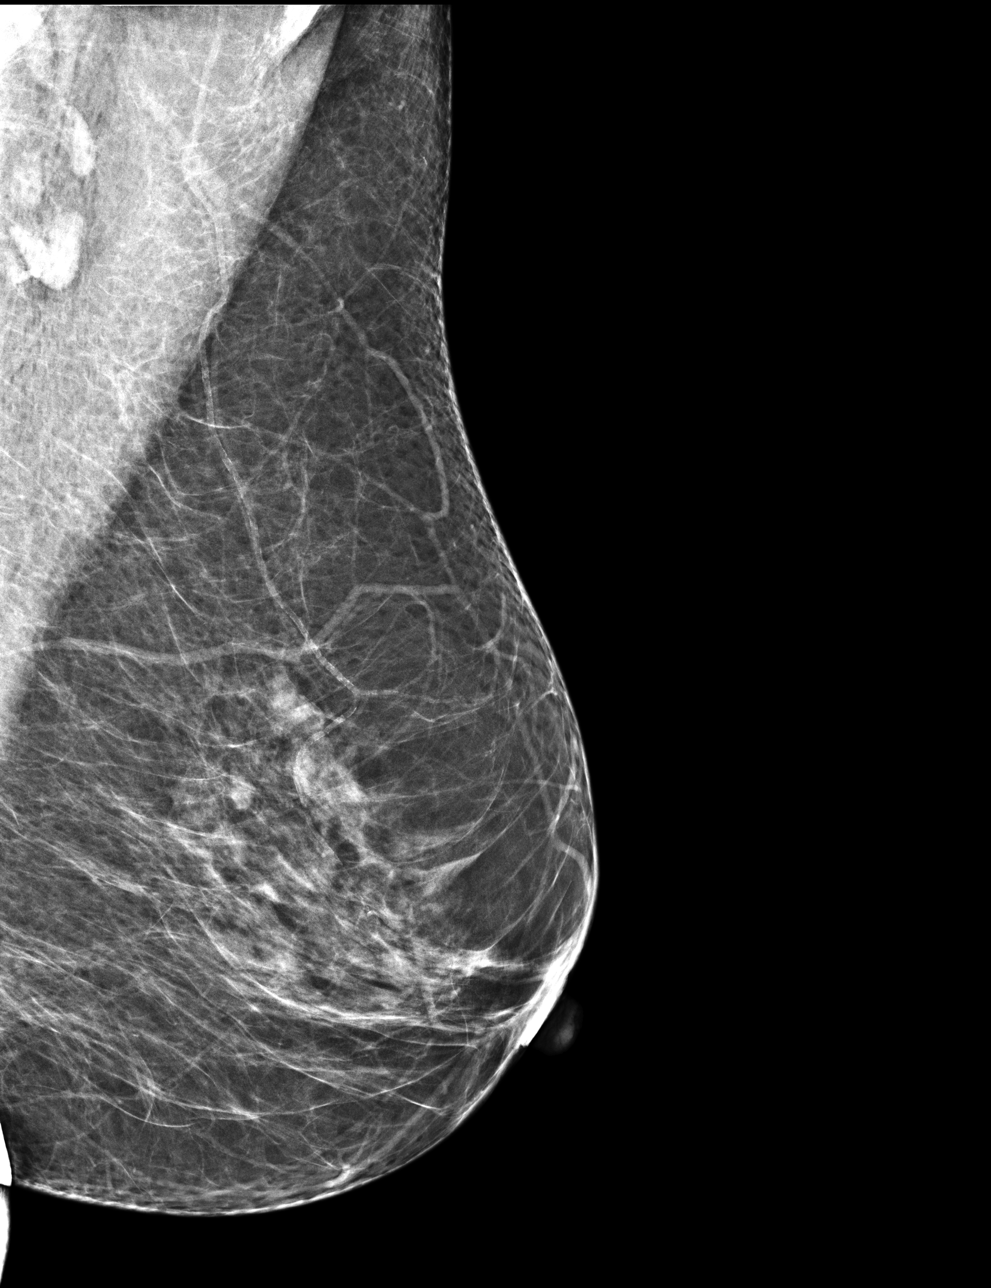

[4 of 4 positions shown; findings below may reference images not displayed]

ACR Breast Density Category b: There are scattered areas of
fibroglandular density.
FINDINGS: There are no findings suspicious for malignancy.
IMPRESSION: No mammographic evidence of malignancy. A result letter of this
screening mammogram will be mailed directly to the patient.

RECOMMENDATION:
Screening mammogram in one year. (Code:[id number]-V-ZRK)

BI-RADS CATEGORY  1: Negative.

## 2022-03-22 ENCOUNTER — Ambulatory Visit
Admission: RE | Admit: 2022-03-22 | Discharge: 2022-03-22 | Disposition: A | Discharge status: Home / Self Care | Payer: Medicare Other | Source: Ambulatory Visit | Attending: Internal Medicine | Admitting: Internal Medicine

## 2022-03-22 DIAGNOSIS — M8589 Other specified disorders of bone density and structure, multiple sites: Secondary | ICD-10-CM | POA: Diagnosis not present

## 2022-03-22 DIAGNOSIS — Z780 Asymptomatic menopausal state: Secondary | ICD-10-CM | POA: Diagnosis not present

## 2022-03-22 DIAGNOSIS — Z13820 Encounter for screening for osteoporosis: Secondary | ICD-10-CM

## 2022-03-28 ENCOUNTER — Telehealth: Payer: Self-pay | Admitting: Internal Medicine

## 2022-03-28 NOTE — Telephone Encounter (Signed)
Received Fax RX request from  [facility] [place], [place] AT [place] Phone:  [phone]  Fax:  [phone]      Medication - losartan (COZAAR) 100 MG tablet  Last Refill - 12/28/2021  Last OV - 03/16/22  Last CPE - 09/15/21  Next Appointment - 09/21/22

## 2022-03-29 MED ORDER — LOSARTAN POTASSIUM 100 MG PO TABS: 100.0000 mg | ORAL_TABLET | Freq: Every day | ORAL | 3 refills | Status: DC

## 2022-03-29 NOTE — Addendum Note (Signed)
Addended by: Geradine Girt D on: 03/29/2022 08:48 AM   Modules accepted: Orders

## 2022-04-03 ENCOUNTER — Other Ambulatory Visit: Payer: Self-pay | Admitting: Internal Medicine

## 2022-04-03 ENCOUNTER — Telehealth: Payer: Self-pay | Admitting: Internal Medicine

## 2022-04-03 ENCOUNTER — Encounter: Payer: Self-pay | Admitting: Internal Medicine

## 2022-04-03 MED ORDER — ALPRAZOLAM 0.5 MG PO TABS: ORAL_TABLET | ORAL | 1 refills | Status: DC

## 2022-04-03 NOTE — Telephone Encounter (Signed)
Xanax refilled as requested. MJB, MD

## 2022-04-03 NOTE — Telephone Encounter (Signed)
Received Fax RX request from  [facility], [address] AT [place] Phone: [phone]  Fax: [phone]      Medication -  ALPRAZolam Duanne Moron) 0.5 MG tablet   Last Refill - 12/28/2021  Last OV - 03/16/22  Last CPE - 09/15/21  Next Appointment - 09/21/22

## 2022-04-04 NOTE — Addendum Note (Signed)
Addended by: Daneille Desilva D on: 04/04/2022 04:16 PM   Modules accepted: Orders  

## 2022-04-13 ENCOUNTER — Other Ambulatory Visit: Payer: Medicare Other

## 2022-05-15 ENCOUNTER — Telehealth: Payer: Self-pay | Admitting: Internal Medicine

## 2022-05-15 NOTE — Telephone Encounter (Signed)
Tracie Roach called to say she had an RSV injection on 05/05/2022 at [facility]

## 2022-05-16 ENCOUNTER — Other Ambulatory Visit: Payer: Self-pay

## 2022-05-16 MED ORDER — AMITRIPTYLINE HCL 10 MG PO TABS: ORAL_TABLET | ORAL | 5 refills | Status: DC

## 2022-05-24 IMAGING — CR DG CHEST 2V
2 series · 2 of 2 positions shown · non-contrast
Comparison: None.

CLINICAL DATA: Cough for 1.5 weeks, left-sided chest pain, previous
tobacco abuse

EXAM:
CHEST - 2 VIEW

[w chest pa]
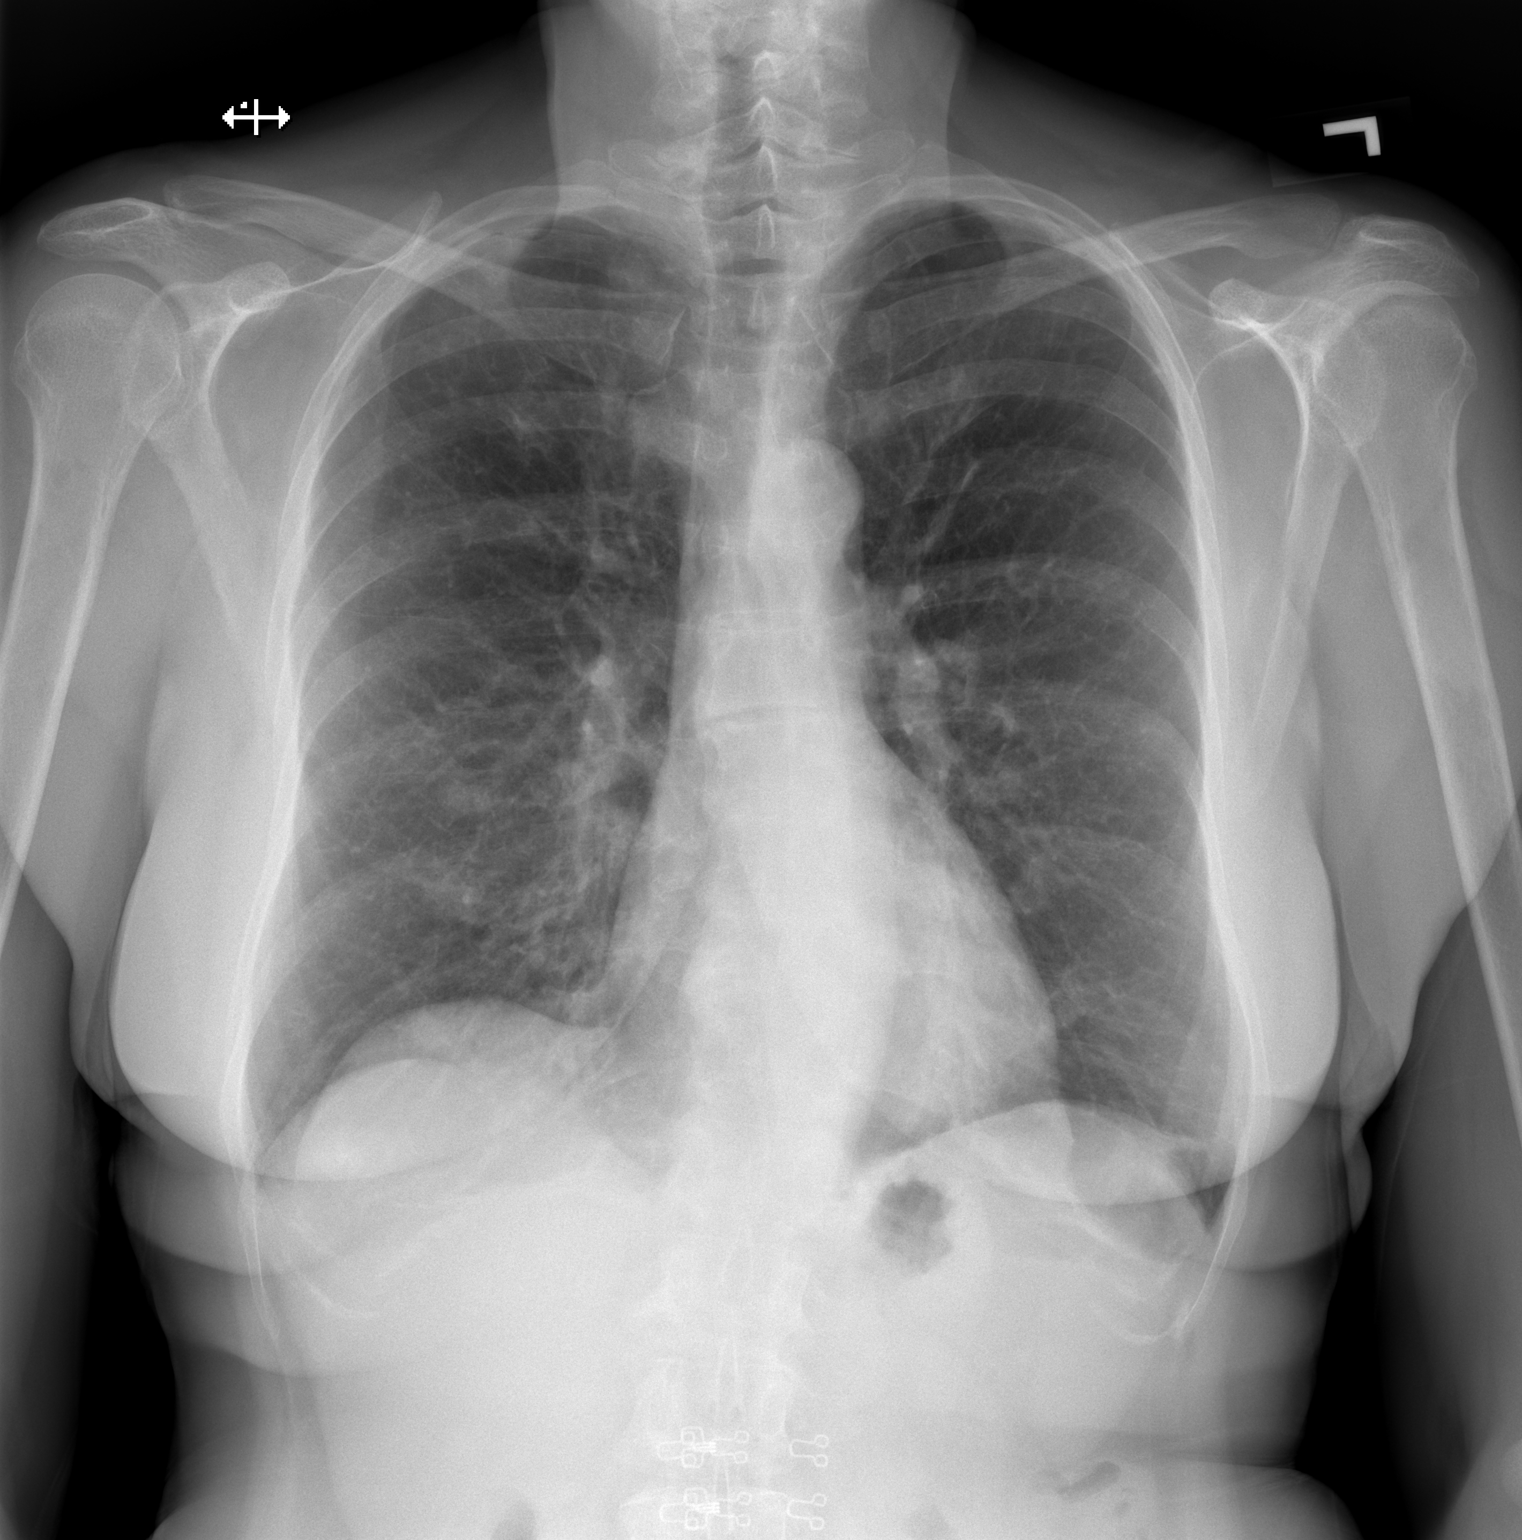

[w chest lat]
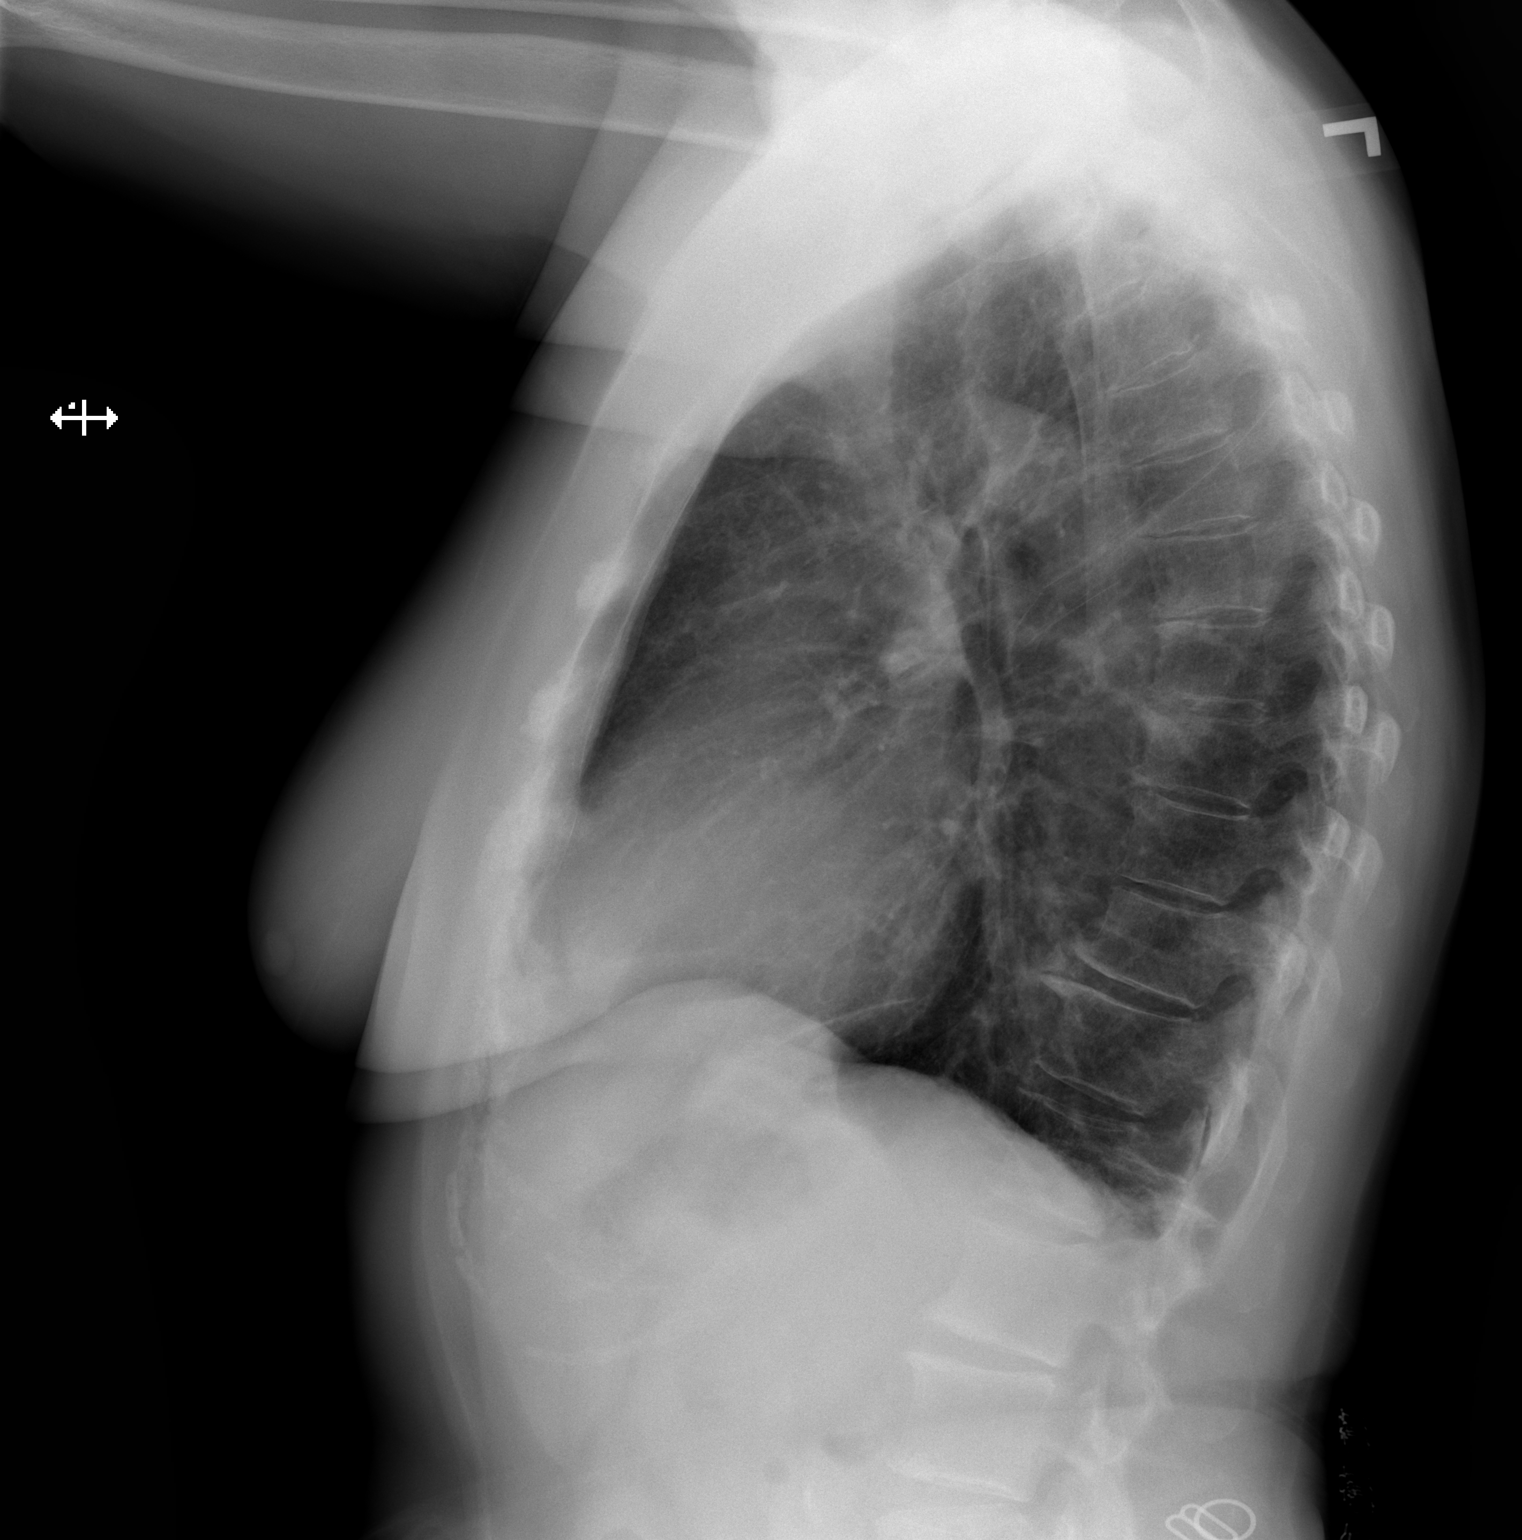

[2 of 2 positions shown; findings below may reference images not displayed]

FINDINGS: Frontal and lateral views of the chest demonstrate an unremarkable
cardiac silhouette. Diffuse interstitial prominence most compatible
with given history of tobacco abuse. There may be patchy right
infrahilar airspace disease compatible with pneumonia. No effusion
or pneumothorax. No acute bony abnormalities.
IMPRESSION: 1. Diffuse interstitial prominence, likely due to history of tobacco
abuse.
2. Probable right infrahilar patchy bronchopneumonia.

## 2022-06-14 ENCOUNTER — Other Ambulatory Visit: Payer: Self-pay

## 2022-06-14 MED ORDER — LOSARTAN POTASSIUM 100 MG PO TABS: 100.0000 mg | ORAL_TABLET | Freq: Every day | ORAL | 3 refills | Status: DC

## 2022-06-29 ENCOUNTER — Other Ambulatory Visit: Payer: Self-pay | Admitting: Internal Medicine

## 2022-09-14 ENCOUNTER — Other Ambulatory Visit: Payer: Medicare Other

## 2022-09-14 DIAGNOSIS — E7800 Pure hypercholesterolemia, unspecified: Secondary | ICD-10-CM

## 2022-09-14 DIAGNOSIS — R5383 Other fatigue: Secondary | ICD-10-CM

## 2022-09-14 DIAGNOSIS — I10 Essential (primary) hypertension: Secondary | ICD-10-CM

## 2022-09-14 NOTE — Progress Notes (Signed)
Annual Wellness Visit    Patient Care Team: Elby Showers, MD as PCP - General (Internal Medicine)  Visit Date: 09/21/22   No chief complaint on file.   Subjective:   Patient: Tracie Roach, Female    DOB: 02/16/1956, 67 y.o.   MRN: [id number]    Past Medical History:  Diagnosis Date   Allergy    Anxiety    Arthritis    Cataract    beginnings of    Essential tremor    Functional constipation    Heart murmur    Hyperlipidemia    Hypertension    Stroke Oct 25, 2012     Family History  Problem Relation Age of Onset   Heart disease Mother    Colon cancer Neg Hx    Colon polyps Neg Hx    Esophageal cancer Neg Hx    Rectal cancer Neg Hx    Stomach cancer Neg Hx    Breast cancer Neg Hx      Social History   Social History Narrative          Social history: She is married with 2 adult children.  Several grandchildren.  Does not smoke or consume alcohol.  Quit smoking in March 2013.  Used to smoke 6 or 7 cigarettes daily.  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 Patient lives with husband Richardson Landry. Patient has 2 years of community college.       Family history: Father died in 2007/10/26 with pulmonary fibrosis.  Mother is living but doing poorly with history of CABG and valve replacement.  1 sister in good health.  No brothers.     ROS    Objective:   Vitals: BP 100/60   Pulse 83   Temp 98.4 F (36.9 C) (Tympanic)   SpO2 97%   Physical Exam Vitals and nursing note reviewed.  Constitutional:      General: She is not in acute distress.    Appearance: Normal appearance. She is not toxic-appearing.  HENT:     Head: Normocephalic and atraumatic.     Right Ear: Hearing, ear canal and external ear normal.     Left Ear: Hearing, tympanic membrane, ear canal and external ear normal.     Ears:     Comments: Right TM full with splayed light reflex.    Mouth/Throat:     Pharynx: No oropharyngeal exudate.     Comments:  Pharynx injected without exudate. Pulmonary:     Effort: Pulmonary effort is normal. No respiratory distress.     Breath sounds: Wheezing present. No rales.     Comments: Inspiratory wheezing throughout lung fields. Skin:    General: Skin is warm and dry.  Neurological:     Mental Status: She is alert and oriented to person, place, and time. Mental status is at baseline.  Psychiatric:        Mood and Affect: Mood normal.        Behavior: Behavior normal.        Thought Content: Thought content normal.        Judgment: Judgment normal.      Most recent functional status assessment:     No data to display         Most recent fall risk assessment:    09/21/2022   10:14 AM  [place] in the past year? 0  Number falls in past yr:  0  Injury with Fall? 0  Risk for fall due to : No Fall Risks  Follow up Falls prevention discussed    Most recent depression screenings:    09/21/2022   10:14 AM 03/16/2022   10:35 AM  PHQ 2/9 Scores  PHQ - 2 Score 0 0   Most recent cognitive screening:    09/15/2021   11:05 AM  6CIT Screen  What Year? 0 points  What month? 0 points  What time? 0 points  Count back from 20 0 points  Months in reverse 0 points  Repeat phrase 0 points  Total Score 0 points     Results:   Studies obtained and personally reviewed by me:     Labs:       Component Value Date/Time   NA 143 09/14/2022 0922   K 4.6 09/14/2022 0922   CL 104 09/14/2022 0922   CO2 31 09/14/2022 0922   GLUCOSE 97 09/14/2022 0922   BUN 13 09/14/2022 0922   CREATININE 0.81 09/14/2022 0922   CALCIUM 9.5 09/14/2022 0922   PROT 7.0 09/14/2022 0922   ALBUMIN 4.3 02/20/2017 1055   AST 19 09/14/2022 0922   ALT 17 09/14/2022 0922   ALKPHOS 114 02/20/2017 1055   BILITOT 0.4 09/14/2022 0922   GFRNONAA 66 09/09/2020 0905   GFRAA 76 09/09/2020 0905     Lab Results  Component Value Date   WBC 7.3 09/14/2022   HGB 13.4 09/14/2022   HCT 40.5 09/14/2022   MCV 91.2  09/14/2022   PLT 354 09/14/2022    Lab Results  Component Value Date   CHOL 149 09/14/2022   HDL 55 09/14/2022   LDLCALC 79 09/14/2022   TRIG 73 09/14/2022   CHOLHDL 2.7 09/14/2022    Lab Results  Component Value Date   HGBA1C 5.4 03/10/2022        Assessment & Plan:   Acute lower respiratory infection-rule out pneumonia  Plan: Patient to have chest x-ray.  Was given 1 g IM Rocephin and started on Augmentin for 10 days.  May take Hycodan sparingly if needed for cough and was given Diflucan if needed for Candida vaginitis while on antibiotics.  Will need to reschedule Medicare wellness visit at another time.  Addendum: Chest x-ray shows patchy opacities in left lateral base that may reflect atelectasis or pneumonia.  She will let me know if not improving within 24 to 48 hours.      Annual wellness visit done today including the all of the following: Reviewed patient's Family Medical History Reviewed and updated list of patient's medical providers Assessment of cognitive impairment was done Assessed patient's functional ability Established a written schedule for health screening services Health Risk Assessent Completed and Reviewed  Discussed health benefits of physical activity, and encouraged her to engage in regular exercise appropriate for her age and condition.        I,Alexander Ruley,acting as a Neurosurgeon for Margaree Mackintosh, MD.,have documented all relevant documentation on the behalf of Margaree Mackintosh, MD,as directed by  Margaree Mackintosh, MD while in the presence of Margaree Mackintosh, MD.   I, Margaree Mackintosh, MD, have reviewed all documentation for this visit. The documentation on 10/01/22 for the exam, diagnosis, procedures, and orders are all accurate and complete.

## 2022-09-15 LAB — COMPLETE METABOLIC PANEL WITH GFR
AG Ratio: 1.4 (calc) (ref 1.0–2.5)
ALT: 17 U/L (ref 6–29)
AST: 19 U/L (ref 10–35)
Albumin: 4.1 g/dL (ref 3.6–5.1)
Alkaline phosphatase (APISO): 121 U/L (ref 37–153)
BUN: 13 mg/dL (ref 7–25)
CO2: 31 mmol/L (ref 20–32)
Calcium: 9.5 mg/dL (ref 8.6–10.4)
Chloride: 104 mmol/L (ref 98–110)
Creat: 0.81 mg/dL (ref 0.50–1.05)
Globulin: 2.9 g/dL (calc) (ref 1.9–3.7)
Glucose, Bld: 97 mg/dL (ref 65–99)
Potassium: 4.6 mmol/L (ref 3.5–5.3)
Sodium: 143 mmol/L (ref 135–146)
Total Bilirubin: 0.4 mg/dL (ref 0.2–1.2)
Total Protein: 7 g/dL (ref 6.1–8.1)
eGFR: 80 mL/min/{1.73_m2} (ref >=60)

## 2022-09-15 LAB — CBC WITH DIFFERENTIAL/PLATELET
Absolute Monocytes: 504 cells/uL (ref 200–950)
Basophils Absolute: 58 cells/uL (ref 0–200)
Basophils Relative: 0.8 %
Eosinophils Absolute: 314 cells/uL (ref 15–500)
Eosinophils Relative: 4.3 %
HCT: 40.5 % (ref 35.0–45.0)
Hemoglobin: 13.4 g/dL (ref 11.7–15.5)
Lymphs Abs: 2183 cells/uL (ref 850–3900)
MCH: 30.2 pg (ref 27.0–33.0)
MCHC: 33.1 g/dL (ref 32.0–36.0)
MCV: 91.2 fL (ref 80.0–100.0)
MPV: 10.5 fL (ref 7.5–12.5)
Monocytes Relative: 6.9 %
Neutro Abs: 4241 cells/uL (ref 1500–7800)
Neutrophils Relative %: 58.1 %
Platelets: 354 10*3/uL (ref 140–400)
RBC: 4.44 10*6/uL (ref 3.80–5.10)
RDW: 11.7 % (ref 11.0–15.0)
Total Lymphocyte: 29.9 %
WBC: 7.3 10*3/uL (ref 3.8–10.8)

## 2022-09-15 LAB — LIPID PANEL
Cholesterol: 149 mg/dL (ref <200)
HDL: 55 mg/dL (ref >=50)
LDL Cholesterol (Calc): 79 mg/dL (calc)
Non-HDL Cholesterol (Calc): 94 mg/dL (calc) (ref <130)
Total CHOL/HDL Ratio: 2.7 (calc) (ref <5.0)
Triglycerides: 73 mg/dL (ref <150)

## 2022-09-15 LAB — TSH: TSH: 3.23 mIU/L (ref 0.40–4.50)

## 2022-09-21 ENCOUNTER — Telehealth: Payer: Self-pay

## 2022-09-21 ENCOUNTER — Ambulatory Visit (INDEPENDENT_AMBULATORY_CARE_PROVIDER_SITE_OTHER): Payer: Medicare Other

## 2022-09-21 ENCOUNTER — Encounter: Payer: Self-pay | Admitting: Internal Medicine

## 2022-09-21 ENCOUNTER — Ambulatory Visit
Admission: RE | Admit: 2022-09-21 | Discharge: 2022-09-21 | Disposition: A | Discharge status: Home / Self Care | Payer: Medicare Other | Source: Ambulatory Visit | Attending: Internal Medicine | Admitting: Internal Medicine

## 2022-09-21 ENCOUNTER — Ambulatory Visit (INDEPENDENT_AMBULATORY_CARE_PROVIDER_SITE_OTHER): Payer: Medicare Other | Admitting: Internal Medicine

## 2022-09-21 VITALS — BP 100/60 | HR 83 | Temp 98.4°F

## 2022-09-21 DIAGNOSIS — I10 Essential (primary) hypertension: Secondary | ICD-10-CM

## 2022-09-21 DIAGNOSIS — J22 Unspecified acute lower respiratory infection: Secondary | ICD-10-CM | POA: Diagnosis not present

## 2022-09-21 DIAGNOSIS — Z0000 Encounter for general adult medical examination without abnormal findings: Secondary | ICD-10-CM

## 2022-09-21 DIAGNOSIS — E7800 Pure hypercholesterolemia, unspecified: Secondary | ICD-10-CM | POA: Diagnosis not present

## 2022-09-21 DIAGNOSIS — R0989 Other specified symptoms and signs involving the circulatory and respiratory systems: Secondary | ICD-10-CM

## 2022-09-21 DIAGNOSIS — R059 Cough, unspecified: Secondary | ICD-10-CM | POA: Diagnosis not present

## 2022-09-21 MED ORDER — FLUCONAZOLE 150 MG PO TABS: 150.0000 mg | ORAL_TABLET | Freq: Once | ORAL | 0 refills | Status: AC

## 2022-09-21 MED ORDER — AMLODIPINE BESYLATE 5 MG PO TABS: 5.0000 mg | ORAL_TABLET | Freq: Every day | ORAL | 1 refills | Status: DC

## 2022-09-21 MED ORDER — AMOXICILLIN-POT CLAVULANATE 875-125 MG PO TABS
1.0000 | ORAL_TABLET | Freq: Two times a day (BID) | ORAL | 0 refills | Status: DC
Start: 2022-09-21 — End: 2022-10-16

## 2022-09-21 MED ORDER — AMITRIPTYLINE HCL 10 MG PO TABS: ORAL_TABLET | ORAL | 1 refills | Status: DC

## 2022-09-21 MED ORDER — METOPROLOL TARTRATE 25 MG PO TABS: 12.5000 mg | ORAL_TABLET | Freq: Two times a day (BID) | ORAL | 1 refills | Status: DC

## 2022-09-21 MED ORDER — SIMVASTATIN 20 MG PO TABS: 20.0000 mg | ORAL_TABLET | Freq: Every day | ORAL | 1 refills | Status: DC

## 2022-09-21 MED ORDER — HYDROCODONE BIT-HOMATROP MBR 5-1.5 MG/5ML PO SOLN: 5.0000 mL | Freq: Three times a day (TID) | ORAL | 0 refills | Status: DC | PRN

## 2022-09-21 MED ORDER — CEFTRIAXONE SODIUM 1 G IJ SOLR
1.0000 g | Freq: Once | INTRAMUSCULAR | Status: AC
Start: 2022-09-21 — End: 2022-09-21
  Administered 2022-09-21: 1 g via INTRAMUSCULAR

## 2022-09-21 NOTE — Progress Notes (Addendum)
Subjective:   Tracie Roach is a 67 y.o. female who presents for an  Medicare Annual Wellness Visit, subsequent.  Review of Systems         Objective:    There were no vitals filed for this visit. There is no height or weight on file to calculate BMI.     09/15/2021   11:04 AM 04/19/2017   10:02 AM 04/19/2017   10:01 AM 06/29/2012    3:05 AM  Advanced Directives  Does Patient Have a Medical Advance Directive? Yes No Yes Patient has advance directive, copy not in chart  Type of Advance Directive Healthcare Power of [place];Living will  Living will;Healthcare Power of Attorney Living will  Does patient want to make changes to medical advance directive? No - Patient declined     Copy of Healthcare Power of Attorney in Chart? No - copy requested   Copy requested from family  Pre-existing out of facility DNR order (yellow form or pink MOST form)    No    Current Medications (verified) Outpatient Encounter Medications as of 09/21/2022  Medication Sig   acetaminophen (TYLENOL) 500 MG tablet Take 500 mg by mouth every 6 (six) hours as needed.   ALPRAZolam (XANAX) 0.5 MG tablet TAKE 1/2 TABLET BY MOUTH AT BEDTIME AS NEEDED FOR SLEEP   amitriptyline (ELAVIL) 10 MG tablet TAKE 2 TABLETS BY MOUTH EVERY NIGHT AT BEDTIME   amLODipine (NORVASC) 5 MG tablet Take 1 tablet (5 mg total) by mouth daily.   amoxicillin-clavulanate (AUGMENTIN) 875-125 MG tablet Take 1 tablet by mouth 2 (two) times daily.   aspirin EC 325 MG EC tablet Take 1 tablet (325 mg total) by mouth daily.   B Complex-C (B-COMPLEX WITH VITAMIN C) tablet Take 1 tablet by mouth daily.   butalbital-acetaminophen-caffeine (FIORICET) 50-325-40 MG tablet TAKE 1 TO 2 TABLETS BY MOUTH EVERY 12 HOURS AS NEEDED FOR HEADACHE   CALCIUM PO Take by mouth.   Cholecalciferol (VITAMIN D3 PO) Take by mouth.   cyclobenzaprine (FLEXERIL) 10 MG tablet TAKE 1/2-1 TABLET AT BEDTIME AS NEEDED   Docusate Calcium (STOOL SOFTENER PO) Take 1 tablet by  mouth daily.   estradiol (ESTRACE) 0.1 MG/GM vaginal cream estradiol 0.01% (0.1 mg/gram) vaginal cream  INSERT 0.5 GRAM VAGINALLY TWICE A WEEK   fluconazole (DIFLUCAN) 150 MG tablet Take 1 tablet (150 mg total) by mouth once for 1 dose.   HYDROcodone bit-homatropine (HYCODAN) 5-1.5 MG/5ML syrup Take 5 mLs by mouth every 8 (eight) hours as needed for cough.   HYDROcodone-acetaminophen (NORCO) 5-325 MG tablet Take 1 tablet by mouth every 6 (six) hours as needed for moderate pain.   levocetirizine (XYZAL) 5 MG tablet Take 5 mg by mouth every evening.   losartan (COZAAR) 100 MG tablet Take 1 tablet (100 mg total) by mouth daily.   magnesium gluconate (MAGONATE) 500 MG tablet Take 1 tablet (500 mg total) by mouth daily.   metoprolol tartrate (LOPRESSOR) 25 MG tablet Take 0.5 tablets (12.5 mg total) by mouth 2 (two) times daily.   Multiple Vitamins-Minerals (EYE VITAMINS) CAPS Take by mouth.   Multiple Vitamins-Minerals (MULTIVITAMIN WITH MINERALS) tablet Take 1 tablet by mouth daily.   naproxen sodium (ALEVE) 220 MG tablet Take 220 mg by mouth 2 (two) times daily as needed.   simvastatin (ZOCOR) 20 MG tablet Take 1 tablet (20 mg total) by mouth at bedtime.   No facility-administered encounter medications on file as of 09/21/2022.    Allergies (verified) Patient has  no known allergies.   History: Past Medical History:  Diagnosis Date   Allergy    Anxiety    Arthritis    Cataract    beginnings of    Essential tremor    Functional constipation    Heart murmur    Hyperlipidemia    Hypertension    Stroke Nov 05, 2012   Past Surgical History:  Procedure Laterality Date   APPENDECTOMY     NSVD     had sedation with 1st preg   Posterior vitreous detachment Left 08/13/15   benign   UPPER GASTROINTESTINAL ENDOSCOPY     Family History  Problem Relation Age of Onset   Heart disease Mother    Colon cancer Neg Hx    Colon polyps Neg Hx    Esophageal cancer Neg Hx    Rectal cancer Neg Hx     Stomach cancer Neg Hx    Breast cancer Neg Hx    Social History   Socioeconomic History   Marital status: Married    Spouse name: Chemical engineer   Number of children: 2   Years of education: college   Highest education level: Not on file  Occupational History   Not on file  Tobacco Use   Smoking status: Former    Packs/day: .3    Types: Cigarettes    Quit date: 08/27/2011    Years since quitting: 11.0   Smokeless tobacco: Never  Vaping Use   Vaping Use: Never used  Substance and Sexual Activity   Alcohol use: No   Drug use: No   Sexual activity: Not on file  Other Topics Concern   Not on file  Social History Narrative          Social history: She is married with 2 adult children.  Several grandchildren.  Does not smoke or consume alcohol.  Quit smoking in March 2013.  Used to smoke 6 or 7 cigarettes daily.  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 Patient has 2 years of community college.       Family history: Father died in 11-06-07 with pulmonary fibrosis.  Mother is living but doing poorly with history of CABG and valve replacement.  1 sister in good health.  No brothers.   Social Determinants of Health   Financial Resource Strain: Not on file  Food Insecurity: Not on file  Transportation Needs: Not on file  Physical Activity: Not on file  Stress: Not on file  Social Connections: Not on file    Tobacco Counseling Counseling given: Not Answered   Clinical Intake:                 Diabetic?No         Activities of Daily Living     No data to display           Patient Care Team: Baxley, Luanna Cole, MD as PCP - General (Internal Medicine)  Indicate any recent Medical Services you may have received from other than [facility] providers in the past year (date may be approximate).     Assessment:   This is a routine wellness examination for Tracie Roach.  Hearing/Vision  screen No results found.  Dietary issues and exercise activities discussed:     Goals Addressed   None   Depression Screen    09/21/2022   10:14 AM 03/16/2022   10:35 AM 09/15/2021   11:05 AM 03/17/2021  9:57 AM 09/13/2020   10:12 AM 09/11/2019   10:23 AM 09/09/2018   11:04 AM  PHQ 2/9 Scores  PHQ - 2 Score 0 0 0 0 0 0 0    Fall Risk    09/21/2022   10:14 AM 03/16/2022   10:35 AM 09/15/2021   11:04 AM 03/17/2021    9:57 AM 09/13/2020   10:12 AM  Fall Risk   Falls in the past year? 0  0 0 1  Number falls in past yr: 0 0 0 0 0  Injury with Fall? 0 0 0 0 0  Risk for fall due to : No Fall Risks No Fall Risks No Fall Risks No Fall Risks   Follow up Falls prevention discussed Falls evaluation completed Falls evaluation completed Falls evaluation completed Falls evaluation completed    FALL RISK PREVENTION PERTAINING TO THE HOME:  Any stairs in or around the home? Yes  If so, are there any without handrails? No  Home free of loose throw rugs in walkways, pet beds, electrical cords, etc? Yes  Adequate lighting in your home to reduce risk of falls? Yes   ASSISTIVE DEVICES UTILIZED TO PREVENT FALLS:  Life alert? No  Use of a cane, walker or w/c? No  Grab bars in the bathroom? Yes  Shower chair or bench in shower? Yes  Elevated toilet seat or a handicapped toilet? Yes   TIMED UP AND GO:  Was the test performed?  N/A .  Length of time to ambulate 10 feet: N/A sec.   Gait slow and steady without use of assistive device  Cognitive Function:        09/15/2021   11:05 AM  6CIT Screen  What Year? 0 points  What month? 0 points  What time? 0 points  Count back from 20 0 points  Months in reverse 0 points  Repeat phrase 0 points  Total Score 0 points    Immunizations Immunization History  Administered Date(s) Administered   Influenza Split 03/14/2010, 04/19/2012, 03/14/2013   Influenza,inj,Quad PF,6+ Mos 02/23/2015, 02/26/2018, 03/10/2019, 03/11/2020, 03/17/2021,  03/16/2022   Influenza-Unspecified 04/13/2014, 03/24/2016, 03/09/2017   PFIZER(Purple Top)SARS-COV-2 Vaccination 09/08/2019, 10/01/2019, 05/22/2020   PNEUMOCOCCAL CONJUGATE-20 09/15/2021   Respiratory Syncytial Virus Vaccine,Recomb Aduvanted(Arexvy) 05/05/2022   Tdap 03/14/2010, 01/26/2020   Zoster Recombinat (Shingrix) 02/26/2017, 05/31/2017    TDAP status: Up to date  Flu Vaccine status: Up to date  Pneumococcal vaccine status: Up to date  Covid-19 vaccine status: Information provided on how to obtain vaccines.   Qualifies for Shingles Vaccine? Yes   Zostavax completed Yes   Shingrix Completed?: Yes  Screening Tests Health Maintenance  Topic Date Due   COVID-19 Vaccine (4 - 2023-24 season) 02/17/2022   Hepatitis C Screening  03/11/2023 (Originally 08/28/1973)   INFLUENZA VACCINE  01/18/2023   Medicare Annual Wellness (AWV)  09/21/2023   MAMMOGRAM  01/24/2024   COLONOSCOPY (Pts 45-29yrs Insurance coverage will need to be confirmed)  05/04/2027   DTaP/Tdap/Td (3 - Td or Tdap) 01/25/2030   Pneumonia Vaccine 81+ Years old  Completed   DEXA SCAN  Completed   Zoster Vaccines- Shingrix  Completed   HPV VACCINES  Aged Out    Health Maintenance  Health Maintenance Due  Topic Date Due   COVID-19 Vaccine (4 - 2023-24 season) 02/17/2022    Colorectal cancer screening: Type of screening: Colonoscopy. Completed 05/03/17. Repeat every 10 years  Mammogram status: Completed 01/23/22. Repeat every year  Bone Density status: Completed 03/22/22. Results  reflect: Bone density results: NORMAL. Repeat every 2 years.  Lung Cancer Screening: (Low Dose CT Chest recommended if Age 66-80 years, 30 pack-year currently smoking OR have quit w/in 15years.) does not qualify.   Lung Cancer Screening Referral: N/A  Additional Screening:  Hepatitis C Screening: does not qualify; Completed N/A  Vision Screening: Recommended annual ophthalmology exams for early detection of glaucoma and other  disorders of the eye. Is the patient up to date with their annual eye exam?  Yes  Who is the provider or what is the name of the office in which the patient attends annual eye exams?  If pt is not established with a provider, would they like to be referred to a provider to establish care? No .   Dental Screening: Recommended annual dental exams for proper oral hygiene  Community Resource Referral / Chronic Care Management: CRR required this visit?  No   CCM required this visit?  No      Plan:     I have personally reviewed and noted the following in the patient's chart:   Medical and social history Use of alcohol, tobacco or illicit drugs  Current medications and supplements including opioid prescriptions. Patient is not currently taking opioid prescriptions. Functional ability and status Nutritional status Physical activity Advanced directives List of other physicians Hospitalizations, surgeries, and ER visits in previous 12 months Vitals Screenings to include cognitive, depression, and falls Referrals and appointments  In addition, I have reviewed and discussed with patient certain preventive protocols, quality metrics, and best practice recommendations. A written personalized care plan for preventive services as well as general preventive health recommendations were provided to patient.     Lynnea Maizes, CMA   09/21/2022   Nurse Notes:  Patient was at her home during [facility] and myself and provider was in office. This was a telephone visit.   IMargaree Mackintosh, MD, have reviewed all documentation for this visit. The documentation on 10/23/22 for the exam, diagnosis, procedures, and orders are all accurate and complete.

## 2022-09-21 NOTE — Telephone Encounter (Signed)
Error

## 2022-09-21 NOTE — Progress Notes (Signed)
Patient Care Team: Elby Showers, MD as PCP - General (Internal Medicine)  Visit Date: 09/21/22  Subjective:    Patient ID: Tracie Roach , Female   DOB: 30-Mar-1956, 67 y.o.    MRN: [id number]   Tracie Roach is a 67 y.o. Female who presents today for cough, chest congestion, intermittent fever. Has been experiencing cough and chest congestion since 09/16/22. Denies shaking chills, shortness of breath. Has had intermittent fever. Does not use an inhaler.  Past Medical History:  Diagnosis Date   Allergy    Anxiety    Arthritis    Cataract    beginnings of    Essential tremor    Functional constipation    Heart murmur    Hyperlipidemia    Hypertension    Stroke 09-27-12     Family History  Problem Relation Age of Onset   Heart disease Mother    Colon cancer Neg Hx    Colon polyps Neg Hx    Esophageal cancer Neg Hx    Rectal cancer Neg Hx    Stomach cancer Neg Hx    Breast cancer Neg Hx     Social History   Social History Narrative          Social history: She is married with 2 adult children.  Several grandchildren.  Does not smoke or consume alcohol.  Quit smoking in March 2013.  Used to smoke 6 or 7 cigarettes daily.  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 Patient lives with husband Richardson Landry. Patient has 2 years of community college.       Family history: Father died in Sep 28, 2007 with pulmonary fibrosis.  Mother is living but doing poorly with history of CABG and valve replacement.  1 sister in good health.  No brothers.      Review of Systems  Constitutional:  Positive for fever (Intermittent). Negative for chills and malaise/fatigue.  HENT:  Positive for congestion (Chest).   Eyes:  Negative for blurred vision.  Respiratory:  Positive for cough. Negative for shortness of breath.   Cardiovascular:  Negative for chest pain, palpitations and leg swelling.  Gastrointestinal:  Negative for vomiting.  Musculoskeletal:   Negative for back pain.  Skin:  Negative for rash.  Neurological:  Negative for loss of consciousness and headaches.        Objective:   Vitals: BP 100/60   Pulse 83   Temp 98.4 F (36.9 C) (Tympanic)   SpO2 97%    Physical Exam Vitals and nursing note reviewed.  Constitutional:      General: She is not in acute distress.    Appearance: Normal appearance. She is not toxic-appearing.  HENT:     Head: Normocephalic and atraumatic.     Right Ear: Hearing, ear canal and external ear normal.     Left Ear: Hearing, tympanic membrane, ear canal and external ear normal.     Ears:     Comments: Right TM full with splayed light reflex.    Mouth/Throat:     Pharynx: No oropharyngeal exudate.     Comments: Pharynx injected without exudate. Pulmonary:     Effort: Pulmonary effort is normal. No respiratory distress.     Breath sounds: Wheezing present. No rales.     Comments: Inspiratory wheezing throughout lung fields. Skin:    General: Skin is warm and dry.  Neurological:     Mental Status: She is  alert and oriented to person, place, and time. Mental status is at baseline.  Psychiatric:        Mood and Affect: Mood normal.        Behavior: Behavior normal.        Thought Content: Thought content normal.        Judgment: Judgment normal.    Results:   Studies obtained and personally reviewed by me:   Labs:       Component Value Date/Time   NA 143 09/14/2022 0922   K 4.6 09/14/2022 0922   CL 104 09/14/2022 0922   CO2 31 09/14/2022 0922   GLUCOSE 97 09/14/2022 0922   BUN 13 09/14/2022 0922   CREATININE 0.81 09/14/2022 0922   CALCIUM 9.5 09/14/2022 0922   PROT 7.0 09/14/2022 0922   ALBUMIN 4.3 02/20/2017 1055   AST 19 09/14/2022 0922   ALT 17 09/14/2022 0922   ALKPHOS 114 02/20/2017 1055   BILITOT 0.4 09/14/2022 0922   GFRNONAA 66 09/09/2020 0905   GFRAA 76 09/09/2020 0905     Lab Results  Component Value Date   WBC 7.3 09/14/2022   HGB 13.4 09/14/2022    HCT 40.5 09/14/2022   MCV 91.2 09/14/2022   PLT 354 09/14/2022    Lab Results  Component Value Date   CHOL 149 09/14/2022   HDL 55 09/14/2022   LDLCALC 79 09/14/2022   TRIG 73 09/14/2022   CHOLHDL 2.7 09/14/2022    Lab Results  Component Value Date   HGBA1C 5.4 03/10/2022     Lab Results  Component Value Date   TSH 3.23 09/14/2022      Assessment & Plan:   Acute lower respiratory infection: Administered Rocephin 1 g IM. Prescribed Augmentin 875-125 mg one tablet twice daily, Hycodan 5 mL every 8 hours as needed for cough. Chest X-ray ordered.   Reschedule Medicare wellness visit for another time.  Labs are within normal limits that were done for anticipated CPE exam.  Patient was ill today and therefore we did not pursue her annual wellness visit.  I,Alexander Ruley,acting as a Neurosurgeon for Margaree Mackintosh, MD.,have documented all relevant documentation on the behalf of Margaree Mackintosh, MD,as directed by  Margaree Mackintosh, MD while in the presence of Margaree Mackintosh, MD.   I, Margaree Mackintosh, MD, have reviewed all documentation for this visit. The documentation on 10/01/22 for the exam, diagnosis, procedures, and orders are all accurate and complete.

## 2022-09-21 NOTE — Patient Instructions (Addendum)
Patient to have chest x-ray today.  Given 1 g IM Rocephin in the office and started on Augmentin for 10 days.  Hycodan 1 teaspoon every 6-8 hours if needed for cough.  Diflucan if needed for Candida vaginitis while on antibiotics.  We will need to reschedule Medicare wellness visit present at another time today and her health maintenance exam to be determined at a later date.  I have reviewed her CPE labs that were drawn on March 28 and they are all entirely within normal limits.

## 2022-09-28 ENCOUNTER — Other Ambulatory Visit: Payer: Self-pay | Admitting: Internal Medicine

## 2022-09-28 NOTE — Progress Notes (Signed)
Patient Care Team: Margaree Mackintosh, MD as PCP - General (Internal Medicine)  Visit Date: 10/05/22  Subjective:    Patient ID: Tracie Roach , Female   DOB: 28-Aug-1955, 67 y.o.    MRN: [id number]   67 y.o. Female presents today for follow-up on acute lower respiratory infection with possible Left lateral base pneumonia. She was treated here for an acute lower respiratory infection on 09/21/22 with Rocephin, Augmentin, Hycodan. Still has mild lingering cough and right ear discomfort. She also has a ringing in her ears that started yesterday. Energy level is improving.   Past Medical History:  Diagnosis Date   Allergy    Anxiety    Arthritis    Cataract    beginnings of    Essential tremor    Functional constipation    Heart murmur    Hyperlipidemia    Hypertension    Stroke Oct 31, 2012     Family History  Problem Relation Age of Onset   Heart disease Mother    Colon cancer Neg Hx    Colon polyps Neg Hx    Esophageal cancer Neg Hx    Rectal cancer Neg Hx    Stomach cancer Neg Hx    Breast cancer Neg Hx     Social History   Social History Narrative          Social history: She is married with 2 adult children.  Several grandchildren.  Does not smoke or consume alcohol.  Quit smoking in March 2013.  Used to smoke 6 or 7 cigarettes daily.  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 Patient has 2 years of community college.       Family history: Father died in 2007/11/01 with pulmonary fibrosis.  Mother is living but doing poorly with history of CABG and valve replacement.  1 sister in good health.  No brothers.      Review of Systems  Constitutional:  Negative for fever and malaise/fatigue.  HENT:  Positive for ear pain (Right) and tinnitus. Negative for congestion.   Eyes:  Negative for blurred vision.  Respiratory:  Positive for cough. Negative for shortness of breath.   Cardiovascular:  Negative  for chest pain, palpitations and leg swelling.  Gastrointestinal:  Negative for vomiting.  Musculoskeletal:  Negative for back pain.  Skin:  Negative for rash.  Neurological:  Negative for loss of consciousness and headaches.        Objective:   Vitals: BP 120/74   Pulse 82   Temp 97.9 F (36.6 C) (Tympanic)   Ht 5\' 6"  (1.676 m)   Wt 167 lb 1.9 oz (75.8 kg)   SpO2 99%   BMI 26.97 kg/m    Physical Exam Vitals and nursing note reviewed.  Constitutional:      General: She is not in acute distress.    Appearance: Normal appearance. She is not toxic-appearing.  HENT:     Head: Normocephalic and atraumatic.     Right Ear: Hearing, ear canal and external ear normal.     Left Ear: Hearing, tympanic membrane, ear canal and external ear normal.     Ears:     Comments: Right TM slightly full, not erythematous. Left TM clear.    Mouth/Throat:     Pharynx: Oropharynx is clear.  Pulmonary:     Effort: Pulmonary effort is normal. No respiratory distress.     Breath  sounds: Normal breath sounds. No wheezing or rales.  Skin:    General: Skin is warm and dry.  Neurological:     Mental Status: She is alert and oriented to person, place, and time. Mental status is at baseline.  Psychiatric:        Mood and Affect: Mood normal.        Behavior: Behavior normal.        Thought Content: Thought content normal.        Judgment: Judgment normal.       Results:   Studies obtained and personally reviewed by me:   Labs:       Component Value Date/Time   NA 143 09/14/2022 0922   K 4.6 09/14/2022 0922   CL 104 09/14/2022 0922   CO2 31 09/14/2022 0922   GLUCOSE 97 09/14/2022 0922   BUN 13 09/14/2022 0922   CREATININE 0.81 09/14/2022 0922   CALCIUM 9.5 09/14/2022 0922   PROT 7.0 09/14/2022 0922   ALBUMIN 4.3 02/20/2017 1055   AST 19 09/14/2022 0922   ALT 17 09/14/2022 0922   ALKPHOS 114 02/20/2017 1055   BILITOT 0.4 09/14/2022 0922   GFRNONAA 66 09/09/2020 0905   GFRAA 76  09/09/2020 0905     Lab Results  Component Value Date   WBC 7.3 09/14/2022   HGB 13.4 09/14/2022   HCT 40.5 09/14/2022   MCV 91.2 09/14/2022   PLT 354 09/14/2022    Lab Results  Component Value Date   CHOL 149 09/14/2022   HDL 55 09/14/2022   LDLCALC 79 09/14/2022   TRIG 73 09/14/2022   CHOLHDL 2.7 09/14/2022    Lab Results  Component Value Date   HGBA1C 5.4 03/10/2022     Lab Results  Component Value Date   TSH 3.23 09/14/2022      Assessment & Plan:   Persistent upper respiratory infection: prescribed azithromycin Z-Pak two tabs day 1 followed by one tab days 2-5, Medrol dosepak 4 mg tapering course take as directed starting with 6 tabs day 1 and decreasing by 1 tab daily 6-5-4-3-2-1 taper    I,Alexander Ruley,acting as a scribe for Margaree MackintoshMary J Titiana Severa, MD.,have documented all relevant documentation on the behalf of Margaree MackintoshMary J Catie Chiao, MD,as directed by  Margaree MackintoshMary J Clover Feehan, MD while in the presence of Margaree MackintoshMary J Tykeria Wawrzyniak, MD.   I, Margaree MackintoshMary J Larisha Vencill, MD, have reviewed all documentation for this visit. The documentation on 10/11/22 for the exam, diagnosis, procedures, and orders are all accurate and complete.

## 2022-10-05 ENCOUNTER — Ambulatory Visit (INDEPENDENT_AMBULATORY_CARE_PROVIDER_SITE_OTHER): Payer: Medicare Other | Admitting: Internal Medicine

## 2022-10-05 ENCOUNTER — Encounter: Payer: Self-pay | Admitting: Internal Medicine

## 2022-10-05 VITALS — BP 120/74 | HR 82 | Temp 97.9°F | Ht 66.0 in | Wt 167.1 lb

## 2022-10-05 DIAGNOSIS — H6501 Acute serous otitis media, right ear: Secondary | ICD-10-CM

## 2022-10-05 DIAGNOSIS — R053 Chronic cough: Secondary | ICD-10-CM | POA: Diagnosis not present

## 2022-10-05 DIAGNOSIS — H6691 Otitis media, unspecified, right ear: Secondary | ICD-10-CM

## 2022-10-05 MED ORDER — METHYLPREDNISOLONE 4 MG PO TABS
ORAL_TABLET | ORAL | 0 refills | Status: DC
Start: 2022-10-05 — End: 2022-10-16

## 2022-10-05 MED ORDER — AZITHROMYCIN 250 MG PO TABS: ORAL_TABLET | ORAL | 0 refills | Status: AC

## 2022-10-05 NOTE — Patient Instructions (Addendum)
For persistent right otitis take Medrol dose pack as directed and Zithromax Z pak 2 tabs day 1 followed by one tab days 2-5. Your CPE has been scheduled at a later date.

## 2022-10-05 NOTE — Progress Notes (Signed)
Patient Care Team: Margaree Mackintosh, MD as PCP - General (Internal Medicine)  Visit Date: 10/31/22  Subjective:    Patient ID: Tracie Roach , Female   DOB: April 01, 1956, 67 y.o.    MRN: [id number]   67 y.o. Female presents today for annual comprehensive physical exam. Patient has a past medical history of allergic rhinitis, anxiety, arthritis, cataract, essential tremor, functional constipation, heart murmur, hyperlipidemia, hypertension,  hx of stroke in 11-12-12.  Seen here for acute bronchospasm 10/16/22. Symptoms are resolved. Chest X-ray yesterday awaiting interpretation.  History of anxiety treated with alprazolam 0.25 mg at bedtime as needed.  History of migraine headaches treated with Fioricet 50-325-40 mg one to two tablets every 12 hours as needed, amitriptyline 20 mg daily at bedtime.  History of hyperlipidemia treated with simvastatin 20 mg daily at bedtime. Lipid panel normal 09/14/22.  History of hypertension treated with amlodipine 5 mg daily, losartan 100 mg daily, metoprolol tartrate 12.5 mg twice daily. Blood pressure normal today at 118/74.  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 Has persistent left upper extremity numbness and tightness treated with Flexeril and occasional hydrocodone APAP tablet.  She had COVID-19 in August 2022.  Appendectomy April 2011. History of vitreous detachment of left eye. She wears glasses.  Occasionally has a urinary infection. Seen by OB/GYN, Dr. Rana Snare regarding atrophic vaginitis.   Glucose normal. Kidney, liver function normal. Electrolytes normal. Blood proteins normal. CBC normal. TSH at 3.23.  Pap deferred due to age.  Mammogram last completed 01/23/22. No mammographic evidence of malignancy. Recommended repeat in 11-13-2022.  Colonoscopy last completed 05/03/17. Results showed entire examined colon is normal. Recommended repeat in 11/13/2026.  Past Medical History:  Diagnosis Date   Allergy    Anxiety    Arthritis    Cataract     beginnings of    Essential tremor    Functional constipation    Heart murmur    Hyperlipidemia    Hypertension    Stroke [facility]) 2014     Family History  Problem Relation Age of Onset   Heart disease Mother    Colon cancer Neg Hx    Colon polyps Neg Hx    Esophageal cancer Neg Hx    Rectal cancer Neg Hx    Stomach cancer Neg Hx    Breast cancer Neg Hx     Social History   Social History Narrative          Social history: She is married with 2 adult children.  Several grandchildren.  Does not smoke or consume alcohol.  Quit smoking in March 2013.  Used to smoke 6 or 7 cigarettes daily.  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 Patient has 2 years of community college.       Family history: Father died in 11/13/2007 with pulmonary fibrosis.  Mother is living but doing poorly with history of CABG and valve replacement.  1 sister in good health.  No brothers.      Review of Systems  Constitutional:  Negative for chills, fever, malaise/fatigue and weight loss.  HENT:  Negative for hearing loss, sinus pain and sore throat.   Respiratory:  Negative for cough, hemoptysis and shortness of breath.   Cardiovascular:  Negative for chest pain, palpitations, leg swelling and PND.  Gastrointestinal:  Negative for abdominal pain, constipation, diarrhea, heartburn, nausea and vomiting.  Genitourinary:  Negative for dysuria,  frequency and urgency.  Musculoskeletal:  Negative for back pain, myalgias and neck pain.  Skin:  Negative for itching and rash.  Neurological:  Negative for dizziness, tingling, seizures and headaches.  Endo/Heme/Allergies:  Negative for polydipsia.  Psychiatric/Behavioral:  Negative for depression. The patient is not nervous/anxious.         Objective:   Vitals: BP 118/74   Pulse 84   Resp 16   Ht 5\' 6"  (1.676 m)   Wt 167 lb 8 oz (76 kg)   SpO2 99%   BMI 27.04 kg/m    Physical  Exam Vitals and nursing note reviewed.  Constitutional:      General: She is not in acute distress.    Appearance: Normal appearance. She is not ill-appearing or toxic-appearing.  HENT:     Head: Normocephalic and atraumatic.     Right Ear: Hearing, tympanic membrane, ear canal and external ear normal.     Left Ear: Hearing, tympanic membrane, ear canal and external ear normal.     Mouth/Throat:     Pharynx: Oropharynx is clear.  Eyes:     Extraocular Movements: Extraocular movements intact.     Pupils: Pupils are equal, round, and reactive to light.  Neck:     Thyroid: No thyroid mass, thyromegaly or thyroid tenderness.     Vascular: No carotid bruit.  Cardiovascular:     Rate and Rhythm: Normal rate and regular rhythm. No extrasystoles are present.    Pulses:          Dorsalis pedis pulses are 1+ on the right side and 1+ on the left side.     Heart sounds: Normal heart sounds. No murmur heard.    No friction rub. No gallop.  Pulmonary:     Effort: Pulmonary effort is normal.     Breath sounds: Normal breath sounds. No decreased breath sounds, wheezing, rhonchi or rales.  Chest:     Chest wall: No mass.  Abdominal:     Palpations: Abdomen is soft. There is no hepatomegaly, splenomegaly or mass.     Tenderness: There is no abdominal tenderness.     Hernia: No hernia is present.  Musculoskeletal:     Cervical back: Normal range of motion.     Right lower leg: No edema.     Left lower leg: No edema.  Lymphadenopathy:     Cervical: No cervical adenopathy.     Upper Body:     Right upper body: No supraclavicular adenopathy.     Left upper body: No supraclavicular adenopathy.  Skin:    General: Skin is warm and dry.  Neurological:     General: No focal deficit present.     Mental Status: She is alert and oriented to person, place, and time. Mental status is at baseline.     Sensory: Sensation is intact.     Motor: Motor function is intact. No weakness.     Deep Tendon  Reflexes: Reflexes are normal and symmetric.  Psychiatric:        Attention and Perception: Attention normal.        Mood and Affect: Mood normal.        Speech: Speech normal.        Behavior: Behavior normal.        Thought Content: Thought content normal.        Cognition and Memory: Cognition normal.        Judgment: Judgment normal.       Results:  Studies obtained and personally reviewed by me:  Mammogram last completed 01/23/22. No mammographic evidence of malignancy. Recommended repeat in 2024.  Colonoscopy last completed 05/03/17. Results showed entire examined colon is normal. Recommended repeat in 2028.  Labs:       Component Value Date/Time   NA 143 09/14/2022 0922   K 4.6 09/14/2022 0922   CL 104 09/14/2022 0922   CO2 31 09/14/2022 0922   GLUCOSE 97 09/14/2022 0922   BUN 13 09/14/2022 0922   CREATININE 0.81 09/14/2022 0922   CALCIUM 9.5 09/14/2022 0922   PROT 7.0 09/14/2022 0922   ALBUMIN 4.3 02/20/2017 1055   AST 19 09/14/2022 0922   ALT 17 09/14/2022 0922   ALKPHOS 114 02/20/2017 1055   BILITOT 0.4 09/14/2022 0922   GFRNONAA 66 09/09/2020 0905   GFRAA 76 09/09/2020 0905     Lab Results  Component Value Date   WBC 8.9 10/16/2022   HGB 13.8 10/16/2022   HCT 41.1 10/16/2022   MCV 90.5 10/16/2022   PLT 311 10/16/2022    Lab Results  Component Value Date   CHOL 149 09/14/2022   HDL 55 09/14/2022   LDLCALC 79 09/14/2022   TRIG 73 09/14/2022   CHOLHDL 2.7 09/14/2022    Lab Results  Component Value Date   HGBA1C 5.4 03/10/2022     Lab Results  Component Value Date   TSH 3.23 09/14/2022      Assessment & Plan:   Anxiety: treated with alprazolam 0.25 mg at bedtime as needed.  Migraine headaches: treated with Fioricet 50-325-40 mg one to two tablets every 12 hours as needed, amitriptyline 20 mg daily at bedtime.  Hyperlipidemia: treated with simvastatin 20 mg daily at bedtime. Lipid panel normal 09/14/22.  Hypertension: treated with  amlodipine 5 mg daily, losartan 100 mg daily, metoprolol tartrate 12.5 mg twice daily. Blood pressure normal today at 118/74.  Chronic musculoskeletal pain: treated with hydrocodone-acetaminophen 5-325 mg every 6 hours as needed, cyclobenzaprine 5-10 mg at bedtime as needed.  Pap deferred due to age.  Mammogram: last completed 01/23/22. No mammographic evidence of malignancy. Recommended repeat in 2024.  Colonoscopy: last completed 05/03/17. Results showed entire examined colon is normal. Recommended repeat in 2028.  Vaccine counseling: UTD on flu, tetanus, shingles, pneumococcal 20 vaccines.  Ordered CBC with Diff/Plat.  Return in 1 year for health maintenance exam or as needed.       I,Alexander Ruley,acting as a Neurosurgeon for Margaree Mackintosh, MD.,have documented all relevant documentation on the behalf of Margaree Mackintosh, MD,as directed by  Margaree Mackintosh, MD while in the presence of Margaree Mackintosh, MD.   I, Margaree Mackintosh, MD, have reviewed all documentation for this visit. The documentation on 11/05/22 for the exam, diagnosis, procedures, and orders are all accurate and complete.

## 2022-10-16 ENCOUNTER — Other Ambulatory Visit: Payer: Self-pay

## 2022-10-16 ENCOUNTER — Ambulatory Visit
Admission: RE | Admit: 2022-10-16 | Discharge: 2022-10-16 | Disposition: A | Discharge status: Home / Self Care | Payer: Medicare Other | Source: Ambulatory Visit | Attending: Internal Medicine | Admitting: Internal Medicine

## 2022-10-16 ENCOUNTER — Ambulatory Visit (INDEPENDENT_AMBULATORY_CARE_PROVIDER_SITE_OTHER): Payer: Medicare Other | Admitting: Internal Medicine

## 2022-10-16 ENCOUNTER — Telehealth: Payer: Self-pay | Admitting: Internal Medicine

## 2022-10-16 ENCOUNTER — Encounter: Payer: Self-pay | Admitting: Internal Medicine

## 2022-10-16 VITALS — BP 122/68 | HR 85 | Temp 98.3°F | Ht 66.0 in | Wt 167.0 lb

## 2022-10-16 DIAGNOSIS — R0989 Other specified symptoms and signs involving the circulatory and respiratory systems: Secondary | ICD-10-CM | POA: Diagnosis not present

## 2022-10-16 DIAGNOSIS — J9801 Acute bronchospasm: Secondary | ICD-10-CM | POA: Diagnosis not present

## 2022-10-16 DIAGNOSIS — R051 Acute cough: Secondary | ICD-10-CM

## 2022-10-16 DIAGNOSIS — E7800 Pure hypercholesterolemia, unspecified: Secondary | ICD-10-CM

## 2022-10-16 DIAGNOSIS — R062 Wheezing: Secondary | ICD-10-CM | POA: Diagnosis not present

## 2022-10-16 DIAGNOSIS — J22 Unspecified acute lower respiratory infection: Secondary | ICD-10-CM

## 2022-10-16 DIAGNOSIS — I10 Essential (primary) hypertension: Secondary | ICD-10-CM

## 2022-10-16 DIAGNOSIS — R059 Cough, unspecified: Secondary | ICD-10-CM | POA: Diagnosis not present

## 2022-10-16 DIAGNOSIS — Z09 Encounter for follow-up examination after completed treatment for conditions other than malignant neoplasm: Secondary | ICD-10-CM

## 2022-10-16 LAB — CBC WITH DIFFERENTIAL/PLATELET
Absolute Monocytes: 757 cells/uL (ref 200–950)
Basophils Absolute: 27 cells/uL (ref 0–200)
Basophils Relative: 0.3 %
Eosinophils Absolute: 1104 cells/uL — ABNORMAL HIGH (ref 15–500)
Eosinophils Relative: 12.4 %
HCT: 41.1 % (ref 35.0–45.0)
Hemoglobin: 13.8 g/dL (ref 11.7–15.5)
Lymphs Abs: 2056 cells/uL (ref 850–3900)
MCH: 30.4 pg (ref 27.0–33.0)
MCHC: 33.6 g/dL (ref 32.0–36.0)
MCV: 90.5 fL (ref 80.0–100.0)
MPV: 10.7 fL (ref 7.5–12.5)
Monocytes Relative: 8.5 %
Neutro Abs: 4957 cells/uL (ref 1500–7800)
Neutrophils Relative %: 55.7 %
Platelets: 311 10*3/uL (ref 140–400)
RBC: 4.54 10*6/uL (ref 3.80–5.10)
RDW: 11.8 % (ref 11.0–15.0)
Total Lymphocyte: 23.1 %
WBC: 8.9 10*3/uL (ref 3.8–10.8)

## 2022-10-16 MED ORDER — ALBUTEROL SULFATE HFA 108 (90 BASE) MCG/ACT IN AERS: 2.0000 | INHALATION_SPRAY | Freq: Four times a day (QID) | RESPIRATORY_TRACT | 0 refills | Status: DC | PRN

## 2022-10-16 MED ORDER — ALBUTEROL SULFATE (2.5 MG/3ML) 0.083% IN NEBU
2.5000 mg | INHALATION_SOLUTION | Freq: Once | RESPIRATORY_TRACT | Status: AC
Start: 2022-10-16 — End: 2022-10-16
  Administered 2022-10-16: 2.5 mg via RESPIRATORY_TRACT

## 2022-10-16 MED ORDER — CEFTRIAXONE SODIUM 1 G IJ SOLR
1.0000 g | Freq: Once | INTRAMUSCULAR | Status: AC
Start: 2022-10-16 — End: 2022-10-16
  Administered 2022-10-16: 1 g via INTRAMUSCULAR

## 2022-10-16 MED ORDER — HYDROCODONE BIT-HOMATROP MBR 5-1.5 MG/5ML PO SOLN: 5.0000 mL | Freq: Three times a day (TID) | ORAL | 0 refills | Status: DC | PRN

## 2022-10-16 MED ORDER — DOXYCYCLINE HYCLATE 100 MG PO TABS
100.0000 mg | ORAL_TABLET | Freq: Two times a day (BID) | ORAL | 0 refills | Status: DC
Start: 2022-10-16 — End: 2022-10-31

## 2022-10-16 MED ORDER — METHYLPREDNISOLONE 4 MG PO TABS: ORAL_TABLET | ORAL | 0 refills | Status: DC

## 2022-10-16 MED ORDER — FLUCONAZOLE 150 MG PO TABS: 150.0000 mg | ORAL_TABLET | Freq: Once | ORAL | 0 refills | Status: AC

## 2022-10-16 NOTE — Telephone Encounter (Signed)
Tracie Roach [phone]  [name] called to say she started getting sick with Nose clogged up, brown greenish mucus, hoarse, cough moving into chest since Wednesday, COVID test negative, it is just getting worse, she did try taking some over the counter medication but it has not helped. I scheduled for 10:30 today

## 2022-10-16 NOTE — Patient Instructions (Addendum)
[facility] Repeat CXR mid May when returns for CPE.  Take Hycodan sparingly 1 teaspoon every 8 hours as needed for cough.  Diflucan 150 mg tablet one-time dose if needed for Candida vaginitis while on antibiotics.  Rocephin 1 g IM given in office.  Take doxycycline 100 mg twice daily for 10 days.  Medrol Dosepak ordered to take in tapering course as directed.  Return in May for health maintenance exam and consider repeat CXR at that time

## 2022-10-16 NOTE — Progress Notes (Signed)
Patient Care Team: Margaree Mackintosh, MD as PCP - General (Internal Medicine)  Visit Date: 10/16/22  Subjective:    Patient ID: Tracie Roach , Female   DOB: 12/25/1955, 67 y.o.    MRN: [id number]   67 y.o. Female presents today for sinus congestion, hoarseness, cough with green/brown mucus, headaches since 10/15/22. She had been improving prior to that. Denies fever, sore throat, ear pain. Her appetite is normal. She was seen here on 10/11/22, 10/01/22, and 09/21/22 for persistent respiratory symptoms.    Past Medical History:  Diagnosis Date   Allergy    Anxiety    Arthritis    Cataract    beginnings of    Essential tremor    Functional constipation    Heart murmur    Hyperlipidemia    Hypertension    Stroke [facility]) 2014     Family History  Problem Relation Age of Onset   Heart disease Mother    Colon cancer Neg Hx    Colon polyps Neg Hx    Esophageal cancer Neg Hx    Rectal cancer Neg Hx    Stomach cancer Neg Hx    Breast cancer Neg Hx     Social History   Social History Narrative          Social history: She is married with 2 adult children.  Several grandchildren.  Does not smoke or consume alcohol.  Quit smoking in March 2013.  Used to smoke 6 or 7 cigarettes daily.  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 Patient has 2 years of community college.       Family history: Father died in 11-23-07 with pulmonary fibrosis.  Mother is living but doing poorly with history of CABG and valve replacement.  1 sister in good health.  No brothers.      Review of Systems  Constitutional:  Negative for fever and malaise/fatigue.  HENT:  Positive for congestion (Sinus). Negative for ear pain and sore throat.   Eyes:  Negative for blurred vision.  Respiratory:  Positive for cough, sputum production (Green/brown) and wheezing. Negative for shortness of breath.   Cardiovascular:  Negative for chest  pain, palpitations and leg swelling.  Gastrointestinal:  Negative for vomiting.  Musculoskeletal:  Negative for back pain.  Skin:  Negative for rash.  Neurological:  Positive for headaches. Negative for loss of consciousness.        Objective:   Vitals: BP 122/68   Pulse 85   Temp 98.3 F (36.8 C) (Tympanic)   Ht 5\' 6"  (1.676 m)   Wt 167 lb (75.8 kg)   SpO2 98%   BMI 26.95 kg/m    Physical Exam Vitals and nursing note reviewed.  Constitutional:      General: She is not in acute distress.    Appearance: Normal appearance. She is not toxic-appearing.  HENT:     Head: Normocephalic and atraumatic.     Right Ear: Hearing, tympanic membrane, ear canal and external ear normal.     Left Ear: Hearing, ear canal and external ear normal.     Ears:     Comments: Left TM slightly full. Pulmonary:     Effort: Pulmonary effort is normal.     Breath sounds: Wheezing and rhonchi present.     Comments: Expiratory rhonchi and wheezing. Skin:    General: Skin is warm and dry.  Neurological:  Mental Status: She is alert and oriented to person, place, and time. Mental status is at baseline.  Psychiatric:        Mood and Affect: Mood normal.        Behavior: Behavior normal.        Thought Content: Thought content normal.        Judgment: Judgment normal.       Results:   Studies obtained and personally reviewed by me:   Labs:       Component Value Date/Time   NA 143 09/14/2022 0922   K 4.6 09/14/2022 0922   CL 104 09/14/2022 0922   CO2 31 09/14/2022 0922   GLUCOSE 97 09/14/2022 0922   BUN 13 09/14/2022 0922   CREATININE 0.81 09/14/2022 0922   CALCIUM 9.5 09/14/2022 0922   PROT 7.0 09/14/2022 0922   ALBUMIN 4.3 02/20/2017 1055   AST 19 09/14/2022 0922   ALT 17 09/14/2022 0922   ALKPHOS 114 02/20/2017 1055   BILITOT 0.4 09/14/2022 0922   GFRNONAA 66 09/09/2020 0905   GFRAA 76 09/09/2020 0905     Lab Results  Component Value Date   WBC 7.3 09/14/2022   HGB  13.4 09/14/2022   HCT 40.5 09/14/2022   MCV 91.2 09/14/2022   PLT 354 09/14/2022    Lab Results  Component Value Date   CHOL 149 09/14/2022   HDL 55 09/14/2022   LDLCALC 79 09/14/2022   TRIG 73 09/14/2022   CHOLHDL 2.7 09/14/2022    Lab Results  Component Value Date   HGBA1C 5.4 03/10/2022     Lab Results  Component Value Date   TSH 3.23 09/14/2022      Assessment & Plan:   Persistent lower respiratory infection: ordered CXR that showed improvement in previously seen left lateral basilar opacities. Linear opacities in right base favored to reflect subsegmental atelectasis.   Ordered CBC with Diff/Plat. Administered albuterol 0.083% nebulizer, Rocephin 1 g IM.   Prescribed doxycycline 100 mg twice daily, Hycodan 5 mL every 8 hours as needed for cough, Diflucan 150 mg tablet once  if needed for Candida vaginitis. Ordered medrol 4 mg 6 day dose pack.  [facility].  I,Alexander Ruley,acting as a Neurosurgeon for Margaree Mackintosh, MD.,have documented all relevant documentation on the behalf of Margaree Mackintosh, MD,as directed by  Margaree Mackintosh, MD while in the presence of Margaree Mackintosh, MD.   I, Margaree Mackintosh, MD, have reviewed all documentation for this visit. The documentation on 10/16/22 for the exam, diagnosis, procedures, and orders are all accurate and complete.

## 2022-10-30 ENCOUNTER — Ambulatory Visit
Admission: RE | Admit: 2022-10-30 | Discharge: 2022-10-30 | Disposition: A | Discharge status: Home / Self Care | Payer: Medicare Other | Source: Ambulatory Visit | Attending: Internal Medicine | Admitting: Internal Medicine

## 2022-10-30 ENCOUNTER — Other Ambulatory Visit: Payer: Self-pay

## 2022-10-30 DIAGNOSIS — R059 Cough, unspecified: Secondary | ICD-10-CM | POA: Diagnosis not present

## 2022-10-30 DIAGNOSIS — R062 Wheezing: Secondary | ICD-10-CM

## 2022-10-30 DIAGNOSIS — R051 Acute cough: Secondary | ICD-10-CM

## 2022-10-31 ENCOUNTER — Encounter: Payer: Self-pay | Admitting: Internal Medicine

## 2022-10-31 ENCOUNTER — Ambulatory Visit (INDEPENDENT_AMBULATORY_CARE_PROVIDER_SITE_OTHER): Payer: Medicare Other | Admitting: Internal Medicine

## 2022-10-31 ENCOUNTER — Other Ambulatory Visit: Payer: Self-pay | Admitting: Internal Medicine

## 2022-10-31 VITALS — BP 118/74 | HR 84 | Resp 16 | Ht 66.0 in | Wt 167.5 lb

## 2022-10-31 DIAGNOSIS — J309 Allergic rhinitis, unspecified: Secondary | ICD-10-CM

## 2022-10-31 DIAGNOSIS — Z8669 Personal history of other diseases of the nervous system and sense organs: Secondary | ICD-10-CM

## 2022-10-31 DIAGNOSIS — Z0000 Encounter for general adult medical examination without abnormal findings: Secondary | ICD-10-CM

## 2022-10-31 DIAGNOSIS — I10 Essential (primary) hypertension: Secondary | ICD-10-CM

## 2022-10-31 DIAGNOSIS — I6930 Unspecified sequelae of cerebral infarction: Secondary | ICD-10-CM | POA: Diagnosis not present

## 2022-10-31 DIAGNOSIS — R202 Paresthesia of skin: Secondary | ICD-10-CM

## 2022-10-31 DIAGNOSIS — D721 Eosinophilia, unspecified: Secondary | ICD-10-CM | POA: Diagnosis not present

## 2022-10-31 DIAGNOSIS — E7800 Pure hypercholesterolemia, unspecified: Secondary | ICD-10-CM

## 2022-10-31 DIAGNOSIS — F419 Anxiety disorder, unspecified: Secondary | ICD-10-CM

## 2022-10-31 LAB — CBC WITH DIFFERENTIAL/PLATELET
Absolute Monocytes: 650 cells/uL (ref 200–950)
Basophils Absolute: 70 cells/uL (ref 0–200)
Basophils Relative: 0.7 %
Eosinophils Absolute: 680 cells/uL — ABNORMAL HIGH (ref 15–500)
Eosinophils Relative: 6.8 %
HCT: 43.1 % (ref 35.0–45.0)
Hemoglobin: 14.3 g/dL (ref 11.7–15.5)
Lymphs Abs: 2950 cells/uL (ref 850–3900)
MCH: 30.3 pg (ref 27.0–33.0)
MCHC: 33.2 g/dL (ref 32.0–36.0)
MCV: 91.3 fL (ref 80.0–100.0)
MPV: 10.5 fL (ref 7.5–12.5)
Monocytes Relative: 6.5 %
Neutro Abs: 5650 cells/uL (ref 1500–7800)
Neutrophils Relative %: 56.5 %
Platelets: 385 10*3/uL (ref 140–400)
RBC: 4.72 10*6/uL (ref 3.80–5.10)
RDW: 12 % (ref 11.0–15.0)
Total Lymphocyte: 29.5 %
WBC: 10 10*3/uL (ref 3.8–10.8)

## 2022-10-31 LAB — POCT URINALYSIS DIPSTICK
Bilirubin, UA: NEGATIVE
Blood, UA: NEGATIVE
Glucose, UA: NEGATIVE
Ketones, UA: NEGATIVE
Leukocytes, UA: NEGATIVE
Nitrite, UA: NEGATIVE
Protein, UA: NEGATIVE
Spec Grav, UA: >=1.03 — AB (ref 1.010–1.025)
Urobilinogen, UA: 0.2 E.U./dL
pH, UA: 5 (ref 5.0–8.0)

## 2022-10-31 NOTE — Patient Instructions (Signed)
Had elevated eosinophil count at last visit. CBC with diff repeated today. Other labs stable CXR has yet to be read but looks improved to me and clinically she is better. RTC in one year or as needed.

## 2022-11-07 ENCOUNTER — Other Ambulatory Visit: Payer: Self-pay | Admitting: Internal Medicine

## 2022-11-09 ENCOUNTER — Telehealth: Payer: Self-pay | Admitting: Internal Medicine

## 2022-11-09 NOTE — Telephone Encounter (Signed)
Patient was acutely ill on the day of her scheduled CPE and annual medicare wellness visit. The visit for her annual PE was postponed. Pt called to say she had gotten a bill for $400 and the claim was under review by insurance company. I have explained to her she did receive her annual medicare wellness visit that day on April 4th. She was seen April 18th for a sick visit and also in follow up April 29th. She got her health maintenance exam on May 14th. I think this will be clear to the coders. MJB, MD

## 2022-12-01 ENCOUNTER — Other Ambulatory Visit: Payer: Self-pay

## 2022-12-01 ENCOUNTER — Ambulatory Visit: Payer: Medicare Other | Admitting: Internal Medicine

## 2022-12-01 ENCOUNTER — Encounter: Payer: Self-pay | Admitting: Internal Medicine

## 2022-12-01 VITALS — BP 122/70 | HR 74 | Temp 98.4°F | Ht 64.17 in | Wt 164.1 lb

## 2022-12-01 DIAGNOSIS — J301 Allergic rhinitis due to pollen: Secondary | ICD-10-CM

## 2022-12-01 DIAGNOSIS — R053 Chronic cough: Secondary | ICD-10-CM | POA: Diagnosis not present

## 2022-12-01 DIAGNOSIS — B999 Unspecified infectious disease: Secondary | ICD-10-CM

## 2022-12-01 DIAGNOSIS — J3081 Allergic rhinitis due to animal (cat) (dog) hair and dander: Secondary | ICD-10-CM

## 2022-12-01 MED ORDER — FLUTICASONE PROPIONATE 50 MCG/ACT NA SUSP: 2.0000 | Freq: Every day | NASAL | 5 refills | Status: DC

## 2022-12-01 MED ORDER — FEXOFENADINE HCL 180 MG PO TABS
180.0000 mg | ORAL_TABLET | Freq: Every day | ORAL | 5 refills | Status: DC
Start: 2022-12-01 — End: 2023-01-31

## 2022-12-01 NOTE — Patient Instructions (Addendum)
Tracie Roach Return in about 2 months (around 01/31/2023).   Allergic Rhinitis:  - Positive skin test 11/2022: grasses, dogs  - Avoidance measures discussed. - Use nasal saline rinses before nose sprays such as with Neilmed Sinus Rinse.  Use distilled water.   - Use Flonase 2 sprays each nostril daily. Aim upward and outward. Do this at least during Spring/Fall.  Start in February.   - Use Allegra 180mg  daily during Spring and Fall.   - Consider allergy shots as long term control of your symptoms by teaching your immune system to be more tolerant of your allergy triggers   History of Wheezing - Wheezing/cough can occur with viral illness. No need to keep using Albuterol; this was just for temporary use. - Spirometry today was normal. Low suspicion for asthma   Recurrent Infections - If persistent, will consider obtaining immune workup labs. - Keep track of infection history and use of antibiotics going forward.   ALLERGEN AVOIDANCE MEASURES  Pollen Avoidance Pollen levels are highest during the mid-day and afternoon.  Consider this when planning outdoor activities. Avoid being outside when the grass is being mowed, or wear a mask if the pollen-allergic person must be the one to mow the grass. Keep the windows closed to keep pollen outside of the home. Use an air conditioner to filter the air. Take a shower, wash hair, and change clothing after working or playing outdoors during pollen season. Pet Dander Keep the pet out of your bedroom and restrict it to only a few rooms. Be advised that keeping the pet in only one room will not limit the allergens to that room. Don't pet, hug or kiss the pet; if you do, wash your hands with soap and water. High-efficiency particulate air (HEPA) cleaners run continuously in a bedroom or living room can reduce allergen levels over time. Regular use of a high-efficiency vacuum cleaner or a central vacuum can reduce allergen levels. Giving your pet a  bath at least once a week can reduce airborne allergen.

## 2022-12-01 NOTE — Progress Notes (Signed)
NEW PATIENT  Date of Service/Encounter:  12/01/22  Consult requested by: Margaree Mackintosh, MD   Subjective:   Tracie Roach (DOB: August 11, 1955) is a 67 y.o. female who presents to the clinic on 12/01/2022 with a chief complaint of Sinus Problem .    History obtained from: chart review and patient.   History of Wheezing: No history of asthma. Never previously required inhalers.  She is pretty active and even mows the lawn on her own; denies any SOB/wheezing with this. Did have an episode of lower respiratory tract infection with wheezing where she was prescribed Albuterol inhaler; used it temporarily and then stopped.  Also was given oral steroids due to persistent cough and wheezing.   Rhinitis:  Started years ago.  Symptoms include: nasal congestion, rhinorrhea, and post nasal drainage  Occurs seasonally-Spring/Fall  Potential triggers: pollen  Treatments tried:  Previously used PRN oral anti histamines.  Previous allergy testing: no History of reflux/heartburn: no History of sinus surgery: none Nonallergic triggers: none   Recurrent Infections: Recalls going to the PCP 4-5 times in April/May due to upper and lower respiratory tract infections.  Usually has sinus infections in Spring and Fall but does not always required antibiotics.   Does recall requiring IV antibiotics for kidney infection in 1970s. No other hospitalization for infections. Denies any hx of bone/joint/brain infections/abscesses.    Past Medical History: Past Medical History:  Diagnosis Date   Allergy    Anxiety    Arthritis    Cataract    beginnings of    Essential tremor    Functional constipation    Heart murmur    Hyperlipidemia    Hypertension    Stroke [facility]) 2014     Past Surgical History: Past Surgical History:  Procedure Laterality Date   APPENDECTOMY     NSVD     had sedation with 1st preg   Posterior vitreous detachment Left 08/13/15   benign   UPPER GASTROINTESTINAL ENDOSCOPY       Family History: Family History  Problem Relation Age of Onset   Allergic rhinitis Mother    Heart disease Mother    Eczema Sister    Asthma Paternal Uncle    Eczema Paternal Grandfather    Colon cancer Neg Hx    Colon polyps Neg Hx    Esophageal cancer Neg Hx    Rectal cancer Neg Hx    Stomach cancer Neg Hx    Breast cancer Neg Hx     Social History:  Lives in a 32 year house Flooring in bedroom: carpet Pets: cat Tobacco use/exposure: none Job: paperwork for business   Medication List:  Allergies as of 12/01/2022   No Known Allergies      Medication List        Accurate as of December 01, 2022  4:37 PM. If you have any questions, ask your nurse or doctor.          acetaminophen 500 MG tablet Commonly known as: TYLENOL Take 500 mg by mouth every 6 (six) hours as needed.   albuterol 108 (90 Base) MCG/ACT inhaler Commonly known as: VENTOLIN HFA INHALE 2 PUFFS INTO THE LUNGS EVERY 6 HOURS AS NEEDED FOR WHEEZING OR SHORTNESS OF BREATH   ALPRAZolam 0.5 MG tablet Commonly known as: XANAX TAKE 1/2 TABLET BY MOUTH AT BEDTIME AS NEEDED FOR SLEEP   amitriptyline 10 MG tablet Commonly known as: ELAVIL TAKE 2 TABLETS BY MOUTH EVERY NIGHT AT BEDTIME   amLODipine  5 MG tablet Commonly known as: NORVASC Take 1 tablet (5 mg total) by mouth daily.   aspirin EC 325 MG tablet Take 1 tablet (325 mg total) by mouth daily.   B-complex with vitamin C tablet Take 1 tablet by mouth daily.   butalbital-acetaminophen-caffeine 50-325-40 MG tablet Commonly known as: FIORICET TAKE 1 TO 2 TABLETS BY MOUTH EVERY 12 HOURS AS NEEDED FOR HEADACHE   CALCIUM PO Take by mouth.   cyclobenzaprine 10 MG tablet Commonly known as: FLEXERIL TAKE 1/2-1 TABLET AT BEDTIME AS NEEDED   estradiol 0.1 MG/GM vaginal cream Commonly known as: ESTRACE estradiol 0.01% (0.1 mg/gram) vaginal cream  INSERT 0.5 GRAM VAGINALLY TWICE A WEEK   fexofenadine 180 MG tablet Commonly known as: Allegra  Allergy Take 1 tablet (180 mg total) by mouth daily. Started by: Birder Robson, MD   fluticasone 50 MCG/ACT nasal spray Commonly known as: FLONASE Place 2 sprays into both nostrils daily. Started by: Birder Robson, MD   HYDROcodone bit-homatropine 5-1.5 MG/5ML syrup Commonly known as: HYCODAN Take 5 mLs by mouth every 8 (eight) hours as needed for cough.   levocetirizine 5 MG tablet Commonly known as: XYZAL Take 5 mg by mouth every evening.   losartan 100 MG tablet Commonly known as: COZAAR Take 1 tablet (100 mg total) by mouth daily.   magnesium gluconate 500 MG tablet Commonly known as: MAGONATE Take 1 tablet (500 mg total) by mouth daily.   metoprolol tartrate 25 MG tablet Commonly known as: LOPRESSOR Take 0.5 tablets (12.5 mg total) by mouth 2 (two) times daily.   multivitamin with minerals tablet Take 1 tablet by mouth daily.   Eye Vitamins Caps Take by mouth.   naproxen sodium 220 MG tablet Commonly known as: ALEVE Take 220 mg by mouth 2 (two) times daily as needed.   simvastatin 20 MG tablet Commonly known as: ZOCOR Take 1 tablet (20 mg total) by mouth at bedtime.   STOOL SOFTENER PO Take 1 tablet by mouth daily.   VITAMIN D3 PO Take by mouth.         REVIEW OF SYSTEMS: Pertinent positives and negatives discussed in HPI.   Objective:   Physical Exam: BP 122/70   Pulse 74   Temp 98.4 F (36.9 C) (Temporal)   Ht 5' 4.17" (1.63 m)   Wt 164 lb 1.6 oz (74.4 kg)   SpO2 97%   BMI 28.02 kg/m  Body mass index is 28.02 kg/m. GEN: alert, well developed HEENT: clear conjunctiva, TM grey and translucent, nose with + inferior turbinate hypertrophy, pink nasal mucosa, slight clear rhinorrhea, no cobblestoning HEART: regular rate and rhythm, no murmur LUNGS: clear to auscultation bilaterally, no coughing, unlabored respiration ABDOMEN: soft, non distended  SKIN: no rashes or lesions  Reviewed:  09/21/2022: seen for acute lower respiratory infection.  Given Rocephin and Augmentin and PRN Hycodan.  CXR showed patchy opacities.  Also noted to have wheezing on exam.   10/05/2022: seen for cough and R ear discomfort.  No wheezing on exam.  Given azithromycin, medrol dosepack.  10/16/2022: seen for congestion, hoarseness, cough, headaches. Wheezing and rhonchi on exam.  Given albuterol, Rocephi, doxycycline.  CXR with linear opacities but improved from last visit.  Spirometry:  Tracings reviewed. Her effort: Good reproducible efforts. FVC: 2.66L FEV1: 2.02L, 87% predicted FEV1/FVC ratio: 76% Interpretation: Spirometry consistent with normal pattern.  Please see scanned spirometry results for details.  Skin Testing:  Skin prick testing was placed, which includes aeroallergens/foods, histamine  control, and saline control.  Verbal consent was obtained prior to placing test.  Patient tolerated procedure well.  Allergy testing results were read and interpreted by myself, documented by clinical staff. Adequate positive and negative control.  Results discussed with patient/family.  Airborne Adult Perc - 12/01/22 1036     Time Antigen Placed 1036    Allergen Manufacturer Waynette Buttery    Location Back    Number of Test 55    Panel 1 Select    1. Control-Buffer 50% Glycerol Negative    2. Control-Histamine 3+    3. Bahia 2+    4. French Southern Territories Negative    5. Johnson Negative    6. Kentucky Blue Negative    7. Meadow Fescue Negative    8. Perennial Rye Negative    9. Timothy 2+    10. Ragweed Mix Negative    11. Cocklebur Negative    12. Plantain,  English Negative    13. Baccharis Negative    14. Dog Fennel Negative    15. Russian Thistle Negative    16. Lamb's Quarters Negative    17. Sheep Sorrell Negative    18. Rough Pigweed Negative    19. Marsh Elder, Rough Negative    20. Mugwort, Common Negative    21. Box, Elder Negative    22. Cedar, red Negative    23. Sweet Gum Negative    24. Pecan Pollen Negative    25. Pine Mix Negative    26.  Walnut, Black Pollen Negative    27. Red Mulberry Negative    28. Ash Mix Negative    29. Birch Mix Negative    30. Beech American Negative    31. Cottonwood, Guinea-Bissau Negative    32. Hickory, White Negative    33. Maple Mix Negative    34. Oak, Guinea-Bissau Mix Negative    35. Sycamore Eastern Negative    36. Alternaria Alternata Negative    37. Cladosporium Herbarum Negative    38. Aspergillus Mix Negative    39. Penicillium Mix Negative    40. Bipolaris Sorokiniana (Helminthosporium) Negative    42. Mucor Plumbeus Negative    43. Fusarium Moniliforme Negative    44. Aureobasidium Pullulans (pullulara) Negative    45. Rhizopus Oryzae Negative    46. Botrytis Cinera Negative    47. Epicoccum Nigrum Negative    48. Phoma Betae Negative    49. Dust Mite Mix Negative    50. Cat Hair 10,000 BAU/ml Negative    51.  Dog Epithelia 2+    52. Mixed Feathers Negative    53. Horse Epithelia Negative    54. Cockroach, German Negative    55. Tobacco Leaf Negative               Assessment:   1. Chronic cough   2. Recurrent infections   3. Seasonal allergic rhinitis due to pollen   4. Allergic rhinitis due to animal hair or dander     Plan/Recommendations:  Allergic Rhinitis: - Due to turbinate hypertrophy, seasonal symptoms and unresponsive to OTC meds, performed skin testing to identify aeroallergen triggers.   - Positive skin test 11/2022: grasses, dogs  - Avoidance measures discussed. - Use nasal saline rinses before nose sprays such as with Neilmed Sinus Rinse.  Use distilled water.   - Use Flonase 2 sprays each nostril daily. Aim upward and outward. Do this at least during Spring/Fall.  Start in February.   - Use Allegra 180mg  daily or  Xyzal 5mg  daily during Spring and Fall.   - Consider allergy shots as long term control of your symptoms by teaching your immune system to be more tolerant of your allergy triggers   History of Wheezing - Wheezing/cough can occur with  respiratory illness. No need to keep using Albuterol; this was just for temporary use. People can also have post infectious cough that lasts 6-8 weeks after an illness.   - Spirometry today was normal. Low suspicion for asthma   Recurrent Infections - If persistent, will consider obtaining immune workup labs. - Keep track of infection history and use of antibiotics going forward. - Did discuss that most upper and lower respiratory infections are viral which can occur frequently in general population and does not need immune evaluation.    Return in about 2 months (around 01/31/2023).  Alesia Morin, MD [facility]

## 2022-12-02 ENCOUNTER — Other Ambulatory Visit: Payer: Self-pay | Admitting: Internal Medicine

## 2022-12-22 ENCOUNTER — Other Ambulatory Visit: Payer: Self-pay | Admitting: Internal Medicine

## 2022-12-22 DIAGNOSIS — Z1231 Encounter for screening mammogram for malignant neoplasm of breast: Secondary | ICD-10-CM

## 2022-12-23 ENCOUNTER — Other Ambulatory Visit: Payer: Self-pay | Admitting: Internal Medicine

## 2023-01-26 ENCOUNTER — Ambulatory Visit
Admission: RE | Admit: 2023-01-26 | Discharge: 2023-01-26 | Disposition: A | Discharge status: Home / Self Care | Payer: Medicare Other | Source: Ambulatory Visit | Attending: Internal Medicine | Admitting: Internal Medicine

## 2023-01-26 DIAGNOSIS — Z1231 Encounter for screening mammogram for malignant neoplasm of breast: Secondary | ICD-10-CM | POA: Diagnosis not present

## 2023-01-31 ENCOUNTER — Ambulatory Visit: Payer: Medicare Other | Admitting: Internal Medicine

## 2023-01-31 ENCOUNTER — Other Ambulatory Visit: Payer: Self-pay

## 2023-01-31 ENCOUNTER — Encounter: Payer: Self-pay | Admitting: Internal Medicine

## 2023-01-31 VITALS — BP 124/66 | HR 83 | Temp 98.0°F | Resp 16 | Ht 64.0 in | Wt 171.8 lb

## 2023-01-31 DIAGNOSIS — B999 Unspecified infectious disease: Secondary | ICD-10-CM

## 2023-01-31 DIAGNOSIS — J3089 Other allergic rhinitis: Secondary | ICD-10-CM

## 2023-01-31 DIAGNOSIS — R053 Chronic cough: Secondary | ICD-10-CM | POA: Diagnosis not present

## 2023-01-31 DIAGNOSIS — J302 Other seasonal allergic rhinitis: Secondary | ICD-10-CM | POA: Diagnosis not present

## 2023-01-31 MED ORDER — FLUTICASONE PROPIONATE 50 MCG/ACT NA SUSP: 2.0000 | Freq: Every day | NASAL | 5 refills | Status: DC

## 2023-01-31 MED ORDER — FEXOFENADINE HCL 180 MG PO TABS: 180.0000 mg | ORAL_TABLET | Freq: Every day | ORAL | 5 refills | Status: DC

## 2023-01-31 NOTE — Progress Notes (Signed)
   FOLLOW UP Date of Service/Encounter:  01/31/23   Subjective:  Tracie Roach (DOB: [birth year]) is a 67 y.o. female who [facility] for follow up for chronic cough, recurrent infection and allergic rhinitis.   History obtained from: chart review and patient. Last visit was with me on 12/01/2022 and at the time was having trouble with recurrent upper and lower respiratory tract infections requiring oral steroids and Albuterol with minimal relief in April/May so was referred to Allergy for further evaluation.  Had a normal spirometry and SPT positive to few allergens.  Started on [employer] regularly.  Informed to hold off on Albuterol.  Since last visit, she reports doing well. She has not had trouble with that cough.  She also denies much issues with congestion, drainage, runny nose.  She is on Flonase and Allegra daily with plans to continue through Fall around October.  No infections or antibiotic use since last visit.   Past Medical History: Past Medical History:  Diagnosis Date   Allergy    Anxiety    Arthritis    Cataract    beginnings of    Essential tremor    Functional constipation    Heart murmur    Hyperlipidemia    Hypertension    Stroke ([facility]) 2014    Objective:  BP 124/66 (BP Location: Right Arm, Patient Position: Sitting, Cuff Size: Normal)   Pulse 83   Temp 98 F (36.7 C) (Temporal)   Resp 16   Ht 5\' 4"  (1.626 m)   Wt 171 lb 12.8 oz (77.9 kg)   SpO2 98%   BMI 29.49 kg/m  Body mass index is 29.49 kg/m. Physical Exam: GEN: alert, well developed HEENT: clear conjunctiva, TM grey and translucent, nose with mild inferior turbinate hypertrophy, pink nasal mucosa, no rhinorrhea, no cobblestoning HEART: regular rate and rhythm, no murmur LUNGS: clear to auscultation bilaterally, no coughing, unlabored respiration SKIN: no rashes or lesions   Assessment:   1. Seasonal and perennial allergic rhinitis   2.  Recurrent infections   3. Chronic cough     Plan/Recommendations:  Allergic Rhinitis Chronic Cough - Cough improved with treatment of rhinitis, possibly post infectious or related to PND.  Spirometry in the past has been normal- low suspicion for asthma.  - Positive skin test 11/2022: grasses, dogs  - Avoidance measures discussed. - Use nasal saline rinses before nose sprays such as with Neilmed Sinus Rinse.  Use distilled water.   - Use Flonase 2 sprays each nostril daily. Aim upward and outward. Do this at least during Spring/Fall.  Can stop after October but if symptoms worsen, restart it.  - Use Allegra 180mg  daily during Spring and Fall.  Can stop after October but if symptoms worsen, restart it.  - Consider allergy shots as long term control of your symptoms by teaching your immune system to be more tolerant of your allergy triggers  Recurrent Infections - Keep track of infection history and use of antibiotics going forward. - If persistent, will consider obtaining immune workup labs.      Return in about 6 months (around 08/03/2023).  Alesia Morin, MD [facility]

## 2023-01-31 NOTE — Patient Instructions (Addendum)
Tracie Roach Return in about 6 months (around 08/03/2023).   Allergic Rhinitis:  - Positive skin test 11/2022: grasses, dogs  - Avoidance measures discussed. - Use nasal saline rinses before nose sprays such as with Neilmed Sinus Rinse.  Use distilled water.   - Use Flonase 2 sprays each nostril daily. Aim upward and outward. Do this at least during Spring/Fall.  Can stop after October but if symptoms worsen, restart it.  - Use Allegra 180mg  daily during Spring and Fall.  Can stop after October but if symptoms worsen, restart it.  - Consider allergy shots as long term control of your symptoms by teaching your immune system to be more tolerant of your allergy triggers  Recurrent Infections - Keep track of infection history and use of antibiotics going forward.

## 2023-02-12 ENCOUNTER — Other Ambulatory Visit: Payer: Self-pay | Admitting: Internal Medicine

## 2023-02-13 DIAGNOSIS — H353131 Nonexudative age-related macular degeneration, bilateral, early dry stage: Secondary | ICD-10-CM | POA: Diagnosis not present

## 2023-02-13 DIAGNOSIS — H52223 Regular astigmatism, bilateral: Secondary | ICD-10-CM | POA: Diagnosis not present

## 2023-02-13 DIAGNOSIS — H2513 Age-related nuclear cataract, bilateral: Secondary | ICD-10-CM | POA: Diagnosis not present

## 2023-02-13 DIAGNOSIS — H43813 Vitreous degeneration, bilateral: Secondary | ICD-10-CM | POA: Diagnosis not present

## 2023-02-13 DIAGNOSIS — H35033 Hypertensive retinopathy, bilateral: Secondary | ICD-10-CM | POA: Diagnosis not present

## 2023-02-13 DIAGNOSIS — H524 Presbyopia: Secondary | ICD-10-CM | POA: Diagnosis not present

## 2023-03-28 ENCOUNTER — Other Ambulatory Visit: Payer: Self-pay | Admitting: Internal Medicine

## 2023-04-19 NOTE — Progress Notes (Signed)
Patient Care Team: Margaree Mackintosh, MD as PCP - General (Internal Medicine)  Visit Date: 04/26/23  Subjective:    Patient ID: Tracie Roach , Female   DOB: December 11, 1955, 67 y.o.    MRN: [id number]   67 y.o. Female presents today for a 79-month follow-up. History of allergic rhinitis, anxiety, arthritis, cataract, essential tremor, functional constipation, heart murmur, hyperlipidemia, hypertension, stroke in 2013-05-29.   History of anxiety treated with alprazolam 0.25 mg at bedtime as needed.   History of migraine headaches treated with Fioricet 50-325-40 mg one to two tablets every 12 hours as needed, amitriptyline 20 mg daily at bedtime. She is doing well with this regimen.   History of hyperlipidemia treated with simvastatin 20 mg daily at bedtime. Lipid panel normal. Liver functions normal.    History of hypertension treated with amlodipine 5 mg daily, losartan 100 mg daily, metoprolol tartrate 12.5 mg twice daily. Blood pressure normal in-office today at 120/80.   History of right thalamic stroke in 05/29/2013. Has persistent left upper extremity numbness and tightness treated with Flexeril and occasional hydrocodone APAP tablet.  History of allergic rhinitis, chronic cough. Had positive skin test 11/2022 for grasses, dogs. Treated with Allegra 180 mg daily as needed, Flonase, nasal saline rinse. Followed by allergist, Dr. Alesia Morin.   She had COVID-19 in August 2022.   Appendectomy April 2011. History of vitreous detachment of left eye. She wears glasses.  Occasionally has a urinary infection. Seen by OB/GYN, Dr. Rana Snare regarding atrophic vaginitis.   Mammogram normal on 01/29/23.   Colonoscopy last completed 05/03/17. Results showed entire examined colon is normal. Recommended repeat in 2027/05/30.  Past Medical History:  Diagnosis Date   Allergy    Anxiety    Arthritis    Cataract    beginnings of    Essential tremor    Functional constipation    Heart murmur    Hyperlipidemia     Hypertension    Stroke [facility]) 2014     Family History  Problem Relation Age of Onset   Allergic rhinitis Mother    Heart disease Mother    Eczema Sister    Asthma Paternal Uncle    Eczema Paternal Grandfather    Colon cancer Neg Hx    Colon polyps Neg Hx    Esophageal cancer Neg Hx    Rectal cancer Neg Hx    Stomach cancer Neg Hx    Breast cancer Neg Hx     Social History   Social History Narrative          Social history: She is married with 2 adult children.  Several grandchildren.  Does not smoke or consume alcohol.  Quit smoking in March 2013.  Used to smoke 6 or 7 cigarettes daily.  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 Patient has 2 years of community college.       Family history: Father died in 2008-05-29 with pulmonary fibrosis.  Mother is living but doing poorly with history of CABG and valve replacement.  1 sister in good health.  No brothers.      Review of Systems  Constitutional:  Negative for fever and malaise/fatigue.  HENT:  Negative for congestion.   Eyes:  Negative for blurred vision.  Respiratory:  Negative for cough and shortness of breath.   Cardiovascular:  Negative for chest pain, palpitations and leg swelling.  Gastrointestinal:  Negative  for vomiting.  Musculoskeletal:  Negative for back pain.  Skin:  Negative for rash.  Neurological:  Negative for loss of consciousness and headaches.        Objective:   Vitals: BP 120/80   Pulse 75   Ht 5\' 4"  (1.626 m)   Wt 170 lb (77.1 kg)   SpO2 97%   BMI 29.18 kg/m    Physical Exam Vitals and nursing note reviewed.  Constitutional:      General: She is not in acute distress.    Appearance: Normal appearance. She is not toxic-appearing.  HENT:     Head: Normocephalic and atraumatic.     Right Ear: Hearing, tympanic membrane, ear canal and external ear normal.     Left Ear: Hearing, tympanic membrane, ear canal and  external ear normal.     Ears:     Comments: Some cerumen bilateral external ear canals. Cardiovascular:     Rate and Rhythm: Normal rate and regular rhythm. No extrasystoles are present.    Pulses: Normal pulses.     Heart sounds: Normal heart sounds. No murmur heard.    No friction rub. No gallop.  Pulmonary:     Effort: Pulmonary effort is normal. No respiratory distress.     Breath sounds: Normal breath sounds. No wheezing or rales.  Skin:    General: Skin is warm and dry.  Neurological:     Mental Status: She is alert and oriented to person, place, and time. Mental status is at baseline.  Psychiatric:        Mood and Affect: Mood normal.        Behavior: Behavior normal.        Thought Content: Thought content normal.        Judgment: Judgment normal.       Results:   Studies obtained and personally reviewed by me:  Mammogram normal on 01/29/23.   Colonoscopy last completed 05/03/17. Results showed entire examined colon is normal. Recommended repeat in 2028.  Labs:       Component Value Date/Time   NA 143 09/14/2022 0922   K 4.6 09/14/2022 0922   CL 104 09/14/2022 0922   CO2 31 09/14/2022 0922   GLUCOSE 97 09/14/2022 0922   BUN 13 09/14/2022 0922   CREATININE 0.81 09/14/2022 0922   CALCIUM 9.5 09/14/2022 0922   PROT 7.2 04/23/2023 0954   ALBUMIN 4.3 02/20/2017 1055   AST 21 04/23/2023 0954   ALT 17 04/23/2023 0954   ALKPHOS 114 02/20/2017 1055   BILITOT 0.4 04/23/2023 0954   GFRNONAA 66 09/09/2020 0905   GFRAA 76 09/09/2020 0905     Lab Results  Component Value Date   WBC 10.0 10/31/2022   HGB 14.3 10/31/2022   HCT 43.1 10/31/2022   MCV 91.3 10/31/2022   PLT 385 10/31/2022    Lab Results  Component Value Date   CHOL 172 04/23/2023   HDL 50 04/23/2023   LDLCALC 99 04/23/2023   TRIG 134 04/23/2023   CHOLHDL 3.4 04/23/2023    Lab Results  Component Value Date   HGBA1C 5.4 03/10/2022     Lab Results  Component Value Date   TSH 3.23  09/14/2022      Assessment & Plan:   Anxiety: stable with alprazolam 0.25 mg at bedtime as needed.   Migraine headaches: treated with Fioricet 50-325-40 mg one to two tablets every 12 hours as needed, amitriptyline 20 mg daily at bedtime. She is doing well with this  regimen. Refilled Fioricet.   Hyperlipidemia: treated with simvastatin 20 mg daily at bedtime. Lipid panel normal. Liver functions normal.    Hypertension: treated with amlodipine 5 mg daily, losartan 100 mg daily, metoprolol tartrate 12.5 mg twice daily. Blood pressure normal in-office today at 120/80.  Allergic rhinitis, chronic cough: had positive skin test 11/2022 for grasses, dogs. Treated with Allegra 180 mg daily as needed, Flonase, nasal saline rinse. Followed by allergist, Dr. Alesia Morin.  Mammogram normal on 01/29/23.   Colonoscopy last completed 05/03/17. Results showed entire examined colon is normal. Recommended repeat in 2028.  Vaccine counseling: administered flu booster.   Return in 6 months for health maintenance exam or as needed.    I,Alexander Ruley,acting as a Neurosurgeon for Margaree Mackintosh, MD.,have documented all relevant documentation on the behalf of Margaree Mackintosh, MD,as directed by  Margaree Mackintosh, MD while in the presence of Margaree Mackintosh, MD.   I, Margaree Mackintosh, MD, have reviewed all documentation for this visit. The documentation on 05/06/23 for the exam, diagnosis, procedures, and orders are all accurate and complete.

## 2023-04-23 ENCOUNTER — Other Ambulatory Visit: Payer: Medicare Other

## 2023-04-23 DIAGNOSIS — E7800 Pure hypercholesterolemia, unspecified: Secondary | ICD-10-CM | POA: Diagnosis not present

## 2023-04-24 LAB — LIPID PANEL
Cholesterol: 172 mg/dL (ref <200)
HDL: 50 mg/dL (ref >=50)
LDL Cholesterol (Calc): 99 mg/dL
Non-HDL Cholesterol (Calc): 122 mg/dL (ref <130)
Total CHOL/HDL Ratio: 3.4 (calc) (ref <5.0)
Triglycerides: 134 mg/dL (ref <150)

## 2023-04-24 LAB — HEPATIC FUNCTION PANEL
AG Ratio: 1.7 (calc) (ref 1.0–2.5)
ALT: 17 U/L (ref 6–29)
AST: 21 U/L (ref 10–35)
Albumin: 4.5 g/dL (ref 3.6–5.1)
Alkaline phosphatase (APISO): 131 U/L (ref 37–153)
Bilirubin, Direct: 0.1 mg/dL (ref 0.0–0.2)
Globulin: 2.7 g/dL (ref 1.9–3.7)
Indirect Bilirubin: 0.3 mg/dL (ref 0.2–1.2)
Total Bilirubin: 0.4 mg/dL (ref 0.2–1.2)
Total Protein: 7.2 g/dL (ref 6.1–8.1)

## 2023-04-26 ENCOUNTER — Encounter: Payer: Self-pay | Admitting: Internal Medicine

## 2023-04-26 ENCOUNTER — Ambulatory Visit (INDEPENDENT_AMBULATORY_CARE_PROVIDER_SITE_OTHER): Payer: Medicare Other | Admitting: Internal Medicine

## 2023-04-26 VITALS — BP 120/80 | HR 75 | Ht 64.0 in | Wt 170.0 lb

## 2023-04-26 DIAGNOSIS — F419 Anxiety disorder, unspecified: Secondary | ICD-10-CM

## 2023-04-26 DIAGNOSIS — J3081 Allergic rhinitis due to animal (cat) (dog) hair and dander: Secondary | ICD-10-CM

## 2023-04-26 DIAGNOSIS — I6930 Unspecified sequelae of cerebral infarction: Secondary | ICD-10-CM | POA: Diagnosis not present

## 2023-04-26 DIAGNOSIS — R202 Paresthesia of skin: Secondary | ICD-10-CM

## 2023-04-26 DIAGNOSIS — I10 Essential (primary) hypertension: Secondary | ICD-10-CM | POA: Diagnosis not present

## 2023-04-26 DIAGNOSIS — Z23 Encounter for immunization: Secondary | ICD-10-CM

## 2023-04-26 DIAGNOSIS — Z8669 Personal history of other diseases of the nervous system and sense organs: Secondary | ICD-10-CM | POA: Diagnosis not present

## 2023-04-26 DIAGNOSIS — E7800 Pure hypercholesterolemia, unspecified: Secondary | ICD-10-CM

## 2023-04-26 DIAGNOSIS — J301 Allergic rhinitis due to pollen: Secondary | ICD-10-CM

## 2023-04-27 ENCOUNTER — Other Ambulatory Visit: Payer: Self-pay | Admitting: Internal Medicine

## 2023-05-06 NOTE — Patient Instructions (Signed)
It was a pleasure to see you today.  Labs are stable.  Please continue current medications.  Flu vaccine given today.  Return in 6 months for Medicare wellness and health maintenance exam.

## 2023-05-22 ENCOUNTER — Other Ambulatory Visit: Payer: Self-pay | Admitting: Family

## 2023-06-06 DIAGNOSIS — K080 Exfoliation of teeth due to systemic causes: Secondary | ICD-10-CM | POA: Diagnosis not present

## 2023-06-14 ENCOUNTER — Other Ambulatory Visit: Payer: Self-pay | Admitting: Internal Medicine

## 2023-06-27 ENCOUNTER — Ambulatory Visit
Admission: RE | Admit: 2023-06-27 | Discharge: 2023-06-27 | Disposition: A | Discharge status: Home / Self Care | Payer: Medicare Other | Source: Ambulatory Visit | Attending: Internal Medicine | Admitting: Internal Medicine

## 2023-06-27 ENCOUNTER — Other Ambulatory Visit: Payer: Self-pay | Admitting: Internal Medicine

## 2023-06-27 DIAGNOSIS — M8588 Other specified disorders of bone density and structure, other site: Secondary | ICD-10-CM | POA: Diagnosis not present

## 2023-06-27 DIAGNOSIS — E2839 Other primary ovarian failure: Secondary | ICD-10-CM | POA: Diagnosis not present

## 2023-06-27 DIAGNOSIS — N958 Other specified menopausal and perimenopausal disorders: Secondary | ICD-10-CM | POA: Diagnosis not present

## 2023-06-29 ENCOUNTER — Other Ambulatory Visit: Payer: Self-pay | Admitting: Internal Medicine

## 2023-07-14 ENCOUNTER — Other Ambulatory Visit: Payer: Self-pay | Admitting: Family

## 2023-07-23 ENCOUNTER — Ambulatory Visit: Payer: Self-pay | Admitting: Internal Medicine

## 2023-07-23 ENCOUNTER — Ambulatory Visit (INDEPENDENT_AMBULATORY_CARE_PROVIDER_SITE_OTHER): Payer: Medicare Other | Admitting: Internal Medicine

## 2023-07-23 VITALS — BP 130/80 | HR 87 | Temp 97.8°F | Resp 16 | Ht 64.0 in | Wt 170.0 lb

## 2023-07-23 DIAGNOSIS — J020 Streptococcal pharyngitis: Secondary | ICD-10-CM

## 2023-07-23 DIAGNOSIS — R6889 Other general symptoms and signs: Secondary | ICD-10-CM | POA: Diagnosis not present

## 2023-07-23 DIAGNOSIS — R059 Cough, unspecified: Secondary | ICD-10-CM

## 2023-07-23 LAB — POCT RAPID STREP A (OFFICE): Rapid Strep A Screen: POSITIVE — AB

## 2023-07-23 MED ORDER — FLUCONAZOLE 150 MG PO TABS: 150.0000 mg | ORAL_TABLET | Freq: Once | ORAL | 0 refills | Status: AC

## 2023-07-23 MED ORDER — AMOXICILLIN 500 MG PO CAPS: 500.0000 mg | ORAL_CAPSULE | Freq: Three times a day (TID) | ORAL | 0 refills | Status: AC

## 2023-07-23 MED ORDER — HYDROCODONE BIT-HOMATROP MBR 5-1.5 MG/5ML PO SOLN: 5.0000 mL | Freq: Three times a day (TID) | ORAL | 0 refills | Status: DC | PRN

## 2023-07-23 NOTE — Telephone Encounter (Signed)
  Reason for Disposition  Earache  Answer Assessment - Initial Assessment Questions 1. LOCATION: "Where does it hurt?"      Head  2. ONSET: "When did the sinus pain start?"  (e.g., hours, days)      Friday  3. SEVERITY: "How bad is the pain?"   (Scale 1-10; mild, moderate or severe)   - MILD (1-3): doesn't interfere with normal activities    - MODERATE (4-7): interferes with normal activities (e.g., work or school) or awakens from sleep   - SEVERE (8-10): excruciating pain and patient unable to do any normal activities        8/10  4. NASAL CONGESTION: "Is the nose blocked?" If Yes, ask: "Can you open it or must you breathe through your mouth?"     Yes  5. FEVER: "Do you have a fever?" If Yes, ask: "What is it, how was it measured, and when did it start?"      99.6 yesterday, does not feel feverish right away   6 OTHER SYMPTOMS: "Do you have any other symptoms?" (e.g., sore throat, cough, earache, difficulty breathing)     Congestion, Breathing okay, feels like right ear throbbing, had sore throat yesterday  Protocols used: Sinus Pain or Congestion-A-AH  Chief Complaint: Sinus pain Symptoms: Congestion, sinus pain, productive cough Pertinent Negatives: Patient denies fever Disposition: [] ED /[] Urgent Care (no appt availability in office) / [x] Appointment(In office/virtual)/ []  [facility]/ [] Home Care/ [] Refused Recommended Disposition /[] [facility]/ []  Follow-up with PCP Additional Notes: Patient reported that she is having congestion, cough, and sinus headache since Friday. Patient has appt scheduled today.

## 2023-07-23 NOTE — Progress Notes (Signed)
Patient Care Team: Margaree Mackintosh, MD as PCP - General (Internal Medicine)  Visit Date: 07/23/23  Subjective:   Chief Complaint  Patient presents with   Cough    Cough and Congestion   Patient ZO:XWRUE R Gettinger,Female DOB:[birth year],68 y.o. AVW:[id number]   68 y.o. Female presents today for acute sick visit with Cough, Congestion, and Sinus Pain. Patient has a past medical history of Allergies. From the triage note: "Chief Complaint: Sinus pain Symptoms: Congestion, sinus pain, productive cough LOCATION: Head ONSET: Friday SEVERITY: 8/10 NASAL CONGESTION: Yes FEVER: 99.6 yesterday, does not feel feverish right away  OTHER SYMPTOMS: Congestion, Breathing okay, feels like right ear throbbing, had sore throat yesterday"  Reports that she took an at-home Covid test, which was NEG. UTD on Flu and RSV. Endorses headache and chills. Has been using Allegra and Flonase daily per allergist recommendation. Notes sick contact.   Past Medical History:  Diagnosis Date   Allergy    Anxiety    Arthritis    Cataract    beginnings of    Essential tremor    Functional constipation    Heart murmur    Hyperlipidemia    Hypertension    Stroke (HCC) 2014  No Known Allergies  Family History  Problem Relation Age of Onset   Allergic rhinitis Mother    Heart disease Mother    Eczema Sister    Asthma Paternal Uncle    Eczema Paternal Grandfather    Colon cancer Neg Hx    Colon polyps Neg Hx    Esophageal cancer Neg Hx    Rectal cancer Neg Hx    Stomach cancer Neg Hx    Breast cancer Neg Hx    Social History   Social History Narrative          Social history: She is married with 2 adult children.  Several grandchildren.  Does not smoke or consume alcohol.  Quit smoking in March 2013.  Used to smoke 6 or 7 cigarettes daily.  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 Patient has 2 years of community  college.       Family history: Father died in 2007-08-17 with pulmonary fibrosis.  Mother is living but doing poorly with history of CABG and valve replacement.  1 sister in good health.  No brothers.   Review of Systems  Constitutional:  Positive for chills. Negative for fever.       (+) Elevated Temperature - 99.6  HENT:  Positive for congestion (sinus, nasal), ear pain (right), sinus pain (8/10) and sore throat.   Respiratory:  Positive for cough (productive) and sputum production.   Neurological:  Positive for headaches.     Objective:  Vitals: Resp 16   Ht 5\' 4"  (1.626 m)   BMI 29.18 kg/m   Physical Exam HENT:     Right Ear: Tympanic membrane, ear canal and external ear normal.     Left Ear: Tympanic membrane, ear canal and external ear normal.     Mouth/Throat:     Pharynx: Posterior oropharyngeal erythema present.     Results:  Studies Obtained And Personally Reviewed By Me: Labs:     Component Value Date/Time   NA 143 09/14/2022 0922   K 4.6 09/14/2022 0922   CL 104 09/14/2022 0922   CO2 31 09/14/2022 0922   GLUCOSE 97 09/14/2022 0922   BUN 13 09/14/2022 8295  CREATININE 0.81 09/14/2022 0922   CALCIUM 9.5 09/14/2022 0922   PROT 7.2 04/23/2023 0954   ALBUMIN 4.3 02/20/2017 1055   AST 21 04/23/2023 0954   ALT 17 04/23/2023 0954   ALKPHOS 114 02/20/2017 1055   BILITOT 0.4 04/23/2023 0954   GFRNONAA 66 09/09/2020 0905   GFRAA 76 09/09/2020 0905    Lab Results  Component Value Date   WBC 10.0 10/31/2022   HGB 14.3 10/31/2022   HCT 43.1 10/31/2022   MCV 91.3 10/31/2022   PLT 385 10/31/2022   Lab Results  Component Value Date   CHOL 172 04/23/2023   HDL 50 04/23/2023   LDLCALC 99 04/23/2023   TRIG 134 04/23/2023   CHOLHDL 3.4 04/23/2023   Lab Results  Component Value Date   HGBA1C 5.4 03/10/2022    Lab Results  Component Value Date   TSH 3.23 09/14/2022    Assessment & Plan:   Streptococcal Pharyngitis: Strep POS. Sending in 500 mg Amoxicillin -  take 3 tablets per day for 10 days, Hycodan syrup for cough - take 1 teaspoon every 8 hours as needed, and 150 mg Diflucan in case you develop candida vaginitis. You will be contagious for 48 hours after starting treatment, consider quarantining for that duration or if you can not, wear a mask in public spaces. Stay well rested, well hydrated, and well nourished. Walk around some to prevent atelectasis. Contact us if symptoms worsen/persist despite treatment.       I,Emily Lagle,acting as a Neurosurgeon for Margaree Mackintosh, MD.,have documented all relevant documentation on the behalf of Margaree Mackintosh, MD,as directed by  Margaree Mackintosh, MD while in the presence of Margaree Mackintosh, MD.   I, Margaree Mackintosh, MD, have reviewed all documentation for this visit. The documentation on 08/01/23 for the exam, diagnosis, procedures, and orders are all accurate and complete.

## 2023-07-23 NOTE — Telephone Encounter (Signed)
Changed patients appointment for today.

## 2023-07-24 ENCOUNTER — Ambulatory Visit: Payer: Medicare Other | Admitting: Internal Medicine

## 2023-08-01 ENCOUNTER — Encounter: Payer: Self-pay | Admitting: Internal Medicine

## 2023-08-01 NOTE — Patient Instructions (Addendum)
You have been diagnosed with strep pharyngitis. Please take Amoxicillin 500 mg 3 times a day for 10 days.take Hycodan as needed for cough or sore throat pain.may take diflucan if needed for candida vaginitis.

## 2023-08-03 ENCOUNTER — Encounter: Payer: Self-pay | Admitting: Internal Medicine

## 2023-08-03 ENCOUNTER — Ambulatory Visit: Payer: Medicare Other | Admitting: Internal Medicine

## 2023-08-03 VITALS — BP 126/60 | HR 70 | Temp 98.0°F | Resp 16

## 2023-08-03 DIAGNOSIS — J302 Other seasonal allergic rhinitis: Secondary | ICD-10-CM | POA: Diagnosis not present

## 2023-08-03 DIAGNOSIS — J3089 Other allergic rhinitis: Secondary | ICD-10-CM

## 2023-08-03 DIAGNOSIS — B999 Unspecified infectious disease: Secondary | ICD-10-CM | POA: Diagnosis not present

## 2023-08-03 MED ORDER — FEXOFENADINE HCL 180 MG PO TABS: 180.0000 mg | ORAL_TABLET | Freq: Every day | ORAL | 5 refills | Status: DC

## 2023-08-03 MED ORDER — AZELASTINE HCL 0.1 % NA SOLN
2.0000 | Freq: Two times a day (BID) | NASAL | 5 refills | Status: DC | PRN
Start: 2023-08-03 — End: 2024-02-15

## 2023-08-03 MED ORDER — FLUTICASONE PROPIONATE 50 MCG/ACT NA SUSP: 2.0000 | Freq: Every day | NASAL | 5 refills | Status: DC

## 2023-08-03 NOTE — Progress Notes (Signed)
   FOLLOW UP Date of Service/Encounter:  08/03/23   Subjective:  Tracie Roach (DOB: 06/06/1956) is a 68 y.o. female who [facility] for follow up for allergic rhinitis and recurrent infections.   History obtained from: chart review and patient. Last visit was with me on 01/31/2023 and at the time was doing well on Flonase/Allegra.  Since last visit, she was seen for cough, congestion, sinus pain.  Felt mucous in throat but no sore throat.  Rapid strep positive at PCP office on 2/3, started on Augmentin for 10 days. Feeling better but with some drainage, runny nose and congestion.  No other antibiotic use or infections. Using Flonase and Allegra daily.  Notes Spring/Summer is generally worse.  Past Medical History: Past Medical History:  Diagnosis Date   Allergy    Anxiety    Arthritis    Cataract    beginnings of    Essential tremor    Functional constipation    Heart murmur    Hyperlipidemia    Hypertension    Stroke (HCC) 2014    Objective:  BP 126/60 (BP Location: Right Arm, Patient Position: Sitting, Cuff Size: Normal)   Pulse 70   Temp 98 F (36.7 C) (Temporal)   Resp 16   SpO2 98%  There is no height or weight on file to calculate BMI. Physical Exam: GEN: alert, well developed HEENT: clear conjunctiva, nose without inferior turbinate hypertrophy, pink nasal mucosa, + clear rhinorrhea, + cobblestoning HEART: regular rate and rhythm, no murmur LUNGS: clear to auscultation bilaterally, no coughing, unlabored respiration SKIN: no rashes or lesions   Assessment:   1. Seasonal and perennial allergic rhinitis   2. Recurrent infections     Plan/Recommendations:  Allergic Rhinitis:  - Recent worsening of rhinitis due to infection, otherwise doing well.  Spring/summer is generally worse so discussed PRN addition of Azelastine.  - Positive skin test 11/2022: grasses, dogs  - Avoidance measures discussed. - Use nasal saline  rinses before nose sprays such as with Neilmed Sinus Rinse.  Use distilled water.   - Use Flonase 2 sprays each nostril daily. Aim upward and outward. - Use Azelastine 2 sprays each nostril twice daily as needed for congestion/drainage/sneezing/runny nose.  Aim upward and outward.   - Use Allegra 180mg  daily . - Consider allergy shots as long term control of your symptoms by teaching your immune system to be more tolerant of your allergy triggers  Recurrent Infections - Keep track of infection history and use of antibiotic use. - Augmentin for Strep A in 07/23/2023 but presented with cough, no sore throat so possibly a viral infection.        Return in about 6 months (around 01/31/2024).  Alesia Morin, MD [facility]

## 2023-08-03 NOTE — Patient Instructions (Addendum)
Allergic Rhinitis:  - Positive skin test 11/2022: grasses, dogs  - Use nasal saline rinses before nose sprays such as with Neilmed Sinus Rinse.  Use distilled water.   - Use Flonase 2 sprays each nostril daily. Aim upward and outward. - Use Azelastine 2 sprays each nostril twice daily as needed for congestion/drainage/sneezing/runny nose.  Aim upward and outward.   - Use Allegra 180mg  daily . - Consider allergy shots as long term control of your symptoms by teaching your immune system to be more tolerant of your allergy triggers  Recurrent Infections - Keep track of infection history and use of antibiotic use.

## 2023-09-24 ENCOUNTER — Other Ambulatory Visit: Payer: Medicare Other

## 2023-09-24 DIAGNOSIS — I10 Essential (primary) hypertension: Secondary | ICD-10-CM | POA: Diagnosis not present

## 2023-09-24 DIAGNOSIS — Z0000 Encounter for general adult medical examination without abnormal findings: Secondary | ICD-10-CM

## 2023-09-24 DIAGNOSIS — E7849 Other hyperlipidemia: Secondary | ICD-10-CM

## 2023-09-25 LAB — CBC WITH DIFFERENTIAL/PLATELET
Absolute Lymphocytes: 2254 {cells}/uL (ref 850–3900)
Absolute Monocytes: 538 {cells}/uL (ref 200–950)
Basophils Absolute: 47 {cells}/uL (ref 0–200)
Basophils Relative: 0.6 %
Eosinophils Absolute: 281 {cells}/uL (ref 15–500)
Eosinophils Relative: 3.6 %
HCT: 41.3 % (ref 35.0–45.0)
Hemoglobin: 13.9 g/dL (ref 11.7–15.5)
MCH: 30.7 pg (ref 27.0–33.0)
MCHC: 33.7 g/dL (ref 32.0–36.0)
MCV: 91.2 fL (ref 80.0–100.0)
MPV: 10.9 fL (ref 7.5–12.5)
Monocytes Relative: 6.9 %
Neutro Abs: 4680 {cells}/uL (ref 1500–7800)
Neutrophils Relative %: 60 %
Platelets: 322 10*3/uL (ref 140–400)
RBC: 4.53 10*6/uL (ref 3.80–5.10)
RDW: 12.1 % (ref 11.0–15.0)
Total Lymphocyte: 28.9 %
WBC: 7.8 10*3/uL (ref 3.8–10.8)

## 2023-09-25 LAB — COMPLETE METABOLIC PANEL WITHOUT GFR
AG Ratio: 1.6 (calc) (ref 1.0–2.5)
ALT: 16 U/L (ref 6–29)
AST: 22 U/L (ref 10–35)
Albumin: 4.4 g/dL (ref 3.6–5.1)
Alkaline phosphatase (APISO): 130 U/L (ref 37–153)
BUN: 16 mg/dL (ref 7–25)
CO2: 32 mmol/L (ref 20–32)
Calcium: 10.1 mg/dL (ref 8.6–10.4)
Chloride: 103 mmol/L (ref 98–110)
Creat: 0.88 mg/dL (ref 0.50–1.05)
Globulin: 2.8 g/dL (ref 1.9–3.7)
Glucose, Bld: 95 mg/dL (ref 65–99)
Potassium: 5 mmol/L (ref 3.5–5.3)
Sodium: 143 mmol/L (ref 135–146)
Total Bilirubin: 0.4 mg/dL (ref 0.2–1.2)
Total Protein: 7.2 g/dL (ref 6.1–8.1)

## 2023-09-25 LAB — LIPID PANEL
Cholesterol: 177 mg/dL (ref <200)
HDL: 47 mg/dL — ABNORMAL LOW (ref >=50)
LDL Cholesterol (Calc): 109 mg/dL — ABNORMAL HIGH
Non-HDL Cholesterol (Calc): 130 mg/dL — ABNORMAL HIGH (ref <130)
Total CHOL/HDL Ratio: 3.8 (calc) (ref <5.0)
Triglycerides: 106 mg/dL (ref <150)

## 2023-09-25 LAB — TSH: TSH: 2.84 m[IU]/L (ref 0.40–4.50)

## 2023-09-25 NOTE — Progress Notes (Signed)
 Annual Wellness Visit   Patient Care Team: Sylvan Evener, MD as PCP - General (Internal Medicine)  Visit Date: 09/28/23   Chief Complaint  Patient presents with   Annual Exam   Subjective:  Patient: Tracie Roach, Female DOB: 1955/11/24, 68 y.o. MRN: [id number] Tracie Roach is a 68 y.o. Female who presents today for her Annual Wellness Visit. Patient has history of Functional Constipation; Essential Tremor; History of Migraine Headaches; Hyperlipidemia; Generalized Anxiety Disorder; Disturbance of Skin Sensation; Essential Hypertension, Benign; History of Ischemic Vertebrobasilar Artery Thalamic Stroke; Decreased Libido; and Paresthesia of Left Arm.  History of Hypertension treated with Amlodipine 5 mg, Losartan 100 mg daily, and Metoprolol tartrate 12.5 mg twice daily. Blood Pressure: normotensive today at 138/82.  History of Hyperlipidemia treated with  Simvastatin 20 mg nightly. 09/24/2023 Lipid Panel, compared to 08/2022: HDL 47, decreased from 55; LDL 109, elevated from 79.  History of Migraine Headaches treated with Fioricet 50-325-40 mg, 1 - 2 tablets every 12 hours as needed. and Elavil 20 mg at bedtime.   History of Anxiety treated with Xanax 0.25 mg at bedtime as needed.   History of Vitreous Detachment, Left Eye in 2017 managed with eyeglasses.   History of Right Thalamic Stroke in 2014; Subsequent Musculoskeletal Pain w/ persistent left upper extremity numbness treated with Flexeril 5 - 10 mg as needed and in the past has used Hydrocodone APAP tablet occasionally.   Started seeing Dr. Kristen Petri, Allergist in 11/2022. Skin test positive for grasses and dog dander. Spirometry normal. Seasonal/Perennial Allergic Rhinitis managed with Flonase 2 sprays into both nostrils daily, Allegra Allergy 180 mg daily, and Astelin 0.1 nasal spray twice into both nostrils as needed.   Labs 09/24/2023 CBC: WNL CMP: WNL  TSH: 2.84  PAP Smear deferred due to age - last done in 2021 was  normal.   Mammogram 01/26/2023  normal  with repeat recommendation of 2025.  Colonoscopy 05/03/2017 normal throughout the entire examined colon with repeat recommendation of 2028.  Bone Density 06/27/2023  T-score Forearm Radius -1.2, osteopenic. History of Osteopenia managed with Calcium daily and Vitamin-D 5000 units daily.  Vaccine Counseling: Due for UTD on Flu, Shingles 1/2, PNA, and Tdap. Past Medical History:  Diagnosis Date   Allergy    Anxiety    Arthritis    Cataract    beginnings of    Essential tremor    Functional constipation    Heart murmur    Hyperlipidemia    Hypertension    Stroke [facility]) 2014  Medical/Surgical History Narrative:  Allergic/Intolerant to: Grass Pollen ([id number] Vern); Dog Dander  2022 - had Covid-19 in August  2014 - Right Thalamic Stroke  2011 - Appendectomy in April  Other - Surghx of: NSVD 1st pregnancy; Upper GI Endoscopy 2018 Family History  Problem Relation Age of Onset   Allergic rhinitis Mother    Heart disease Mother    Eczema Sister    Asthma Paternal Uncle    Eczema Paternal Grandfather    Colon cancer Neg Hx    Colon polyps Neg Hx    Esophageal cancer Neg Hx    Rectal cancer Neg Hx    Stomach cancer Neg Hx    Breast cancer Neg Hx    Social History   Social History Narrative          Social history: She is married with 2 adult children.  Several grandchildren.  Does not smoke or consume alcohol.  Quit smoking in March  Oct 28, 2011.  Used to smoke 6 or 7 cigarettes daily.  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 Patient has 2 years of community college.       Family history: Father died in Oct 28, 2007 with pulmonary fibrosis.  Mother is living but doing poorly with history of CABG and valve replacement.  1 sister in good health.  No brothers.   Review of Systems  Constitutional:  Negative for chills, fever, malaise/fatigue and weight loss.  HENT:   Negative for hearing loss, sinus pain and sore throat.   Respiratory:  Negative for cough, hemoptysis and shortness of breath.   Cardiovascular:  Negative for chest pain, palpitations, leg swelling and PND.       (+) Other LE Edema - occasionally in feet after extensive time on them  Gastrointestinal:  Negative for abdominal pain, constipation, diarrhea, heartburn, nausea and vomiting.  Genitourinary:  Negative for dysuria, frequency and urgency.  Musculoskeletal:  Negative for back pain, myalgias and neck pain.  Skin:  Negative for itching and rash.  Neurological:  Negative for dizziness, tingling, seizures and headaches.  Endo/Heme/Allergies:  Negative for polydipsia.  Psychiatric/Behavioral:  Negative for depression. The patient is not nervous/anxious.     Objective:  Vitals: BP 138/82   Pulse 77   Temp 98.2 F (36.8 C) (Temporal)   Ht 5' 4.5" (1.638 m)   Wt 176 lb 1.9 oz (79.9 kg)   SpO2 94%   BMI 29.76 kg/m  Physical Exam Vitals and nursing note reviewed.  Constitutional:      General: She is not in acute distress.    Appearance: Normal appearance. She is not ill-appearing or toxic-appearing.  HENT:     Head: Normocephalic and atraumatic.     Right Ear: Hearing, tympanic membrane, ear canal and external ear normal.     Left Ear: Hearing, tympanic membrane, ear canal and external ear normal.     Mouth/Throat:     Pharynx: Oropharynx is clear.  Eyes:     Extraocular Movements: Extraocular movements intact.     Pupils: Pupils are equal, round, and reactive to light.  Neck:     Thyroid: No thyroid mass, thyromegaly or thyroid tenderness.     Vascular: No carotid bruit.  Cardiovascular:     Rate and Rhythm: Normal rate and regular rhythm. No extrasystoles are present.    Pulses:          Dorsalis pedis pulses are 2+ on the right side and 2+ on the left side.     Heart sounds: Normal heart sounds. No murmur heard.    No friction rub. No gallop.  Pulmonary:     Effort:  Pulmonary effort is normal.     Breath sounds: Normal breath sounds. No decreased breath sounds, wheezing, rhonchi or rales.  Chest:     Chest wall: No mass.  Breasts:    Right: Normal. No swelling, mass or tenderness.     Left: Normal. No swelling, mass or tenderness.  Abdominal:     Palpations: Abdomen is soft. There is no hepatomegaly, splenomegaly or mass.     Tenderness: There is no abdominal tenderness.     Hernia: No hernia is present.  Genitourinary:    Comments: Pelvic deferred due to age   Musculoskeletal:     Cervical back: Normal range of motion.     Right lower leg: No edema.     Left lower  leg: No edema.  Lymphadenopathy:     Cervical: No cervical adenopathy.     Upper Body:     Right upper body: No supraclavicular adenopathy.     Left upper body: No supraclavicular adenopathy.  Skin:    General: Skin is warm and dry.  Neurological:     General: No focal deficit present.     Mental Status: She is alert and oriented to person, place, and time. Mental status is at baseline.     Sensory: Sensation is intact.     Motor: Motor function is intact. No weakness.     Deep Tendon Reflexes: Reflexes are normal and symmetric.  Psychiatric:        Attention and Perception: Attention normal.        Mood and Affect: Mood normal.        Speech: Speech normal.        Behavior: Behavior normal.        Thought Content: Thought content normal.        Cognition and Memory: Cognition normal.        Judgment: Judgment normal.   Most Recent Functional Status Assessment:    09/28/2023   10:01 AM  In your present state of health, do you have any difficulty performing the following activities:  Hearing? 0  Vision? 0  Difficulty concentrating or making decisions? 0  Walking or climbing stairs? 0  Dressing or bathing? 0  Doing errands, shopping? 0  Preparing Food and eating ? N  Using the Toilet? N  In the past six months, have you accidently leaked urine? N  Do you have  problems with loss of bowel control? N  Managing your Medications? N  Managing your Finances? N  Housekeeping or managing your Housekeeping? N   Most Recent Fall Risk Assessment:    09/28/2023   10:06 AM  Fall Risk   Falls in the past year? 0  Number falls in past yr: 0  Injury with Fall? 0  Risk for fall due to : No Fall Risks  Follow up Falls evaluation completed   Most Recent Depression Screenings:    09/28/2023   10:07 AM 10/05/2022   10:40 AM  PHQ 2/9 Scores  PHQ - 2 Score 0 0   Most Recent Cognitive Screening:    09/28/2023   10:08 AM  6CIT Screen  What Year? 4 points  What month? 3 points  What time? 3 points  Count back from 20 4 points  Months in reverse 4 points  Repeat phrase 10 points  Total Score 28 points   Results:  Studies Obtained And Personally Reviewed By Me:  PAP Smear deferred due to age - last done in 2021 was normal.   Mammogram 01/26/2023  normal.  Colonoscopy 05/03/2017 normal throughout the entire examined colon.  Bone Density 06/27/2023  T-score Forearm Radius -1.2, osteopenic.  Labs:     Component Value Date/Time   NA 143 09/24/2023 0932   K 5.0 09/24/2023 0932   CL 103 09/24/2023 0932   CO2 32 09/24/2023 0932   GLUCOSE 95 09/24/2023 0932   BUN 16 09/24/2023 0932   CREATININE 0.88 09/24/2023 0932   CALCIUM 10.1 09/24/2023 0932   PROT 7.2 09/24/2023 0932   ALBUMIN 4.3 02/20/2017 1055   AST 22 09/24/2023 0932   ALT 16 09/24/2023 0932   ALKPHOS 114 02/20/2017 1055   BILITOT 0.4 09/24/2023 0932   GFRNONAA 66 09/09/2020 0905   GFRAA 76 09/09/2020 0905  Lab Results  Component Value Date   WBC 7.8 09/24/2023   HGB 13.9 09/24/2023   HCT 41.3 09/24/2023   MCV 91.2 09/24/2023   PLT 322 09/24/2023   Lab Results  Component Value Date   CHOL 177 09/24/2023   HDL 47 (L) 09/24/2023   LDLCALC 109 (H) 09/24/2023   TRIG 106 09/24/2023   CHOLHDL 3.8 09/24/2023   Lab Results  Component Value Date   HGBA1C 5.4 03/10/2022     Lab Results  Component Value Date   TSH 2.84 09/24/2023    Assessment & Plan:   Orders Placed This Encounter  Procedures   POCT URINALYSIS DIP (CLINITEK)  Other Labs Reviewed today: CBC: WNL CMP: WNL  TSH: 2.84  Hypertension treated with Amlodipine 5 mg, Losartan 100 mg daily, and Metoprolol tartrate 12.5 mg twice daily. Blood Pressure: normotensive today at 138/82.  Hyperlipidemia treated with  Simvastatin 20 mg nightly. 09/24/2023 Lipid Panel, compared to 08/2022: HDL 47, decreased from 55; LDL 109, elevated from 79.  Migraine Headaches treated with Fioricet 50-325-40 mg, 1 - 2 tablets every 12 hours as needed. and Elavil 20 mg at bedtime.   Anxiety treated with Xanax 0.25 mg at bedtime as needed.   Vitreous Detachment, Left Eye in 2017 managed with eyeglasses.   History of Right Thalamic Stroke in 2014; Subsequent Musculoskeletal Pain w/ persistent left upper extremity numbness treated with Flexeril 5 - 10 mg as needed and in the past has used Hydrocodone APAP tablet occasionally.   Started seeing Dr. Kristen Petri, Allergist in 11/2022. Skin test positive for grasses and dog dander. Spirometry normal. Seasonal/Perennial Allergic Rhinitis managed with Flonase 2 sprays into both nostrils daily, Allegra Allergy 180 mg daily, and Astelin 0.1 nasal spray twice into both nostrils as needed.   PAP Smear deferred due to age - last done in 2021 was normal.   Mammogram 01/26/2023  normal  with repeat recommendation of 2025.  Colonoscopy 05/03/2017 normal throughout the entire examined colon with repeat recommendation of 2028.  Bone Density 06/27/2023  T-score Forearm Radius -1.2, osteopenic.   Osteopenia managed with Calcium daily and Vitamin-D 5000 units daily.  Vaccine Counseling: Due for UTD on Flu, Shingles 1/2, PNA, and Tdap.   Annual wellness visit done today including the all of the following: Reviewed patient's Family Medical History Reviewed and updated list of patient's  medical providers Assessment of cognitive impairment was done Assessed patient's functional ability Established a written schedule for health screening services Health Risk Assessent Completed and Reviewed  Discussed health benefits of physical activity, and encouraged her to engage in regular exercise appropriate for her age and condition.    I,Emily Lagle,acting as a Neurosurgeon for Sylvan Evener, MD.,have documented all relevant documentation on the behalf of Sylvan Evener, MD,as directed by  Sylvan Evener, MD while in the presence of Sylvan Evener, MD.   I, Sylvan Evener, MD, have reviewed all documentation for this visit. The documentation on 10/01/23 for the exam, diagnosis, procedures, and orders are all accurate and complete.

## 2023-09-26 ENCOUNTER — Other Ambulatory Visit: Payer: Self-pay | Admitting: Internal Medicine

## 2023-09-28 ENCOUNTER — Ambulatory Visit: Payer: Medicare Other | Admitting: Internal Medicine

## 2023-09-28 VITALS — BP 138/82 | HR 77 | Temp 98.2°F | Ht 64.5 in | Wt 176.1 lb

## 2023-09-28 DIAGNOSIS — E7800 Pure hypercholesterolemia, unspecified: Secondary | ICD-10-CM

## 2023-09-28 DIAGNOSIS — Z0000 Encounter for general adult medical examination without abnormal findings: Secondary | ICD-10-CM | POA: Diagnosis not present

## 2023-09-28 DIAGNOSIS — I6930 Unspecified sequelae of cerebral infarction: Secondary | ICD-10-CM

## 2023-09-28 DIAGNOSIS — F419 Anxiety disorder, unspecified: Secondary | ICD-10-CM

## 2023-09-28 DIAGNOSIS — Z8669 Personal history of other diseases of the nervous system and sense organs: Secondary | ICD-10-CM | POA: Diagnosis not present

## 2023-09-28 DIAGNOSIS — I10 Essential (primary) hypertension: Secondary | ICD-10-CM

## 2023-09-28 DIAGNOSIS — J301 Allergic rhinitis due to pollen: Secondary | ICD-10-CM

## 2023-09-28 DIAGNOSIS — J3081 Allergic rhinitis due to animal (cat) (dog) hair and dander: Secondary | ICD-10-CM

## 2023-09-28 LAB — POCT URINALYSIS DIP (CLINITEK)
Bilirubin, UA: NEGATIVE
Blood, UA: NEGATIVE
Glucose, UA: NEGATIVE mg/dL
Ketones, POC UA: NEGATIVE mg/dL
Leukocytes, UA: NEGATIVE
Nitrite, UA: NEGATIVE
POC PROTEIN,UA: NEGATIVE
Spec Grav, UA: 1.015 (ref 1.010–1.025)
Urobilinogen, UA: 0.2 U/dL
pH, UA: 7.5 (ref 5.0–8.0)

## 2023-10-01 ENCOUNTER — Encounter: Payer: Self-pay | Admitting: Internal Medicine

## 2023-10-01 NOTE — Patient Instructions (Addendum)
 It was a pleasure to see you today. Continue current meds as prescribed. Return in one year or as needed. Labs are stable.

## 2023-10-08 ENCOUNTER — Other Ambulatory Visit: Payer: Self-pay | Admitting: Internal Medicine

## 2023-10-08 ENCOUNTER — Other Ambulatory Visit: Payer: Self-pay

## 2023-12-17 ENCOUNTER — Encounter: Payer: Self-pay | Admitting: Internal Medicine

## 2023-12-17 ENCOUNTER — Ambulatory Visit (INDEPENDENT_AMBULATORY_CARE_PROVIDER_SITE_OTHER): Admitting: Internal Medicine

## 2023-12-17 ENCOUNTER — Other Ambulatory Visit: Payer: Self-pay | Admitting: Internal Medicine

## 2023-12-17 VITALS — BP 110/70 | HR 86 | Temp 99.6°F | Ht 65.0 in | Wt 172.8 lb

## 2023-12-17 DIAGNOSIS — Z1231 Encounter for screening mammogram for malignant neoplasm of breast: Secondary | ICD-10-CM

## 2023-12-17 DIAGNOSIS — J020 Streptococcal pharyngitis: Secondary | ICD-10-CM

## 2023-12-17 DIAGNOSIS — J029 Acute pharyngitis, unspecified: Secondary | ICD-10-CM | POA: Diagnosis not present

## 2023-12-17 LAB — POCT RAPID STREP A (OFFICE): Rapid Strep A Screen: POSITIVE — AB

## 2023-12-17 MED ORDER — AMOXICILLIN-POT CLAVULANATE 875-125 MG PO TABS: 1.0000 | ORAL_TABLET | Freq: Two times a day (BID) | ORAL | 0 refills | Status: DC

## 2023-12-17 MED ORDER — FLUCONAZOLE 150 MG PO TABS: 150.0000 mg | ORAL_TABLET | Freq: Once | ORAL | 0 refills | Status: AC

## 2023-12-17 MED ORDER — HYDROCODONE BIT-HOMATROP MBR 5-1.5 MG/5ML PO SOLN: 5.0000 mL | Freq: Three times a day (TID) | ORAL | 0 refills | Status: DC | PRN

## 2023-12-17 NOTE — Patient Instructions (Addendum)
 Take Hycodan sparingly for cough or sore throat pain. Take Augmentin  twice daily with a meal for 10 days. Diflucan  sent in to take if you develop yeast infection on antibiotics. Rest and stay well hydrated. This is contagious to others.

## 2023-12-17 NOTE — Progress Notes (Signed)
 Patient Care Team: Perri Ronal PARAS, MD as PCP - General (Internal Medicine)  Visit Date: 12/17/23  Subjective:   Chief Complaint  Patient presents with   Sore Throat    Started Friday. Gargled with salt water soothed for a little but comes back. Not much runny nose or coughing, nofevers.    Patient PI:Tracie Roach,Female DOB:[birth year],68 y.o. FMW:[id number]   68 y.o.Female presents today for acute sick visit with Sore Throat. Patient has a past medical history of Strep Throat. Says that her sore throat started Friday, has been expectorating thick, discolored sputum (yellow to dark green), and has had a dull headache. Denies rhinorrhea, coughing, nausea/vomiting, or fever/chills.   Past Medical History:  Diagnosis Date   Allergy     Anxiety    Arthritis    Cataract    beginnings of    Essential tremor    Functional constipation    Heart murmur    Hyperlipidemia    Hypertension    Stroke (HCC) 2014    Allergies  Allergen Reactions   Grass Pollen([id number]-O-[id number]-T-Swt Vern)     Grass    Family History  Problem Relation Age of Onset   Allergic rhinitis Mother    Heart disease Mother    Eczema Sister    Asthma Paternal Uncle    Eczema Paternal Grandfather    Colon cancer Neg Hx    Colon polyps Neg Hx    Esophageal cancer Neg Hx    Rectal cancer Neg Hx    Stomach cancer Neg Hx    Breast cancer Neg Hx    Social History   Social History Narrative          Social history: She is married with 2 adult children.  Several grandchildren.  Does not smoke or consume alcohol.  Quit smoking in March 2013.  Used to smoke 6 or 7 cigarettes daily.  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 Patient has 2 years of community college.       Family history: Father died in Jan 05, 2008 with pulmonary fibrosis.  Mother is living but doing poorly with history of CABG and valve replacement.  1 sister in good health.  No  brothers.   Review of Systems  Constitutional:  Negative for chills, fever and malaise/fatigue.  HENT:  Positive for congestion and sore throat.   Respiratory:  Positive for cough and sputum production (discolored, yellow/green/dark green).   Gastrointestinal:  Negative for nausea and vomiting.  Neurological:  Positive for headaches.     Objective:  Vitals: BP 110/70   Pulse 86   Temp 99.6 F (37.6 C) (Temporal)   Ht 5' 5 (1.651 m)   Wt 172 lb 12.8 oz (78.4 kg)   SpO2 97%   BMI 28.76 kg/m   Physical Exam Vitals and nursing note reviewed.  Constitutional:      General: She is not in acute distress.    Appearance: Normal appearance. She is not ill-appearing.  HENT:     Head: Normocephalic and atraumatic.     Right Ear: Ear canal and external ear normal. Tympanic membrane is injected. Tympanic membrane is not erythematous.     Left Ear: Ear canal and external ear normal. Tympanic membrane is injected. Tympanic membrane is not erythematous.     Ears:     Comments: (+) Right TM dull    Mouth/Throat:     Mouth: Mucous membranes are  moist.     Pharynx: Oropharynx is clear. Posterior oropharyngeal erythema present. No oropharyngeal exudate.  Pulmonary:     Effort: Pulmonary effort is normal.     Breath sounds: Normal breath sounds. No wheezing, rhonchi or rales.  Lymphadenopathy:     Cervical: No cervical adenopathy.   Skin:    General: Skin is warm and dry.   Neurological:     Mental Status: She is alert and oriented to person, place, and time. Mental status is at baseline.   Psychiatric:        Mood and Affect: Mood normal.        Behavior: Behavior normal.        Thought Content: Thought content normal.        Judgment: Judgment normal.     Results:  Studies Obtained And Personally Reviewed By Me: Labs:     Component Value Date/Time   NA 143 09/24/2023 0932   K 5.0 09/24/2023 0932   CL 103 09/24/2023 0932   CO2 32 09/24/2023 0932   GLUCOSE 95 09/24/2023 0932    BUN 16 09/24/2023 0932   CREATININE 0.88 09/24/2023 0932   CALCIUM 10.1 09/24/2023 0932   PROT 7.2 09/24/2023 0932   ALBUMIN 4.3 02/20/2017 1055   AST 22 09/24/2023 0932   ALT 16 09/24/2023 0932   ALKPHOS 114 02/20/2017 1055   BILITOT 0.4 09/24/2023 0932   GFRNONAA 66 09/09/2020 0905   GFRAA 76 09/09/2020 0905    Lab Results  Component Value Date   WBC 7.8 09/24/2023   HGB 13.9 09/24/2023   HCT 41.3 09/24/2023   MCV 91.2 09/24/2023   PLT 322 09/24/2023   Lab Results  Component Value Date   CHOL 177 09/24/2023   HDL 47 (L) 09/24/2023   LDLCALC 109 (H) 09/24/2023   TRIG 106 09/24/2023   CHOLHDL 3.8 09/24/2023   Lab Results  Component Value Date   HGBA1C 5.4 03/10/2022    Lab Results  Component Value Date   TSH 2.84 09/24/2023    Results for orders placed or performed in visit on 12/17/23  POCT rapid strep A  Result Value Ref Range   Rapid Strep A Screen Positive (A) Negative   Assessment & Plan:   Orders Placed This Encounter  Procedures   POCT rapid strep A   Meds ordered this encounter  Medications   HYDROcodone  bit-homatropine (HYCODAN) 5-1.5 MG/5ML syrup    Sig: Take 5 mLs by mouth every 8 (eight) hours as needed for cough.    Dispense:  120 mL    Refill:  0   amoxicillin -clavulanate (AUGMENTIN ) 875-125 MG tablet    Sig: Take 1 tablet by mouth 2 (two) times daily.    Dispense:  20 tablet    Refill:  0   fluconazole  (DIFLUCAN ) 150 MG tablet    Sig: Take 1 tablet (150 mg total) by mouth once for 1 dose.    Dispense:  1 tablet    Refill:  0   Streptococcal Pharyngitis: Positive strep test in office today, throat erythematous on exam, no exudates. Sending in 500 mg Augmentin  - take 1 capsule (875-125 mg total) by mouth 2 (two) times daily for 10 days, and 150 mg Diflucan  in case she develops Candida vaginitis. Contact us  if symptoms worsen/persist despite treatment. Hycodan for sore throat pain or cough. Take one tsp every 6-8 hours as needed.      I,Emily Lagle,acting as a Neurosurgeon for Ronal JINNY Hailstone, MD.,have documented all  relevant documentation on the behalf of Ronal JINNY Hailstone, MD,as directed by  Ronal JINNY Hailstone, MD while in the presence of Ronal JINNY Hailstone, MD.   I, Ronal JINNY Hailstone, MD, have reviewed all documentation for this visit. The documentation on 12/17/23 for the exam, diagnosis, procedures, and orders are all accurate and complete.

## 2023-12-20 ENCOUNTER — Other Ambulatory Visit: Payer: Self-pay | Admitting: Internal Medicine

## 2023-12-28 ENCOUNTER — Other Ambulatory Visit: Payer: Self-pay | Admitting: Internal Medicine

## 2024-01-08 ENCOUNTER — Other Ambulatory Visit: Payer: Self-pay | Admitting: Family

## 2024-01-12 ENCOUNTER — Other Ambulatory Visit: Payer: Self-pay | Admitting: Family

## 2024-01-28 ENCOUNTER — Ambulatory Visit
Admission: RE | Admit: 2024-01-28 | Discharge: 2024-01-28 | Disposition: A | Discharge status: Home / Self Care | Source: Ambulatory Visit | Attending: Internal Medicine | Admitting: Internal Medicine

## 2024-01-28 DIAGNOSIS — Z1231 Encounter for screening mammogram for malignant neoplasm of breast: Secondary | ICD-10-CM

## 2024-02-15 ENCOUNTER — Encounter: Payer: Self-pay | Admitting: Internal Medicine

## 2024-02-15 ENCOUNTER — Other Ambulatory Visit: Payer: Self-pay

## 2024-02-15 ENCOUNTER — Ambulatory Visit (INDEPENDENT_AMBULATORY_CARE_PROVIDER_SITE_OTHER): Payer: Medicare Other | Admitting: Internal Medicine

## 2024-02-15 VITALS — BP 114/74 | HR 83 | Temp 98.5°F | Ht 65.25 in | Wt 172.9 lb

## 2024-02-15 DIAGNOSIS — J302 Other seasonal allergic rhinitis: Secondary | ICD-10-CM | POA: Diagnosis not present

## 2024-02-15 DIAGNOSIS — J3089 Other allergic rhinitis: Secondary | ICD-10-CM | POA: Diagnosis not present

## 2024-02-15 DIAGNOSIS — B999 Unspecified infectious disease: Secondary | ICD-10-CM

## 2024-02-15 MED ORDER — FLUTICASONE PROPIONATE 50 MCG/ACT NA SUSP: 2.0000 | Freq: Every day | NASAL | 1 refills | Status: AC

## 2024-02-15 MED ORDER — FEXOFENADINE HCL 180 MG PO TABS: 180.0000 mg | ORAL_TABLET | Freq: Every day | ORAL | 1 refills | Status: AC

## 2024-02-15 MED ORDER — AZELASTINE HCL 0.1 % NA SOLN: 2.0000 | Freq: Two times a day (BID) | NASAL | 1 refills | Status: AC | PRN

## 2024-02-15 NOTE — Progress Notes (Signed)
   FOLLOW UP Date of Service/Encounter:  02/15/24   Subjective:  Tracie Roach (DOB: [birth year]) is a 68 y.o. female who [facility] for follow up for allergic rhinitis and recurrent URI.   History obtained from: chart review and patient. Last seen by me 08/03/2023: discussed keeping track of infections and abx use and not sure if she truly had Strep as she had a cough.  AR with positive testing to dogs/grasses, use Flonase /Azelastine /Allegra .  Seen by PCP 11/2023 for sore throat, a little cough, headache.  Rapid strep positive, started on augmentin . This is her second strep infection. Denies hx of skin/gi/deep infections.  No hx of IV abx or hospitalization for infections.   Reports some stuffy nose with ear fullness and pressure but doing better. Using Flonase  and Allegra  daily with PRN Azelastine .   Past Medical History: Past Medical History:  Diagnosis Date   Allergy     Anxiety    Arthritis    Cataract    beginnings of    Essential tremor    Functional constipation    Heart murmur    Hyperlipidemia    Hypertension    Stroke (HCC) 2014    Objective:  BP 114/74 (BP Location: Left Arm, Patient Position: Sitting, Cuff Size: Normal)   Pulse 83   Temp 98.5 F (36.9 C) (Temporal)   Ht 5' 5.25 (1.657 m)   Wt 172 lb 14.4 oz (78.4 kg)   SpO2 96%   BMI 28.55 kg/m  Body mass index is 28.55 kg/m. Physical Exam: GEN: alert, well developed HEENT: clear conjunctiva, nose with mild inferior turbinate hypertrophy, pink nasal mucosa, no rhinorrhea, no cobblestoning HEART: regular rate and rhythm, no murmur LUNGS: clear to auscultation bilaterally, no coughing, unlabored respiration SKIN: no rashes or lesions  Assessment:   1. Recurrent infections   2. Seasonal and perennial allergic rhinitis     Plan/Recommendations:  Allergic Rhinitis:  - Improved, some symptoms still, can use INAH as add on.  - Positive skin test 11/2022: grasses,  dogs  - Use nasal saline rinses before nose sprays such as with Neilmed Sinus Rinse.  Use distilled water.   - Use Flonase  2 sprays each nostril daily. Aim upward and outward. - Use Azelastine  2 sprays each nostril twice daily as needed for congestion/drainage/sneezing/runny nose.  Aim upward and outward.   - Use Allegra  180mg  daily . - Consider allergy  shots as long term control of your symptoms by teaching your immune system to be more tolerant of your allergy  triggers  Recurrent Infections - Keep track of infection history and use of antibiotic use. - Will check Ig and vaccine titers today in setting of recurrent strep infections.  - For any future sore throat infections, please obtain throat culture prior to treatment and if positive, please check one 2 weeks after completion of antibiotics.  I am wondering if she is a chronic strep carrier or are these truly strep infections (since she has a cough with sore throat episodes and therefore may be viral infections).       Return in about 6 months (around 08/16/2024).  Arleta Blanch, MD [facility]

## 2024-02-15 NOTE — Patient Instructions (Addendum)
 Allergic Rhinitis:  - Positive skin test 11/2022: grasses, dogs  - Use nasal saline rinses before nose sprays such as with Neilmed Sinus Rinse.  Use distilled water.   - Use Flonase  2 sprays each nostril daily. Aim upward and outward. - Use Azelastine  2 sprays each nostril twice daily as needed for congestion/drainage/sneezing/runny nose.  Aim upward and outward.   - Use Allegra  180mg  daily . - Consider allergy  shots as long term control of your symptoms by teaching your immune system to be more tolerant of your allergy  triggers  Recurrent Infections - Keep track of infection history and use of antibiotic use. - For any future sore throat infections, please obtain throat culture prior to treatment and if positive, please check one 2 weeks after completion of antibiotics.  I am wondering if she is a chronic strep carrier or are these truly strep infections.

## 2024-02-19 DIAGNOSIS — H524 Presbyopia: Secondary | ICD-10-CM | POA: Diagnosis not present

## 2024-02-19 DIAGNOSIS — H43813 Vitreous degeneration, bilateral: Secondary | ICD-10-CM | POA: Diagnosis not present

## 2024-02-19 DIAGNOSIS — H52223 Regular astigmatism, bilateral: Secondary | ICD-10-CM | POA: Diagnosis not present

## 2024-02-19 DIAGNOSIS — H353131 Nonexudative age-related macular degeneration, bilateral, early dry stage: Secondary | ICD-10-CM | POA: Diagnosis not present

## 2024-02-19 DIAGNOSIS — H16421 Pannus (corneal), right eye: Secondary | ICD-10-CM | POA: Diagnosis not present

## 2024-02-19 DIAGNOSIS — H2513 Age-related nuclear cataract, bilateral: Secondary | ICD-10-CM | POA: Diagnosis not present

## 2024-02-21 ENCOUNTER — Other Ambulatory Visit: Payer: Self-pay | Admitting: Internal Medicine

## 2024-02-22 LAB — STREP PNEUMONIAE 23 SEROTYPES IGG
Pneumo Ab Type 1*: 1.6 ug/mL (ref >1.3)
Pneumo Ab Type 12 (12F)*: 0.2 ug/mL — AB (ref >1.3)
Pneumo Ab Type 14*: 4.5 ug/mL (ref >1.3)
Pneumo Ab Type 17 (17F)*: 0.3 ug/mL — AB (ref >1.3)
Pneumo Ab Type 19 (19F)*: 2.6 ug/mL (ref >1.3)
Pneumo Ab Type 2*: 0.6 ug/mL — AB (ref >1.3)
Pneumo Ab Type 20*: 2.7 ug/mL (ref >1.3)
Pneumo Ab Type 22 (22F)*: 1.6 ug/mL (ref >1.3)
Pneumo Ab Type 23 (23F)*: 1 ug/mL — AB (ref >1.3)
Pneumo Ab Type 26 (6B)*: 2.5 ug/mL (ref >1.3)
Pneumo Ab Type 3*: 0.2 ug/mL — AB (ref >1.3)
Pneumo Ab Type 34 (10A)*: 21 ug/mL (ref >1.3)
Pneumo Ab Type 4*: 0.5 ug/mL — AB (ref >1.3)
Pneumo Ab Type 43 (11A)*: 1.8 ug/mL (ref >1.3)
Pneumo Ab Type 5*: 1 ug/mL — AB (ref >1.3)
Pneumo Ab Type 51 (7F)*: 1.7 ug/mL (ref >1.3)
Pneumo Ab Type 54 (15B)*: 19.9 ug/mL (ref >1.3)
Pneumo Ab Type 56 (18C)*: 1.7 ug/mL (ref >1.3)
Pneumo Ab Type 57 (19A)*: 2.5 ug/mL (ref >1.3)
Pneumo Ab Type 68 (9V)*: 2.2 ug/mL (ref >1.3)
Pneumo Ab Type 70 (33F)*: 0.9 ug/mL — AB (ref >1.3)
Pneumo Ab Type 8*: 8.8 ug/mL (ref >1.3)
Pneumo Ab Type 9 (9N)*: 0.8 ug/mL — AB (ref >1.3)

## 2024-02-22 LAB — IGG, IGA, IGM
IgA/Immunoglobulin A, Serum: 195 mg/dL (ref 87–352)
IgG (Immunoglobin G), Serum: 1061 mg/dL (ref 586–1602)
IgM (Immunoglobulin M), Srm: 137 mg/dL (ref 26–217)

## 2024-02-22 LAB — DIPHTHERIA / TETANUS ANTIBODY PANEL
Diphtheria Ab: 0.22 [IU]/mL (ref <0.10)
Tetanus Ab, IgG: 1.04 [IU]/mL (ref <0.10)

## 2024-03-05 ENCOUNTER — Ambulatory Visit: Payer: Self-pay | Admitting: Internal Medicine

## 2024-03-05 ENCOUNTER — Other Ambulatory Visit: Payer: Self-pay | Admitting: Internal Medicine

## 2024-03-05 DIAGNOSIS — B999 Unspecified infectious disease: Secondary | ICD-10-CM

## 2024-03-05 NOTE — Progress Notes (Signed)
 I called and left voicemail for a return call in regards to her lab results.

## 2024-03-05 NOTE — Progress Notes (Signed)
 Future order lab for S pneumo titers after vaccine.
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
## 2024-03-18 ENCOUNTER — Other Ambulatory Visit: Payer: Self-pay | Admitting: Internal Medicine

## 2024-03-29 ENCOUNTER — Other Ambulatory Visit: Payer: Self-pay | Admitting: Internal Medicine

## 2024-04-02 DIAGNOSIS — K080 Exfoliation of teeth due to systemic causes: Secondary | ICD-10-CM | POA: Diagnosis not present

## 2024-04-11 ENCOUNTER — Other Ambulatory Visit: Payer: Self-pay | Admitting: Family

## 2024-04-11 ENCOUNTER — Other Ambulatory Visit: Payer: Self-pay | Admitting: Internal Medicine

## 2024-04-16 ENCOUNTER — Other Ambulatory Visit: Payer: Self-pay

## 2024-04-16 MED ORDER — METOPROLOL TARTRATE 25 MG PO TABS: 12.5000 mg | ORAL_TABLET | Freq: Two times a day (BID) | ORAL | 1 refills | Status: DC

## 2024-04-23 DIAGNOSIS — K080 Exfoliation of teeth due to systemic causes: Secondary | ICD-10-CM | POA: Diagnosis not present

## 2024-07-15 ENCOUNTER — Ambulatory Visit: Payer: Self-pay

## 2024-07-15 ENCOUNTER — Telehealth: Admitting: Internal Medicine

## 2024-07-15 ENCOUNTER — Encounter: Payer: Self-pay | Admitting: Internal Medicine

## 2024-07-15 DIAGNOSIS — J309 Allergic rhinitis, unspecified: Secondary | ICD-10-CM | POA: Diagnosis not present

## 2024-07-15 DIAGNOSIS — J0100 Acute maxillary sinusitis, unspecified: Secondary | ICD-10-CM | POA: Diagnosis not present

## 2024-07-15 MED ORDER — AMOXICILLIN-POT CLAVULANATE 875-125 MG PO TABS: 1.0000 | ORAL_TABLET | Freq: Two times a day (BID) | ORAL | 0 refills | Status: AC

## 2024-07-15 MED ORDER — HYDROCODONE BIT-HOMATROP MBR 5-1.5 MG/5ML PO SOLN: 5.0000 mL | Freq: Three times a day (TID) | ORAL | 0 refills | Status: AC | PRN

## 2024-07-15 MED ORDER — FLUCONAZOLE 150 MG PO TABS: 150.0000 mg | ORAL_TABLET | Freq: Once | ORAL | 0 refills | Status: AC

## 2024-07-15 NOTE — Telephone Encounter (Signed)
 FYI Only or Action Required?: FYI only for provider: appointment scheduled on 1/27.  Patient was last seen in primary care on 12/17/2023 by Perri Ronal PARAS, MD.  Called Nurse Triage reporting Facial Pain and Cough.  Symptoms began several days ago.  Interventions attempted: OTC medications: Allegra , flonase , mucinex.  Symptoms are: gradually worsening.  Triage Disposition: See HCP Within 4 Hours (Or PCP Triage)  Patient/caregiver understands and will follow disposition?: Yes   "Sunday onset of 9/10 headache, coughing with green mucus, sinus pressure. No SOB or CP. Temp 99.3 F temporal today. Reports hx of sinus infections with same symptoms. Scheduled virtual appt with PCP today d/t winter weather. Advised UC or ED for worsening symptoms.    Message from Zy'onna H sent at 07/15/2024  9:55 AM EST  Reason for Triage: Patient is having symptoms of Sinus Infection - Sneezing - Coughing - Green Mucus  ** Looking for appt or medication prior to is going into Pneumonia.  **Please Advise**   Reason for Disposition  [1] SEVERE sinus pain (e.g., excruciating) AND [2] not improved 2 hours after pain medicine  Answer Assessment - Initial Assessment Questions 1. LOCATION: Where does it hurt?      Sinuses and head  2. ONSET: When did the sinus pain start?  (e.g., hours, days)      Sunday  3. SEVERITY: How bad is the pain?   (Scale 0-10; or none, mild, moderate or severe)     9/10  4. RECURRENT SYMPTOM: Have you ever had sinus problems before? If Yes, ask: When was the last time? and What happened that time?      Yes, normally takes abxs and cough syrup and mucinex  5. NASAL CONGESTION: Is the nose blocked? If Yes, ask: Can you open it or must you breathe through your mouth?     Congested  6. NASAL DISCHARGE: Do you have discharge from your nose? If so ask, What color?     Green mucus  7. FEVER: Do you have a fever? If Yes, ask: What is it, how was it  measured, and when did it start?      99" .3 F today temporal  8. OTHER SYMPTOMS: Do you have any other symptoms? (e.g., sore throat, cough, earache, difficulty breathing)     Cough with green mucus  Protocols used: Sinus Pain or Congestion-A-AH

## 2024-07-15 NOTE — Patient Instructions (Signed)
 Patient has hx of recurrent sinusitis. Has been seen by Allergist. Today has acute maxillary sinusitis and will be prescribed Augmentin  875 mg twice daily x 10 days. Hycodan one tsp every 8 hours as needed for cough. Diflucan  150 mg tablet to take if develops Candida vaginitis while on antibiotics.

## 2024-07-15 NOTE — Progress Notes (Signed)
 "   Patient Care Team: Perri Ronal PARAS, MD as PCP - General (Internal Medicine)  I connected with Tracie Roach on 07/15/24 at 11:17 AM by video enabled telemedicine visit and verified that I am speaking with the correct person using two identifiers.   I discussed the limitations, risks, security and privacy concerns of performing an evaluation and management service by telemedicine and the availability of in-person appointments. I also discussed with the patient that there may be a patient responsible charge related to this service. The patient expressed understanding and agreed to proceed.   Other persons participating in the visit and their role in the encounter: Medical scribe, Nestora JAYSON Bathe  Patients location: Home  Providers location: Clinic   I provided 10 minutes of face-to-face video visit time during this encounter, and > 50% was spent counseling as documented under my assessment & plan.   Chief Complaint: Sinus congestion   Subjective:    Patient ID: Tracie Roach , Female    DOB: 09-01-1955, 69 y.o.    MRN: [id number]   69 y.o. Female presents today for Sinus Congestion. Patient has a past medical history of Hypertension, Hyperlipidemia, Migraine.  She said that she has been sick since Sunday. She has been experiencing sinus congestion, headache and a productive cough. She also took her temperature this morning and had a low grade fever. She denies having body aches but says that unable to get warm. She denies being around anybody was sick but says daughter was sick two weeks ago.   History of Allergic rhinitis treated with Flonase , Azelastine , Allegra , Nasal saline rinse. AR with positive testing to dogs/grasses. She last saw the allergist 02/15/2024.   Vaccine counseling: UTD on influenza vaccine.     Past Medical History:  Diagnosis Date   Allergy     Anxiety    Arthritis    Cataract    beginnings of    Essential tremor    Functional constipation    Heart  murmur    Hyperlipidemia    Hypertension    Stroke [facility]) 2014     Family History  Problem Relation Age of Onset   Allergic rhinitis Mother    Heart disease Mother    Eczema Sister    Asthma Paternal Uncle    Eczema Paternal Grandfather    Colon cancer Neg Hx    Colon polyps Neg Hx    Esophageal cancer Neg Hx    Rectal cancer Neg Hx    Stomach cancer Neg Hx    Breast cancer Neg Hx     Social History   Social History Narrative          Social history: She is married with 2 adult children.  Several grandchildren.  Does not smoke or consume alcohol.  Quit smoking in March 2013.  Used to smoke 6 or 7 cigarettes daily.  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 Patient has 2 years of community college.       Family history: Father died in 2007-08-10 with pulmonary fibrosis.  Mother is living but doing poorly with history of CABG and valve replacement.  1 sister in good health.  No brothers.      Review of Systems  Constitutional:  Positive for fever and malaise/fatigue.  HENT:  Positive for congestion.   Respiratory:  Positive for cough and sputum production.   Neurological:  Positive for headaches.  Objective:   Vitals: No vital signs taken by pt at home.  seen virtually in NAD. No tachypneia but sounds nasally congested   Physical Exam    Results:   Studies obtained and personally reviewed by me:    Labs:       Component Value Date/Time   NA 143 09/24/2023 0932   K 5.0 09/24/2023 0932   CL 103 09/24/2023 0932   CO2 32 09/24/2023 0932   GLUCOSE 95 09/24/2023 0932   BUN 16 09/24/2023 0932   CREATININE 0.88 09/24/2023 0932   CALCIUM 10.1 09/24/2023 0932   PROT 7.2 09/24/2023 0932   ALBUMIN 4.3 02/20/2017 1055   AST 22 09/24/2023 0932   ALT 16 09/24/2023 0932   ALKPHOS 114 02/20/2017 1055   BILITOT 0.4 09/24/2023 0932   GFRNONAA 66 09/09/2020 0905   GFRAA 76  09/09/2020 0905     Lab Results  Component Value Date   WBC 7.8 09/24/2023   HGB 13.9 09/24/2023   HCT 41.3 09/24/2023   MCV 91.2 09/24/2023   PLT 322 09/24/2023    Lab Results  Component Value Date   CHOL 177 09/24/2023   HDL 47 (L) 09/24/2023   LDLCALC 109 (H) 09/24/2023   TRIG 106 09/24/2023   CHOLHDL 3.8 09/24/2023    Lab Results  Component Value Date   HGBA1C 5.4 03/10/2022     Lab Results  Component Value Date   TSH 2.84 09/24/2023        Assessment & Plan:   Meds ordered this encounter  Medications   amoxicillin -clavulanate (AUGMENTIN ) 875-125 MG tablet    Sig: Take 1 tablet by mouth 2 (two) times daily.    Dispense:  20 tablet    Refill:  0   fluconazole  (DIFLUCAN ) 150 MG tablet    Sig: Take 1 tablet (150 mg total) by mouth once for 1 dose.    Dispense:  1 tablet    Refill:  0   HYDROcodone  bit-homatropine (HYCODAN) 5-1.5 MG/5ML syrup    Sig: Take 5 mLs by mouth every 8 (eight) hours as needed for cough.    Dispense:  120 mL    Refill:  0    Acute non-recurrent maxillary sinusitis: She said that she has been sick since Sunday. She has been experiencing sinus congestion, headache and a productive cough. She also took her temperature this morning and had a low grade fever. She denies having body aches but says that unable to get warm. She denies being around anybody was sick but says daughter was sick two weeks ago.   Augmentin  875-125 mg twice daily for 10 days prescribed.   Hycodan One teaspoon every 8 hours as needed for cough prescribed.   Diflucan  150 mg prescribed to take if develops Candida vaginitis while on antibiotics.  Allergic rhinitis: treated with Flonase , Azelastine , Allegra , Nasal saline rinse. AR with positive testing to dogs/grasses. She last saw the allergist 02/15/2024.   Vaccine counseling: UTD on influenza vaccine.   I,Makayla C Reid,acting as a scribe for Ronal JINNY Hailstone, MD.,have documented all relevant documentation on the behalf  of Ronal JINNY Hailstone, MD,as directed by  Ronal JINNY Hailstone, MD while in the presence of Ronal JINNY Hailstone, MD.  I, Ronal JINNY Hailstone, MD, have reviewed all documentation for this visit. The documentation on 07/15/2024 for the exam, diagnosis, procedures, and orders are all accurate and complete.   "

## 2024-07-16 ENCOUNTER — Other Ambulatory Visit: Payer: Self-pay | Admitting: Internal Medicine

## 2024-08-08 ENCOUNTER — Ambulatory Visit: Admitting: Internal Medicine

## 2024-09-29 ENCOUNTER — Other Ambulatory Visit

## 2024-10-06 ENCOUNTER — Other Ambulatory Visit: Payer: Self-pay

## 2024-10-10 ENCOUNTER — Ambulatory Visit: Payer: Self-pay | Admitting: Internal Medicine
